# Patient Record
Sex: Male | Born: 1940 | Race: White | Hispanic: No | Marital: Married | Smoking: Former smoker
Health system: Southern US, Community
[De-identification: ages and names within clinical notes are randomized; demographics above are authoritative.]

## PROBLEM LIST (undated history)

## (undated) DIAGNOSIS — E785 Hyperlipidemia, unspecified: Secondary | ICD-10-CM

## (undated) DIAGNOSIS — I10 Essential (primary) hypertension: Secondary | ICD-10-CM

## (undated) DIAGNOSIS — I219 Acute myocardial infarction, unspecified: Secondary | ICD-10-CM

## (undated) DIAGNOSIS — I2510 Atherosclerotic heart disease of native coronary artery without angina pectoris: Secondary | ICD-10-CM

## (undated) DIAGNOSIS — IMO0001 Reserved for inherently not codable concepts without codable children: Secondary | ICD-10-CM

## (undated) DIAGNOSIS — Z531 Procedure and treatment not carried out because of patient's decision for reasons of belief and group pressure: Secondary | ICD-10-CM

## (undated) DIAGNOSIS — Z789 Other specified health status: Secondary | ICD-10-CM

## (undated) DIAGNOSIS — K56600 Partial intestinal obstruction, unspecified as to cause: Secondary | ICD-10-CM

## (undated) DIAGNOSIS — H353110 Nonexudative age-related macular degeneration, right eye, stage unspecified: Secondary | ICD-10-CM

## (undated) DIAGNOSIS — I639 Cerebral infarction, unspecified: Secondary | ICD-10-CM

## (undated) DIAGNOSIS — M1990 Unspecified osteoarthritis, unspecified site: Secondary | ICD-10-CM

## (undated) HISTORY — DX: Atherosclerotic heart disease of native coronary artery without angina pectoris: I25.10

## (undated) HISTORY — DX: Hyperlipidemia, unspecified: E78.5

## (undated) HISTORY — PX: TONSILLECTOMY: SUR1361

## (undated) HISTORY — DX: Essential (primary) hypertension: I10

## (undated) HISTORY — PX: APPENDECTOMY: SHX54

## (undated) HISTORY — DX: Other specified health status: Z78.9

---

## 1998-02-24 DIAGNOSIS — I219 Acute myocardial infarction, unspecified: Secondary | ICD-10-CM

## 1998-02-24 HISTORY — DX: Acute myocardial infarction, unspecified: I21.9

## 1998-10-16 ENCOUNTER — Inpatient Hospital Stay (HOSPITAL_COMMUNITY): Admission: EM | Admit: 1998-10-16 | Discharge: 1998-10-19 | Payer: Self-pay | Admitting: Cardiology

## 1999-03-07 ENCOUNTER — Encounter: Payer: Self-pay | Admitting: Cardiology

## 1999-03-07 ENCOUNTER — Ambulatory Visit (HOSPITAL_COMMUNITY): Admission: RE | Admit: 1999-03-07 | Discharge: 1999-03-07 | Payer: Self-pay | Admitting: Cardiology

## 1999-03-09 ENCOUNTER — Inpatient Hospital Stay (HOSPITAL_COMMUNITY): Admission: EM | Admit: 1999-03-09 | Discharge: 1999-03-12 | Payer: Self-pay | Admitting: Emergency Medicine

## 1999-03-09 ENCOUNTER — Encounter: Payer: Self-pay | Admitting: Emergency Medicine

## 2000-01-06 ENCOUNTER — Inpatient Hospital Stay (HOSPITAL_COMMUNITY): Admission: AD | Admit: 2000-01-06 | Discharge: 2000-01-11 | Payer: Self-pay | Admitting: Internal Medicine

## 2000-01-06 ENCOUNTER — Encounter: Payer: Self-pay | Admitting: Internal Medicine

## 2000-01-08 ENCOUNTER — Encounter: Payer: Self-pay | Admitting: Internal Medicine

## 2000-01-09 ENCOUNTER — Encounter: Payer: Self-pay | Admitting: *Deleted

## 2000-04-06 ENCOUNTER — Inpatient Hospital Stay (HOSPITAL_COMMUNITY): Admission: AD | Admit: 2000-04-06 | Discharge: 2000-04-07 | Payer: Self-pay | Admitting: Cardiovascular Disease

## 2003-02-25 HISTORY — PX: COLONOSCOPY W/ BIOPSIES AND POLYPECTOMY: SHX1376

## 2004-01-30 ENCOUNTER — Ambulatory Visit (HOSPITAL_COMMUNITY): Admission: RE | Admit: 2004-01-30 | Discharge: 2004-01-30 | Payer: Self-pay | Admitting: Internal Medicine

## 2004-01-30 ENCOUNTER — Ambulatory Visit: Payer: Self-pay | Admitting: Internal Medicine

## 2004-02-28 ENCOUNTER — Ambulatory Visit: Payer: Self-pay | Admitting: Cardiology

## 2004-03-18 ENCOUNTER — Ambulatory Visit: Payer: Self-pay | Admitting: Cardiology

## 2004-10-14 ENCOUNTER — Ambulatory Visit: Payer: Self-pay | Admitting: Cardiology

## 2005-05-20 ENCOUNTER — Ambulatory Visit: Payer: Self-pay | Admitting: *Deleted

## 2005-12-08 ENCOUNTER — Emergency Department (HOSPITAL_COMMUNITY): Admission: EM | Admit: 2005-12-08 | Discharge: 2005-12-08 | Payer: Self-pay | Admitting: Emergency Medicine

## 2006-01-07 ENCOUNTER — Ambulatory Visit: Payer: Self-pay | Admitting: Cardiovascular Disease

## 2006-01-20 ENCOUNTER — Ambulatory Visit: Payer: Self-pay | Admitting: Cardiovascular Disease

## 2006-01-20 ENCOUNTER — Encounter (HOSPITAL_COMMUNITY): Admission: RE | Admit: 2006-01-20 | Discharge: 2006-02-19 | Payer: Self-pay | Admitting: Cardiovascular Disease

## 2006-06-30 ENCOUNTER — Ambulatory Visit: Payer: Self-pay | Admitting: Cardiovascular Disease

## 2007-01-06 ENCOUNTER — Ambulatory Visit: Payer: Self-pay | Admitting: Cardiovascular Disease

## 2008-01-05 ENCOUNTER — Ambulatory Visit: Payer: Self-pay | Admitting: Cardiology

## 2008-07-17 DIAGNOSIS — E785 Hyperlipidemia, unspecified: Secondary | ICD-10-CM | POA: Insufficient documentation

## 2008-07-18 ENCOUNTER — Encounter (INDEPENDENT_AMBULATORY_CARE_PROVIDER_SITE_OTHER): Payer: Self-pay | Admitting: *Deleted

## 2008-07-18 LAB — CONVERTED CEMR LAB
ALT: 18 units/L
Bilirubin, Direct: 0.2 mg/dL
Cholesterol: 152 mg/dL
Glucose, Bld: 115 mg/dL
HDL: 39 mg/dL
Triglycerides: 141 mg/dL

## 2008-07-19 ENCOUNTER — Ambulatory Visit: Payer: Self-pay | Admitting: Cardiology

## 2009-01-09 ENCOUNTER — Encounter (INDEPENDENT_AMBULATORY_CARE_PROVIDER_SITE_OTHER): Payer: Self-pay | Admitting: *Deleted

## 2009-01-09 ENCOUNTER — Ambulatory Visit: Payer: Self-pay | Admitting: Cardiology

## 2009-01-09 DIAGNOSIS — I10 Essential (primary) hypertension: Secondary | ICD-10-CM

## 2009-01-09 DIAGNOSIS — I2510 Atherosclerotic heart disease of native coronary artery without angina pectoris: Secondary | ICD-10-CM

## 2009-03-02 ENCOUNTER — Encounter (INDEPENDENT_AMBULATORY_CARE_PROVIDER_SITE_OTHER): Payer: Self-pay

## 2009-03-14 ENCOUNTER — Encounter: Payer: Self-pay | Admitting: Gastroenterology

## 2009-03-15 ENCOUNTER — Ambulatory Visit: Payer: Self-pay | Admitting: Gastroenterology

## 2009-03-15 ENCOUNTER — Ambulatory Visit (HOSPITAL_COMMUNITY): Admission: RE | Admit: 2009-03-15 | Discharge: 2009-03-15 | Payer: Self-pay | Admitting: Gastroenterology

## 2009-07-17 ENCOUNTER — Encounter: Payer: Self-pay | Admitting: Cardiology

## 2009-07-24 ENCOUNTER — Ambulatory Visit: Payer: Self-pay | Admitting: Cardiology

## 2009-07-25 ENCOUNTER — Encounter: Payer: Self-pay | Admitting: Cardiology

## 2010-01-31 ENCOUNTER — Ambulatory Visit: Payer: Self-pay | Admitting: Cardiology

## 2010-01-31 ENCOUNTER — Encounter: Payer: Self-pay | Admitting: Adult Health

## 2010-03-26 NOTE — Assessment & Plan Note (Signed)
Summary: Z6X      Allergies Added: ! PCN  Visit Type:  6 month follow up Primary Provider:  Dr. Catalina Pizza   History of Present Illness: [name] [name] is a very friendly 70 y/o CM we are following with known history of stent to the third diagonal, LAD 2002; f/u stress myoview 2007 which was negative for ischemia, hypertension, hypercholesterolemia who is here for 6 month follow-up.  He is without complaints of angina.  He remains active and has not required the use of NTG.  He states he feels great.  He is followed by Dr. Margo Aye for cholesterol status.  Current Medications (verified): 1)  Aspirin Ec 325 Mg Tbec (Aspirin) .... Take One Tablet By Mouth Daily 2)  Cozaar 50 Mg Tabs (Losartan Potassium) .... Take One Tablet By Mouth Daily 3)  Pravastatin Sodium 40 Mg Tabs (Pravastatin Sodium) .... Take One Tablet By Mouth Daily At Bedtime 4)  Metoprolol Tartrate 50 Mg Tabs (Metoprolol Tartrate) .... Take 1/2 Tab Two Times A Day 5)  Multivitamins  Tabs (Multiple Vitamin) .... Take 1 Tablet By Mouth Once A Day 6)  Fibertab 625 Mg Tabs (Calcium Polycarbophil) .... 2 Tablets By Mouth Daily  Allergies (verified): 1)  ! Pcn  Vital Signs:  Patient profile:   70 year old male Height:      69 inches Weight:      188 pounds BMI:     27.86 O2 Sat:      96 % on Room air Temp:     97.3 degrees F oral Pulse rate:   56 / minute BP sitting:   121 / 67  (left arm)  Vitals Entered By: Dreama Saa, CNA (January 31, 2010 1:04 PM)  O2 Flow:  Room air   Impression & Recommendations:  Problem # 1:  ESSENTIAL HYPERTENSION, BENIGN (ICD-401.1) Well controlled.  No medication changes. His updated medication list for this problem includes:    Aspirin Ec 325 Mg Tbec (Aspirin) .Marland Kitchen... Take one tablet by mouth daily    Cozaar 50 Mg Tabs (Losartan potassium) .Marland Kitchen... Take one tablet by mouth daily    Metoprolol Tartrate 50 Mg Tabs (Metoprolol tartrate) .Marland Kitchen... Take 1/2 tab two times a day  Problem # 2:  CORONARY  ATHEROSCLEROSIS NATIVE CORONARY ARTERY (ICD-414.01) He is asymptomatic.  Remains active.   His updated medication list for this problem includes:    Aspirin Ec 325 Mg Tbec (Aspirin) .Marland Kitchen... Take one tablet by mouth daily    Metoprolol Tartrate 50 Mg Tabs (Metoprolol tartrate) .Marland Kitchen... Take 1/2 tab two times a day  Patient Instructions: 1)  Your physician recommends that you schedule a follow-up appointment in: 6 months

## 2010-03-26 NOTE — Letter (Signed)
Summary: LABS 05-14-09  LABS 05-14-09   Imported By: Faythe Ghee 07/17/2009 13:45:04  _____________________________________________________________________  External Attachment:    Type:   Image     Comment:   External Document  Appended Document: LABS 05-14-09 Reviewed.

## 2010-03-26 NOTE — Letter (Signed)
Summary: Recall, Screening Colonoscopy Only  [facility]  [address]   [place], [place] [address]   Phone: [phone]  Fax: [phone]    March 02, 2009  [name] [name] [address] [place], [place]  [address] 1940/06/05   Dear [name],   Our records indicate it is time to schedule your colonoscopy.    Please call our office at [phone] and ask for the nurse.   Thank you,  Hendricks Limes, LPN Cloria Spring, LPN  [facility] Ph: [phone]   Fax: [phone]

## 2010-03-26 NOTE — Letter (Signed)
Summary: TRIAGE  TRIAGE   Imported By: Diana Eves 03/14/2009 13:47:13  _____________________________________________________________________  External Attachment:    Type:   Image     Comment:   External Document

## 2010-03-26 NOTE — Assessment & Plan Note (Signed)
Summary: F6E      Allergies Added: NKDA  Visit Type:  Follow-up Primary Provider:  Dr. Catalina Pizza   History of Present Illness: 70 year old male presents for followup. He denies any interval development of angina or unusual breathlessness. He reports compliance with medications.  Followup labs from March 2011 revealed potassium 4.6, BUN 20, creatinine 0.8, AST 15, ALT 12, total cholesterol 146, triglycerides 89, HDL 40, LDL 88.  He remains comfortable with observation only at this point, and does not want to pursue any followup ischemic testing, unless he develops progressive symptoms. I have discussed this with him with him before.   Current Medications (verified): 1)  Aspirin Ec 325 Mg Tbec (Aspirin) .... Take One Tablet By Mouth Daily 2)  Cozaar 50 Mg Tabs (Losartan Potassium) .... Take One Tablet By Mouth Daily 3)  Pravastatin Sodium 40 Mg Tabs (Pravastatin Sodium) .... Take One Tablet By Mouth Daily At Bedtime 4)  Metoprolol Tartrate 50 Mg Tabs (Metoprolol Tartrate) .... Take 1/2 Tab Two Times A Day 5)  Multivitamins  Tabs (Multiple Vitamin) .... Take 1 Tablet By Mouth Once A Day 6)  Fibertab 625 Mg Tabs (Calcium Polycarbophil) .... 2 Tablets By Mouth Daily  Allergies (verified): No Known Drug Allergies  Past History:  Past Medical History: Last updated: 01/09/2009 CAD - PTCA/stent to LAD and diagonal 2001, LVEF 55% Hyperlipidemia Hypertension ACE inhibitor intolerance related to cough  Social History: Last updated: 01/09/2009 Retired - Medical laboratory scientific officer Divorced  Tobacco Use - No.   Review of Systems  The patient denies anorexia, fever, chest pain, syncope, dyspnea on exertion, peripheral edema, prolonged cough, headaches, melena, hematochezia, and severe indigestion/heartburn.         Otherwise reviewed and negative.  Vital Signs:  Patient profile:   70 year old male Weight:      179 pounds Pulse rate:   56 / minute BP sitting:   120 / 70  (right arm)  Vitals  Entered By: Dreama Saa, CNA (Jul 24, 2009 2:36 PM)  Physical Exam  Additional Exam:  Normally nourished appearing male in no acute distress. HEENT: Conjunctiva and lids normal, oropharynx with moist mucosa. Neck: Supple, no carotid bruits, no thyromegaly. Lungs: Clear to auscultation, nonlabored. Cardiac: Regular rate and rhythm, no significant systolic murmur or S3 gallop. Abdomen: Soft, no hepatomegaly, bowel sounds present. Skin: Warm and dry. Extremities: No pitting edema. Distal pulses 2+.   EKG  Procedure date:  07/24/2009  Findings:      Sinus bradycardia at 53 beats per minutes with decreased R-wave progression consistent with old anterior infarct, incomplete right bundle branch block pattern.  Impression & Recommendations:  Problem # 1:  CORONARY ATHEROSCLEROSIS NATIVE CORONARY ARTERY (ICD-414.01)  Symptomatically stable on medical therapy. Mr. Sanroman prefers observation only at this time. We will keep 6 month followup visits for now, although I have asked him to let me know sooner if he develops any interval angina or unusual shortness of breath.  His updated medication list for this problem includes:    Aspirin Ec 325 Mg Tbec (Aspirin) .Marland Kitchen... Take one tablet by mouth daily    Metoprolol Tartrate 50 Mg Tabs (Metoprolol tartrate) .Marland Kitchen... Take 1/2 tab two times a day  Problem # 2:  ESSENTIAL HYPERTENSION, BENIGN (ICD-401.1)  Blood pressure well controlled today.  His updated medication list for this problem includes:    Aspirin Ec 325 Mg Tbec (Aspirin) .Marland Kitchen... Take one tablet by mouth daily    Cozaar 50 Mg Tabs (Losartan  potassium) .Marland Kitchen... Take one tablet by mouth daily    Metoprolol Tartrate 50 Mg Tabs (Metoprolol tartrate) .Marland Kitchen... Take 1/2 tab two times a day  His updated medication list for this problem includes:    Aspirin Ec 325 Mg Tbec (Aspirin) .Marland Kitchen... Take one tablet by mouth daily    Cozaar 50 Mg Tabs (Losartan potassium) .Marland Kitchen... Take one tablet by mouth daily     Metoprolol Tartrate 50 Mg Tabs (Metoprolol tartrate) .Marland Kitchen... Take 1/2 tab two times a day  Problem # 3:  DYSLIPIDEMIA (ICD-272.4)  Lipid numbers look reasonable.  His updated medication list for this problem includes:    Pravastatin Sodium 40 Mg Tabs (Pravastatin sodium) .Marland Kitchen... Take one tablet by mouth daily at bedtime  Patient Instructions: 1)  Your physician recommends that you schedule a follow-up appointment in: 6 months 2)  Your physician recommends that you continue on your current medications as directed. Please refer to the Current Medication list given to you today.

## 2010-07-09 NOTE — Assessment & Plan Note (Signed)
[facility]                        CARDIOLOGY OFFICE NOTE   NAME:[name] [name], [name] [name]                      MRN:          [id number]  DATE:07/19/2008                            DOB:          Oct 31, 1940    PRIMARY CARE PHYSICIAN:  [name] A. Gerda Diss, MD   REASON FOR VISIT:  Scheduled followup.   HISTORY OF PRESENT ILLNESS:  I saw [name] [name] back in November.  He is  doing very well.  He states that he is continuing to walk and not having  any problems with angina or breathlessness.  Today's electrocardiogram  shows sinus bradycardia with decreased R-wave progression which is old.  I reviewed his medications today which are unchanged.  He did have  recent labs done showing an LDL of 85.  Random glucose was 115.  We  talked about low-carbohydrate, low simple sugar diet, and perhaps  increasing his walking regiment some.  Otherwise, his liver function  tests were normal.   ALLERGIES:  PENICILLIN.   MEDICATIONS:  1. Aspirin 325 mg p.o. daily.  2. Cozaar 50 mg p.o. daily.  3. Pravastatin 40 mg p.o. daily.  4. Lopressor 25 mg p.o. b.i.d.  5. Sublingual nitroglycerin 0.4 mg p.r.n. (has not needed).   REVIEW OF SYSTEMS:  Outlined above.  No claudication symptoms.  Otherwise, reviewed negative.   PHYSICAL EXAMINATION:  VITAL SIGNS:  Blood pressure 114/64, heart rate  66, weight is 189 pounds down from 195.  GENERAL:  The patient is comfortable in no acute distress.  NECK:  No elevated jugular venous pressure.  No loud carotid bruits.  LUNGS:  Clear without breathing.  CARDIAC:  Regular rate and rhythm.  No S3 gallop.  Soft basal systolic  murmur.  ABDOMEN:  Soft.  EXTREMITIES:  Exhibit no significant pitting edema.   IMPRESSION AND RECOMMENDATIONS:  1. Coronary artery disease, status post previous intervention of the      left anterior descending and diagonal branch back in 2001.  He is      symptomatically quite stable at this point.  He had a  nonischemic      Myoview in 2007.  We will plan to continue medical therapy and      symptom observation over the next 6 months.  2. Hyperlipidemia, LDL of 85, on pravastatin.  Liver function tests      were normal.  3. Hypertension, well controlled today.     [name] Sidle, MD  Electronically Signed    SGM/MedQ  DD: 07/19/2008  DT: 07/20/2008  Job #: [id number]   cc:   [name] Picket A. Gerda Diss, MD

## 2010-07-09 NOTE — Assessment & Plan Note (Signed)
[facility]                       [place] CARDIOLOGY OFFICE NOTE   NAME:[name] [name], [name] [name]                      MRN:          [id number]  DATE:01/05/2008                            DOB:          03/14/1940    PRIMARY CARE PHYSICIAN:  Scott A. Gerda Diss, MD.   REASON FOR VISIT:  Cardiac followup.   HISTORY OF PRESENT ILLNESS:  Mr. Harshfield was last seen by Dr. Eden Emms back  in November 2008.  He has a history of coronary artery disease, status  post angioplasty and stent placement in the left anterior descending and  second diagonal branch in 2001.  He also had moderate disease involving  a right coronary artery.  She had a very reassuring Myoview done in  November 2007 demonstrating no active ischemia or scar with an ejection  fraction of 55% and from a clinical perspective is doing very well  without angina or breathlessness.  He has not had recent lipids and is  due to see his primary care physician tomorrow for some followup lab  work.  He is tolerating well the medications outlined below.   ALLERGIES:  PENICILLIN.   PRESENT MEDICATIONS:  1. Aspirin 325 mg p.o. daily.  2. Cozaar 50 mg p.o. daily.  3. Pravastatin 40 mg p.o. daily.  4. Metoprolol 50 mg one-half tablet p.o. b.i.d.  5. Nitroglycerin 0.4 mg sublingual p.r.n.   REVIEW OF SYSTEMS:  As described in history of present illness.  No  claudication, palpitation, or syncope; otherwise negative.   PHYSICAL EXAMINATION:  VITAL SIGNS:  Blood pressure is 126/78, heart  rate is 68, and weight is 195 pounds.  GENERAL:  The patient is comfortable, in no acute distress.  NECK:  No elevated jugular venous pressure.  No loud bruits or  thyromegaly.  LUNGS:  Clear on breathing at rest.  CARDIAC:  Regular rate and rhythm.  No S3, gallop, or pathologic murmur.  He does have a soft systolic ejection murmur.  ABDOMEN:  Soft,  nontender.  EXTREMITIES:  Exhibit no significant pitting edema.   IMPRESSION  AND RECOMMENDATIONS:  1. Cardiovascular disease, status post previous intervention to the      left anterior descending and diagonal back in 2001 with a      nonischemic Myoview in 2007.  He is doing very well symptomatically      on medical therapy.  I will plan to see him back in 6 months.  2. Hyperlipidemia, on pravastatin.  LDL was 78 back in 2007.  He is      due for followup labs with      Dr. Gerda Diss in the near future.  His goal LDL should remain around      70.  3. Hypertension, well controlled today.  He is tolerating Cozaar.     Jonelle Sidle, MD  Electronically Signed    SGM/MedQ  DD: 01/05/2008  DT: 01/06/2008  Job #: [id number]   cc:   Lorin Picket A. Gerda Diss, MD

## 2010-07-09 NOTE — Assessment & Plan Note (Signed)
[facility]                       [place] CARDIOLOGY OFFICE NOTE   NAME:[name] [name], [name] [name]                      MRN:          [id number]  DATE:01/06/2007                            DOB:          02-27-1940    [name] [name] returns today for followup.  He is doing fairly well.  He has not  had any significant chest pain, PND, or orthopnea.  He has a history of  coronary artery disease with previous angioplasty and stenting of the  LAD in 2000 and 2001.   His last Myoview in December, 2007 was nonischemic with an EF of 55%.  He had been complaining about the cost of his Cozaar before I tried to  switch him to Benazepril, and he got a cough.  This had to be stopped by  Dr. Gerda Diss.   Since that time, he apparently has had some talks with Medicare part D  and was able to get his Cozaar for $5.60.   His review of systems is otherwise negative.  He is relieved.  He is  finally getting a divorce from his wife, who apparently was running  around on him quite a bit.   CURRENT MEDICATIONS:  1. Aspirin daily.  2. Cozaar 50 daily.  3. Pravastatin 40 a day.  4. Metoprolol 25 b.i.d.   PHYSICAL EXAMINATION:  Remarkable for a jovial elderly white male in no  distress.  Weight is 185.  Blood pressure is 130/70.  Pulse is 68 and regular.  Afebrile.  Respiratory rate 14.  HEENT:  Unremarkable.  Carotids are normal without bruit.  No lymphadenopathy, thyromegaly, or  JVP elevation.  LUNGS:  Clear with good diaphragmatic motion.  No wheezing.  CARDIAC:  S1 and S2, normal heart sounds, no murmur.  PMI normal.  ABDOMEN:  Benign.  Bowel sounds positive.  No hepatosplenomegaly or  hepatojugular reflux.  Distal pulses are intact with no edema.  NEURO:  Nonfocal.  SKIN:  Warm and dry.  No muscular weakness.   IMPRESSION:  1. Previous coronary artery disease with stenting to the left anterior      descending artery, nonischemic Myoview in 2007.  No chest pain.  Continue aspirin and beta blocker.  2. Risk factor modification, hypercholesterolemia.  Continue      pravastatin 40 mg a day.  Last LDL was 78 with normal LFTs.  3. Hypertension:  Continue Cozaar.  Appears to have a cough with ACE      inhibitors.  Continue low salt diet.  Can always increase Cozaar to      100 a day if needed.   Overall, I think [name] [name] is doing well, and I congratulated him on  resolving some of the issues with his estranged wife.     Noralyn Pick. Eden Emms, MD, [facility]  Electronically Signed    PCN/MedQ  DD: 01/06/2007  DT: 01/07/2007  Job #: [id number]

## 2010-07-12 NOTE — Assessment & Plan Note (Signed)
HEALTHCARE                         [place] CARDIOLOGY OFFICE NOTE   NAME:[name], [name] GASPARINI                      MRN:          [id number]  DATE:01/07/2006                            DOB:          Nov 14, 1940    [name] [name] is seen as a new patient by me.  He has previously been followed  by Dr. Dorethea Clan.  He has a history of coronary artery disease, with  angioplasty and stenting of his LAD and second diagonal branch in 2001.  His  EF was thought to be in the 40% range.  He has also had some in-stent  restenosis of the LAD.  The patient was noted also to have a 60%-70% right  coronary artery lesion.   His last catheterization in 2002 showed an EF of 53%, with patent stents.   The patient has not had a stress test in over four years.   REVIEW OF SYSTEMS:  Remarkable for an occasional headache.  He does not have  any nitroglycerin at home.  He has not had any PND, orthopnea, syncope, or  lower extremity edema.   He is now on social security benefits.  He is active.  He used to work in a  [employer].   MEDICATIONS:  1. An aspirin a day.  2. Lipitor 20 at bedtime.  3. Cozaar 50 mg a day.   His last LDL cholesterol from December 02, 2005 was 78.   His LFTs were mildly abnormal, with a bilirubin of 1.9, indirect being  elevated at 1.6.  His enzymes themselves were negative.   He had a 2D echocardiogram in June 2005 which showed an EF of 50-55%, with  no critical valve disease.   PHYSICAL EXAMINATION:  VITAL SIGNS:  Blood pressure was equal to 110/70,  pulse 60 and regular.  HEENT:  Normal.  NECK:  There were no carotid bruits.  There is no thyromegaly.  There is no  lymphadenopathy.  LUNGS:  Clear.  There is an S1, S2, with a soft systolic ejection murmur.  ABDOMEN:  Benign.  LOWER EXTREMITIES:  Intact pulses.  No edema.  SKIN:  Warm and dry.  NEUROLOGIC:  Nonfocal.   IMPRESSION:  Coronary artery disease.  Previous stenting of the LAD and   diagonal, with moderate right disease.  Need for follow-up adenosine  Myoview.  The patient can walk for this.  I gave him prescriptions for  NitroQuick to have and told him he should get this refilled every year.  I  will see him the week after Thanksgiving to do his treadmill.   His cholesterol is fairly well controlled on Lipitor 20.  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  I think we will keep  his current statin dose the same and follow up his lipids in six months.   His blood pressure is well controlled on Cozaar 50 mg, and he has previously  had decreased LV function, which seems to have recovered some.   Overall, I think he is doing well.  Further recommendations will be based on  the results of  his Myoview.     [name] [name]. Eden Emms, MD, [facility]  Electronically Signed    PCN/MedQ  DD: 01/07/2006  DT: 01/07/2006  Job #: [id number]

## 2010-07-12 NOTE — Discharge Summary (Signed)
[place]  Patient:    [name] [name], CARTON                      MRN: [id number] Adm. Date:  [id number] Disc. Date: 01/11/00 Attending:  Pricilla Riffle Dictator:   Tereso Newcomer, P.A. CC:         Madolyn Frieze. Jens Som, M.D. [facility], [address], [place], [place] [address]             Dr. Sudie Bailey   Discharge Summary  DATE OF BIRTH:  29-Oct-1940  REASON FOR ADMISSION:  Neck and shoulder pain.  DISCHARGE DIAGNOSES: 1. Coronary artery disease. 2. History of remote tobacco abuse. 3. Hyperlipidemia. 4. 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  PROCEDURES: 1. Cardiac catheterization on January 07, 2000, by Dr. Glennon Hamilton revealing    left main normal, LAD 30% in-stent restenosis proximal LAD diagonal    reveals 70% restenosis at the ostium and also proximal circumflex normal    RCA focal 50 to 70% lesion, in distal RCA no response to intracoronary    nitroglycerin.  Left ventricle EF 65%, hypokinesis of distal anterior wall.    Abdominal aortogram normal. 2. Exercise Cardiolite on January 09, 2000, revealed inferior wall ischemia 3. Percutaneous coronary intervention on January 10, 2000, by Dr. Veneda Melter status post PTCA and stent to RCA reduction of stenosis from 70% to    0%.  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 Sudie Bailey in the office on the day of admission.  He had been having severe pain in his neck and shoulder recently.  His color got bad.  His wife gave the patient nitroglycerin the night before admission, and the pain resolved.  He went to see Dr. Sudie Bailey who sent him to the emergency room for admission. Previous cardiac ischemic pain was in his arms.  His chest pain was rated at 5/10; it had been 9/10.   Positive diaphoresis, positive shortness of breath, positive dizziness.  No nausea.  ALLERGIES:  PENICILLIN.  INITIAL PHYSICAL EXAMINATION:  GENERAL:  A 70 year old male in no acute distress.  VITAL SIGNS:  Blood pressure 145/82.  NECK:  Without bruits or JVD.  HEART:  Regular rate and rhythm, distant.  LUNGS:  Clear.  EXTREMITIES:  Without edema.  ABDOMEN:  Soft, obese, nontender.  ADMISSION LABORATORY DATA:  EKG: Sinus bradycardia with rate of 51; no new changes.  HOSPITAL COURSE:  The patient was admitted for neck and shoulder pain.  This was felt to be an anginal equivalent, and he was sent for cardiac catheterization on January 07, 2000, by Dr. Glennon Hamilton.  The results are noted above.  He tolerated the procedure well.  He had no immediate complications.  Given the RCA lesion, it was felt he should be evaluated with a Cardiolite study.  This was performed on January 09, 2000.  The results are noted above.  Given the results of ischemia in inferior wall, it was felt the patient should undergo percutaneous coronary intervention of the RCA lesion. This was performed on January 10, 2000.  He had no immediate complications and remained stable post catheterization.  On the morning of January 11, 2000, he was  without complaints, and his groin was stable without hematomas or bruits.  Therefore, it was felt he was stable enough for discharge to home.  LABORATORY DATA:  White count 5700, hemoglobin 11.5, hematocrit 30.2, platelet count 156,000.  Sodium 140, potassium 4, chloride 100, CO2 29, BUN 14, creatinine 0.8, glucose 114.  Cardiac enzymes negative x 3.  Lipid profile: Total cholesterol 145, triglycerides 62, HDL 30, LDL 103.  Total protein 6.9, albumin 4.1, AST 20, ALT 20, alkaline phosphatase 64, total bilirubin 1.6, and that was high.  Chest x-ray on January 08, 2000: Minimal peribronchial thickening with no other significant abnormality.  Cervical spine  x-ray: Degenerative changes in the mid lower cervical spine.  DISCHARGE MEDICATIONS: 1. Plavix 75 mg q.d. x 1 month. 2. Coated aspirin 325 mg q.d. 3. PROVE-IT study drug q.d. 4. Cozaar 25 mg q.d. 5. Lopressor 25 mg b.i.d. 6. Nitroglycerin 0.4 mg sublingual p.r.n. chest pain.  ACTIVITY:  No driving, heavy lifting, exertion, or sex for three days.  DIET:  Low-fat, low-salt diet.  WOUND CARE:  The patient should watch his groin for increased swelling, bleeding or bruising.  Call office with concerns.  He is to return back to work on Monday with no heavy lifting.  The PROVE-IT study drug will be started next week, and research nurse will call the patient and make arrangements.  FOLLOWUP:  He is to follow up with Dr. Jens Som in two weeks in [place]. DD:  01/11/00 TD:  01/11/00 Job: [id number]

## 2010-07-12 NOTE — Cardiovascular Report (Signed)
[place]. [facility]  Patient:    [name] [name]                       MRN: [id number] Proc. Date: 03/07/99 Adm. Date:  [id number] Attending:  Lenoria Farrier CC:         Veneda Melter, M.D. [facility]             Madolyn Frieze. Jens Som, M.D. [facility]             Butch Penny, M.D.             Bruce Elvera Lennox Juanda Chance, M.D. [facility]                        Cardiac Catheterization  PROCEDURES PERFORMED: 1. Left heart catheterization. 2. Right heart catheterization. 3. Left ventriculogram. 4. Intravascular ultrasound of the left anterior descending artery. 5. Percutaneous transluminal coronary angioplasty of the left anterior descending    artery. 6. Percutaneous transluminal coronary angioplasty of the second diagonal    branch of the left anterior descending artery. 7. Perclose suture closure, right femoral artery.  DIAGNOSES: 1. 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 2. Mild left ventricular systolic dysfunction. 3. Normal right heart pressures.  HISTORY:  Mr. 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 The patient underwent insertion of a stent in the mid LAD at the takeoff of a second diagonal branch.  At that time, he had anterior wall hypokinesis.  The patient recovered nicely, however, he had a recurrent episode of discomfort and  subsequently underwent a Cardiolite stress test.  This showed scarring in the distal, anterior, and apical walls with a moderate amount of peri-infarct ischemia.  He presents now for right and left heart catheterization.  TECHNIQUE:  After informed consent was obtained, the patient was brought to the  catheterization laboratory where both groins were sterilely prepped and draped.  One percent Lidocaine was used to infiltrate the right groin.  An 8-French and  6-French arterial sheath were placed using the modified  Seldinger technique.  A  7.5-French PA catheter was then advanced via the right femoral vein into the pulmonary artery and appropriate right-sided hemodynamics were obtained.  A 6-French pigtail catheter was advanced into the left ventricle and appropriate left-sided hemodynamics were obtained.  A left ventriculogram was then performed using power injections of contrast.  The pigtail catheter was removed and 6-French JL-4 and JR-4 catheters were used to engage the left and right coronary arteries. Selective angiography was performed in various projection using manual injections of contrast.  The initial findings are as follows.  INITIAL FINDINGS:  HEMODYNAMICS:  R 8, RV 21/8, PA 21/13, pulmonary capillary wedge pressure is 12. LV 112/10, aortic 113/53, LV EDP 20.  Cardiac output 4.7 L/min by thermodilution, cardiac index of 2.44 L/min/m sq.  Aortic saturation 95%, PA saturation 77.4%.  Cardiac output 6.0 L/min by Fick, cardiac index 3.1 L/min/m sq.  Mitral valve gradient is 3.3 mmHg.  CORONARY ANGIOGRAPHY: 1. Left main trunk:  Mild irregularities with narrowings not greater than 20%.  2. Left anterior descending artery:  This is a large caliber vessel that provides    two diagonal branches in the proximal segment.  The first diagonal branch is a    trivial sized vessel with an ostial narrowing of 90%.  The second diagonal    branch  is a medium caliber vessel that has mild diffuse disease in the ostium    and proximal segment of approximately 70%.  The LAD is a large caliber vessel    that has mild diffuse disease in the proximal segment with narrowings of 30%.    There is a hazy narrowing of 70% in the mid section of the LAD within the distal    segment of the previously placed stent.  This is at the takeoff of the second    diagonal branch.  The distal LAD has mild irregularities.  3. Left circumflex artery:  This is a medium caliber vessel that provides a large     first marginal branch in the mid section and a smaller second marginal branch    distally.  There are mild irregularities in the left circumflex system.  4. Right coronary artery:  The RCA is dominant.  It is a large caliber vessel that    provides a posterior descending artery as well as several posteroventricular    branches in its terminal segment.  There is a moderate narrowing of    approximately 70% in the distal RCA prior to the takeoff of the posterior    descending artery.  The remainder of the RCA has mild diffuse irregularities.  LEFT VENTRICULOGRAM:  Normal end-systolic and systolic dimensions.  Overall, left ventricular function is mildly impaired.  Ejection fraction is approximately 40%. There is severe hypokinesis of the mid and distal anterior wall and apex. There was no mitral regurgitation.  Upon review of these findings, it was felt that further assessment of the mid LAD was indicated and we prepared for intravascular ultrasound.  INTRAVASCULAR ULTRASOUND:  The patient was given 3000 units of heparin intravenously and a 6-French JL-4 guide catheter was used to engage the left coronary artery.  A 0.014 inch short high-torque floppy wire was advanced into he distal LAD and intravascular ultrasound of the LAD was performed using automated pullback.  This showed that the LAD was a large caliber vessel in the distal segment of at least 3 mm.  Within the distal segment of the stent, the vessel was a 4 mm vessel and proximally it is 4.5 mm vessel.  The stent appears to be dilated to just under 3 mm with a lumen of approximately 2.2 mm in the tightness segment in the distal area of the stenting near the takeoff of the second diagonal branch.  This was felt to be a significant narrowing and preparation was made for percutaneous intervention.  INCOMPLETE DICTATION DD:  03/07/99 TD:  03/07/99 Job: [id number]

## 2010-07-12 NOTE — Op Note (Signed)
[name] [name], [name] [name]               ACCOUNT NO.:  [id number]   MEDICAL RECORD NO.:  [id number]          PATIENT TYPE:  AMB   LOCATION:  DAY                           FACILITY:  [facility]   PHYSICIAN:  Lionel December, M.D.    DATE OF BIRTH:  [birth year]   DATE OF PROCEDURE:  01/29/2004  DATE OF DISCHARGE:                                 OPERATIVE REPORT   ADDENDUM:  I just dictated a report on [name].  It was a colonoscopy  report.  Please note that a copy needs to go to W. Simone Curia, M.D., and  not Dr. Sudie Bailey.     [name]   NR/MEDQ  D:  01/30/2004  T:  01/30/2004  Job:  [id number]

## 2010-07-12 NOTE — Cardiovascular Report (Signed)
[place]. [facility]  Patient:    [name] [name], [name] [name]                      MRN: [id number] Proc. Date: 04/07/00 Adm. Date:  [id number] Disc. Date: [id number] Attending:  Talitha Givens CC:         Cardiac Catheterization Laboratory  Gareth Morgan, M.D. [facility], [place], [place]  Madolyn Frieze. Crenshaw, M.D. [facility]   Cardiac Catheterization  PROCEDURE:  Left heart catheterization with coronary angiography, and left ventriculography.  CARDIOLOGIST:  Daisey Must, M.D.  INDICATIONS:  [name] is a 70 year old male who has a history of previous anterior wall myocardial infarction, treated by Dr. Everardo Beals. [place] with stent placement in the proximal left anterior descending coronary artery.  More recently in November 2001, he underwent a stent placement in the distal right coronary artery.  He presented with recurrent symptoms, similar to his prior angina, and was referred for a cardiac catheterization.  DESCRIPTION OF PROCEDURE:  A 6-French sheath was placed in the right femoral artery.  The standard 6-French catheters were utilized.  Contrast was Omnipaque.  There were no complications.  RESULTS: HEMODYNAMICS: Left ventricular pressure:  130/15. Aortic pressure:  130/70.  There is no aortic valve gradient.  LEFT VENTRICULOGRAM:  There is moderate hypokinesis of the anterior wall and very mild akinesis of the apex.  The ejection fraction was calculated at 53%. There is trace mitral regurgitation.  CORONARY ARTERIOGRAPHY:  (Right dominant): 1. Left main coronary artery:  The left main coronary artery is normal. 2. Left anterior descending coronary artery:  The left anterior descending    coronary artery has a 30% stenosis at its ostium.  There is a stent    extending from the proximal to mid-LAD.  In the proximal portion of the    stent is a 30% stenosis.  Very small first and second diagonal branches    arise from within the stent, as well as a  normal-sized third diagonal    branch which arises from within the distal aspect of the stent.  The    second diagonal which is a very small vessel, has an 80% stenosis at    its origin.  The third diagonal has a 70% stenosis at its origin, and    a 70% stenosis in the midbody. 3. Left circumflex coronary artery:  The left circumflex artery gives rise    to a very large obtuse marginal-I and a small obtuse marginal-II.  The    OM-I has minor luminal irregularities. 4. Right coronary artery:  The right coronary artery is a dominant vessel,    giving rise to a normal-sized posterior descending coronary artery,    a small first posterolateral branch, and a normal-sized second    posterolateral branch.  There is a 20% stenosis in the mid-right    coronary artery, a 20% in the distal right coronary artery.  There is    a stent in the distal right coronary artery, with less than 10% stenosis    within the stent.  IMPRESSION: 1. Mildly decreased left ventricular systolic function, secondary to    prior anterior wall myocardial infarction. 2. Mild coronary artery disease involving the left anterior descending    coronary artery and the right coronary artery, with patent stents    in both vessels.  There is a 70% stenosis in the origin of a third    diagonal which arises from within the  previously-placed stent.  This    appears to be of borderline severity.  RECOMMENDATIONS:  Medical therapy.DD:  04/07/00 TD:  04/08/00 Job: [id number]

## 2010-07-12 NOTE — Op Note (Signed)
NAMEJOREN, [name]               ACCOUNT NO.:  [id number]   MEDICAL RECORD NO.:  [id number]          PATIENT TYPE:  AMB   LOCATION:  DAY                           FACILITY:  [facility]   PHYSICIAN:  [name] [name], M.D.    DATE OF BIRTH:  Jul 29, 1940   DATE OF PROCEDURE:  01/30/2004  DATE OF DISCHARGE:                                 OPERATIVE REPORT   PROCEDURE:  Total colonoscopy with polypectomy.   INDICATIONS:  [name] [name] is a 70 year old Caucasian male who is here for  screening colonoscopy. Family history is negative for colorectal carcinoma  but positive for other malignancies. One brother died of leukemia at age 14.  Mother and sister had breast carcinoma. Procedure and risks were reviewed  with the patient and informed consent was obtained.   PREOPERATIVE MEDICATIONS:  Demerol 50 mg IV, Versed 4 mg IV in divided  doses.   FINDINGS:  Procedure performed in endoscopy suite. The patient's vital signs  and O2 saturations were monitored during procedure and remained stable. The  patient was placed in the left lateral position and rectal examination  performed. No abnormality noted on external or digital exam. Olympus video  scope was placed in the rectum and advanced into sigmoid colon and beyond.  Preparation was excellent. Scope was advanced to the cecum which was  identified by appendiceal orifice and ileocecal valve. Picture taken for the  record. As the scope was withdrawn, colonic mucosa was carefully examined.  There were no abnormalities except in the rectum there was an 8-mm sessile  polyp which was snared and retrieved for histological examination. During  this process, it broke into small pieces. There was another tiny polyp in  the rectum which was coagulated using snare tip. Distal rectal mucosa and  anorectal junction were examined by retroflexing the scope. There was a  single papilla and small hemorrhoids below the dentate line. The scope was  straightened and withdrawn.  The patient tolerated the procedure well.   FINAL DIAGNOSES:  1.  An 8-mm sessile polyp snared from the rectum, and a smaller one was      coagulated.  2.  Small external hemorrhoids.   RECOMMENDATIONS:  Standard instructions given. I will be contacting patient  with biopsy results and further recommendations.     [name]   NR/MEDQ  D:  01/30/2004  T:  01/30/2004  Job:  [id number]   cc:   Mila Homer. Sudie Bailey, M.D.  [address] [place], [place] [address]  Fax: [phone]

## 2010-07-12 NOTE — Assessment & Plan Note (Signed)
[facility]                       [place] [facility] OFFICE NOTE   NAME:[name] [name]                      MRN:          [id number]  DATE:06/30/2006                            DOB:          06-Dec-1940    [name] [name] returns today for followup. He has known coronary artery disease.  He has had previous PTCA and stenting of the left anterior descending  diagonal in 2001.   His last cath in 2002 showed patent stents.  He has a non-ischemic  Myoview at the end of 2007.  His EF is preserved.   He has been doing fairly well.  Unfortunately, his wife left him after  49 years.  Apparently she had been cheating on him for quite some time.  He seems to be taking this in stride.   The patient was inquiring about changing his Cozaar to benazepril to  save 20 dollars on the co-pay.  I said this was fine.   His review of systems was remarkable for no significant chest pain, PND  or orthopnea.  No palpitations or syncope, otherwise review of systems  is negative.   His medications include:  1. An aspirin daily.  2. Cozaar 50 daily to be switched to benazepril 20 daily.  3. Pravastatin 40 daily.  4. Lopressor 25 b.i.d.   His exam is remarkable for an elderly appearing white male in no  distress.  His weight is 185.  He is afebrile.  Blood pressure is 120/60, pulse is  62 and regular. RR 12  HEENT:  Is normal.  There is no carotid bruits, no thyromegaly, no JVP elevation.  LUNGS:  Were clear.  There is no accessory muscle use and no wheezing.  There is an S1, S2 with normal heart sounds.  PMI is normal.  ABDOMEN:  Is benign.  There is no hepatosplenomegaly, no hepatojugular  reflux. There is no AAA.  Bowel sounds are positive.  There is no  organomegaly or masses.  Distal pulses are intact with no edema.  NEURO:  Is nonfocal.  There is no muscular weakness and no distal lymphadenopathy.   IMPRESSION:  1. Coronary artery disease stable without chest  pain.  Myoview non-      ischemic 5 months ago.  2. The patient has good risk factor modification.  He is on a statin      drug.  His last LDL      cholesterol was 78 with normal LFTs.  He will continue her aspirin      and beta blocker      therapy.  Blood pressure is well controlled on Cozaar.  He will let      [place] know if the benazepril is as effective.  We will see him back in      6 months.     [name] Pick. Eden Emms, [name] [name], [facility]  Electronically Signed    PCN/MedQ  DD: 06/30/2006  DT: 06/30/2006  Job #: [id number]

## 2010-07-12 NOTE — Discharge Summary (Signed)
[place]. [facility]  Patient:    [name] [name], [name] [name]                        MRN: [id number] Adm. Date:  04/06/00 Disc. Date: 04/07/00 Attending:  Luis Abed, M.D. [facility] Dictator:   Rozell Searing, P.A. CC:         Katharine Look, M.D.   Discharge Summary  REASON FOR ADMISSION:  Please refer to dictated admission note.  HOSPITAL COURSE:  The patient is a 70 year old male, with known CAD, notable for a prior anterior MI/PTCA/LAD and DX2 January of 2001 and more recently stenting of distal RCA November of 2001, who presented to [facility] for evaluation of left neck discomfort.  These episodes became recurrent, and responsive to nitroglycerin, prompting his presentation to the hospital.  He stated that they were identical to his previous episodes of angina.  Initial EKG revealed no acute changes.  Cardiac enzymes were negative.  Recommendation was to transfer directly to [facility] for relook coronary angiogram.  Cardiac catheterization, performed February 12, by Daisey Must, M.D. [facility] (see catheterization report for full details), revealed patency of the LAD and RCA stents and mild LVD (EF 53%).  Specifically, coronary anatomy notable for 30% ostial LAD, 30% proximal (in-stent); 80% DX1 (small); 70% ostial/mid DX2 (small); less than 20% OM1; 20% mid/distal RCA (less than 10% at stent site).  Dr. Gerri Spore recommended medical therapy. No medication adjustments made during this brief visit.  DISCHARGE MEDICATIONS: 1. Lipitor 10 mg q.d. 2. Cozaar 50 mg q.d. 3. Coated aspirin 325 mg q.d. 4. Lopressor 25 mg b.i.d. 5. Nitrostat p.r.n.  DISCHARGE INSTRUCTIONS:  Refrain from any heavy lifting/driving x 2 days; 1914 low fat/cholesterol diet; call the office if there is any bleeding/swelling of the groin.  The patient was due to return to Dr. Jens Som this month at our [facility]. He has been instructed to call and schedule a follow-up appointment with  them in the ensuing two-three weeks.  DISCHARGE DIAGNOSES: 1. Nonischemic chest pain.    a. Cardiac catheterization, February 12; patent left anterior descending       and right coronary artery stents.    b. Mild left ventricular dysfunction (ejection fraction 53%).    c. Status post anterior myocardial infarction, percutaneous transluminal       coronary angioplasty left anterior descending and diagonal 26 February 1999.    d. Status post stent distal right coronary artery November of 2001. 2. Dyslipidemia. 3. ACE inhibitor intolerance - cough. 4. Strong family history of premature coronary artery disease. DD:  04/07/00 TD:  04/07/00 Job: [id number]

## 2010-07-12 NOTE — Cardiovascular Report (Signed)
[place]. [facility]  Patient:    [name] [name], [name] [name]                      MRN: [id number] Proc. Date: 01/07/00 Adm. Date:  [id number] Attending:  Pricilla Riffle CC:         Gareth Morgan, M.D.  Madolyn Frieze Jens Som, M.D. [facility]  Cardiac Catheterization Laboratory   Cardiac Catheterization  INDICATIONS:  The patient is a 70 year old white male with anterior wall myocardial infarction, September 26, 1998, treated with emergency PTCA stent of the LAD.  He had followup on November 2001 and was found to have re-stenosis of the stent site and the LAD and diagonal with both dilated.  There was a 70% lesion of the RCA.  The patient now returns with somewhat atypical symptoms, pain particularly bothering him in his neck.  It was relieved by nitroglycerin after 10 minutes.  PROCEDURES:  Selective coronary angiography and left ventricular angiography, abdominal aortography--Judkins technique.  PRESSURES:  There is no gradient on pullback of the left ventricle aorta.  ANGIOGRAPHY: 1. Left main:  The left main coronary artery is normal. 2. Left anterior descending:  The left anterior descending revealed    a 30% in-stent re-stenosis in the proximal LAD.  The diagonal revealed    70% re-stenosis at the ostium and also proximal. 3. Circumflex:  The circumflex coronary is normal. 4. Right coronary artery:  The right coronary artery revealed a focal    50-70% lesion of the distal RCA.  It is not felt to have changed    significantly.  There was no response to intracoronary nitroglycerin. 5. Left ventricle:  The left ventricle revealed well preserved ejection    fraction of 65% with slight hypokinesis of the distal anterior wall. 6. Abdominal aortogram:  The abdominal aortogram is normal.  SUMMARY:  Patient with previous anterior infarct, treated with emergent PTCA with re-stenosis dilated in January.  Also the diagonal dilated at that time. There is no re-stenosis of the LAD  at this time.  The diagonal has re-stenosis.  There is a 50-70% lesion in the RCA.  The LV is normal.  Abdominal aortogram normal.  Cine were reviewed with Dr. Gerri Spore who felt that medical therapy would be indicated.  He should have a Cardiolite. DD:  01/07/00 TD:  01/07/00 Job: [id number]

## 2010-07-12 NOTE — Cardiovascular Report (Signed)
[place]. [facility]  Patient:    [name] [name], [name] [name]                      MRN: [id number] Proc. Date: 01/10/00 Adm. Date:  [id number] Disc. Date: [id number] Attending:  Pricilla Riffle CC:         Madolyn Frieze. Jens Som, M.D. [facility]  Dr. Sudie Bailey   Cardiac Catheterization  PROCEDURES PERFORMED:  PTCA and stenting of the distal right coronary artery.  DIAGNOSIS:  Severe single-vessel coronary artery disease.  HISTORY:  [name] is a 70 year old white male with no history of coronary artery disease who has previously undergone percutaneous intervention to the LAD with placement of a stent.  The patient has recently had recurrent chest discomfort and on cardiac catheterization he was found to have patency of the LAD stent, however, he had a moderate lesion of 50% to 70% in the distal RCA. He underwent stress imaging study showing ischemia of the inferior wall and he presents now for percutaneous intervention to the distal RCA.  TECHNIQUE:  After informed consent was obtained, the patient was brought to the cardiac catheterization laboratory where both groins were sterilely prepped and draped.  One percent lidocaine was used to infiltrate the right groin.  A 7-French sheath was placed in the right femoral artery using the modified Seldinger technique.  A 7-French JR-4 guide catheter was used to engage the right coronary and selective guide shots were obtained using manual injections of contrast.  This confirmed the presence of 70% stenosis in the distal RCA prior to the takeoff of the posterior descending artery.  The patient received 300 mg of Plavix p.o. and was given heparin and ReoPro on a weight adjusted basis intravenously until an ACT of 250 seconds was achieved. A 0.014 inch Patriot wire was advanced in the distal RCA and a 3.0 x 20 mm Maverick balloon was introduced.  This was used to predilate the lesion at 8 atm x 30 seconds.  Repeat angiography showed  significant improvement in the vessel lumen, however, there was residual narrowing of at least 30% to 40%.  A 3.0 x 15 mm NIR Elite stent was carefully positioned under cineangiography in the distal RCA and deployed at 8 atm x 30 seconds.  The stent delivery system was used to further dilate the stent at 14 atm x 30 seconds.  Repeat angiography now showed an excellent result with full apposition of the stent and no residual stenosis.  There was no evidence of distal vessel damage. Final angiography was performed in various projections after the administration of intracoronary nitroglycerin showing TIMI-3 flow throughout the right coronary artery and no distal vessel damage.  The guide catheter was then removed and the sheath was secured into position.  The patient was transferred to the holding area in stable condition.  The sheath will be removed when the ACT returns to normal.  FINAL RESULTS:  Successful percutaneous transluminal coronary angioplasty and stenting of the distal right coronary artery with reduction of a 70% narrowing to 0% with placement of a 3.0 x 15 mm NIR Elite stent dilated to approximately 3.2. mm. DD:  01/10/00 TD:  01/10/00 Job: [id number]

## 2010-07-12 NOTE — Cardiovascular Report (Signed)
[facility]  Patient:    [name] [name]                       MRN: [id number] Proc. Date: 03/07/99 Adm. Date:  [id number] Attending:  Lenoria Farrier CC:         Veneda Melter, M.D. [facility]             Madolyn Frieze. Jens Som, M.D. [facility]             Bruce R. Juanda Chance, M.D. [facility]             Butch Penny, M.D.                        Cardiac Catheterization  REDICTATION  PROCEDURES PERFORMED: 1. Left heart catheterization. 2. Right heart catheterization. 3. Left ventriculogram. 4. Intravascular ultrasound of the left anterior descending. 5. Percutaneous transluminal coronary angioplasty of the left anterior descending. 6. Percutaneous transluminal coronary angioplasty of the second diagonal branch of    the left anterior descending. 7. Perclose suture closure of the right femoral artery.  DIAGNOSES: 1. 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 2. Mild left ventricular dysfunction. 3. Normal right heart pressures.  HISTORY:  Mr. 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  This was treated with primary angioplasty and placement of a stent in the mid LAD.  The patient did well; however, he has  recently had recurrence of symptoms prompting a Cardiolite study.  This showed scarring in the distal anterior wall with a moderate amount of peri-infarct ischemia.  The patient presents now for right and left heart catheterization.  TECHNIQUE:  After informed consent was obtained, patient was brought to the catheterization lab and both groins were sterilely prepped and draped. Lidocaine 1% was used to infiltrate the right groin and 8-French venous and 6-French arterial sheaths placed using modified Seldinger technique.  A 7.5-French PA catheter was then advanced via the right femoral vein into the pulmonary artery and appropriate right-sided hemodynamics were obtained.  A  6-French pigtail catheter was advanced into the left ventricle and a left ventriculogram performed after appropriate left-sided hemodynamics were obtained.  The 6-French JL4 and JR4 catheters were  then used to engage the left and right coronary arteries and selective angiography performed in various projections using manual injections of contrast.  Initial findings are as follows:  1. Hemodynamics:  RA equals 8, RV equals 21/8, PA equals 21/13, pulmonary capillary    wedge pressure equals 12, LV equals 112/10, aortic equals 113/53, LVEDP equals    20.  Aortic saturation is 95%.  PA saturation is 77.4%.  Mitral valve gradient    is 3.3 mmHg.  Cardiac output is 4.7 l/min with cardiac index of 2.44 l/min per    sq m by thermodilution.  Output and index are 6.0 and 3.1, respectively, by he    Fick method. 2. Left main:  This is a large-caliber vessel with mild irregularities and    narrowings not greater than 20%. 3. LAD:  This is a large-caliber vessel that provides two diagonal branches in he    proximal segment.  The first diagonal branch is a trivial vessel that has an    ostial narrowing of 90%.  The second diagonal branch is a medium-caliber vessel    that has narrowings of 70% in its proximal segment.  The proximal LAD  has mild    narrowings of 30%.  There is a moderate stenosis in the midsection of the LAD    after takeoff of the second diagonal branch; this is within the distal segment    of the previously placed stent that appears slightly hazy angiographically.    The distal LAD has mild irregularities. 4. Left circumflex artery:  This is a medium-caliber vessel that provides a large    first marginal branch in the midsection and a smaller second marginal branch    distally.  There are mild irregularities in the left circumflex system. 5. Right coronary artery:  Right coronary artery is dominant.  This is a    large-caliber vessel that provides the posterior  descending artery as well as    several posterior ventricular branches in its terminal segment.  The right    coronary artery has a moderate narrowing of 70% in the distal segment, prior to    the takeoff of the PDA. 6. LV:  Normal end-systolic and end-diastolic dimensions.  Overall left ventricular    function is mildly impaired.  Ejection fraction is approximately 40%.  There    is severe hypokinesis of the mid and distal anterior wall as well as    the apex.  There is no mitral regurgitation.  Given these findings, further assessment of the mid LAD was warranted and we prepared for intravascular ultrasound.  Three thousand units of heparin were administered intravenously and a 6-French JL4 guide catheter used to engage the  left coronary artery.  A 0.014-inch Hi-Torque Floppy wire was advanced into the  distal LAD and intravascular ultrasound performed using automated pullback. This showed the distal LAD to be a moderate-size vessel of approximately 3 mm.  The mid LAD in the distal segments of the stent was a large vessel, approximately 4 mm in diameter; the proximal LAD was a 4.5-mm vessel.  The stent appeared to be dilated to just under 3 mm with a lumen of approximately 2.2 mm.  This was felt to be a significant stenosis and preparation was then made for percutaneous intervention.  Additional heparin was administered to maintain ACT of greater than 300 seconds.  A 3.5 x 15.0-mm [place] Ranger balloon was introduced and inflated within the mid LAD, within the distal segment of the stent, at 14 atmospheres for 30 seconds.  A second inflation at 14 atmospheres for 30 seconds was performed within the proximal segment of the stent.  Repeat angiography showed significant improvement in the lumen of the mid LAD, with moderate residual disease just distal to the stent within the distal LAD.  An inflation at 8 atmospheres for 60 seconds was performed in the distal LAD.  Repeat  angiography showed an excellent result  with 0% residual narrowing.  The second diagonal branch appeared to be slightly   more compromised with a pinch of approximately 80-90% at its ostium.  The Floppy wire was positioned at this second diagonal branch and a 2.5 x 20.0-mm CrossSail balloon introduced.  Two inflations at 8 atmospheres for 30 seconds were performed within the ostium and proximal segment of the second diagonal branch.  A third inflation at 10 atmospheres for 30 seconds was performed with the tip of the balloon extending into the ostium of further dilate the stent struts.  Repeat angiography showed an excellent result with less than 5-10% residual narrowing t the ostium of the second diagonal branch.  There was TIMI-3 flow through the LAD and diagonal branches and  no evidence of vessel compromise.  Final angiography as then performed in various projections, confirming these findings.  The guide catheter was then removed.  A Perclose suture closure device was deployed to the right femoral artery after an angiogram confirmed no peripheral vascular disease in the right common femoral vessel.  Adequate hemostasis was achieved and the patient transferred to the holding area.  The venous sheath was removed and manual pressure applied until adequate hemostasis was achieved.  The patient tolerated the procedure well and was transferred to the floor in stable condition.  FINAL RESULT: 1. Successful percutaneous transluminal coronary angioplasty of the mid left    anterior descending with reduction of 75% in-stent restenosis to 0%. 2. Successful percutaneous transluminal coronary angioplasty of the second diagonal    branch with reduction of 70% narrowing to 5%. DD:  03/07/99 TD:  03/07/99 Job: [id number]

## 2010-08-08 ENCOUNTER — Encounter: Payer: Self-pay | Admitting: Adult Health

## 2010-08-08 ENCOUNTER — Encounter: Payer: Self-pay | Admitting: *Deleted

## 2010-08-08 ENCOUNTER — Ambulatory Visit (INDEPENDENT_AMBULATORY_CARE_PROVIDER_SITE_OTHER): Payer: Medicare Other | Admitting: Adult Health

## 2010-08-08 DIAGNOSIS — I10 Essential (primary) hypertension: Secondary | ICD-10-CM

## 2010-08-08 DIAGNOSIS — I2510 Atherosclerotic heart disease of native coronary artery without angina pectoris: Secondary | ICD-10-CM

## 2010-08-08 NOTE — Assessment & Plan Note (Signed)
[name] [name] is without complaint and remains active.  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  He denies DOE or chest discomfort.  He has not had a stress test in 5 years and has had stents placed in 2002.  I will do follow-up stress myoview for continued evaluation for progression of CAD. He will follow-up with Dr. Dietrich Pates for test results and check in with physician and then I will continue to follow him.

## 2010-08-08 NOTE — Progress Notes (Signed)
HPI: Mr. [name] [name] is a 70 y/o CM patient of Dr. Dietrich Pates who we are following for known history of CAD with stents to the third diagonal of the LAD in 2002, hypertension, and hypercholesterolemia.  He is without complaint today of chest discomfort, DOE, or weakness and fatigue.  He is a busy man out and about with friends and family daily through his church.  Dr. Margo Aye keeps up with his cholesterol studies and he has recently had blood work for this.   Allergies  Allergen Reactions  . Penicillins     REACTION: nausea    Current Outpatient Prescriptions  Medication Sig Dispense Refill  . aspirin 325 MG tablet Take 325 mg by mouth daily.        . metoprolol (LOPRESSOR) 50 MG tablet Take 50 mg by mouth 2 (two) times daily. Take 1/2 tab bid '       . olmesartan (BENICAR) 40 MG tablet Take 40 mg by mouth daily.        . pravastatin (PRAVACHOL) 80 MG tablet Take 80 mg by mouth daily.          Past Medical History  Diagnosis Date  . Coronary artery disease     Stent to the third diagonal, LAD 2002. F/U stress myoview in 2007 negative for ischemia.  . Hyperlipidemia     Followed by Dr. Margo Aye  . Hypertension     Past Surgical History  Procedure Date  . Cardiac catheterization   . Coronary angioplasty     ZOX:WRUEAV of systems complete and found to be negative unless listed above PHYSICAL EXAM BP 129/70  Pulse 56  Ht 5\' 9"  (1.753 m)  Wt 186 lb (84.369 kg)  BMI 27.47 kg/m2 General: Well developed, well nourished, in no acute distress Head: Eyes PERRLA, No xanthomas.   Normal cephalic and atramatic  Lungs: Clear bilaterally to auscultation and percussion. Heart: HRRR S1 S2.  Pulses are 2+ & equal.            No carotid bruit. No JVD.  No abdominal bruits. No femoral bruits. Abdomen: Bowel sounds are positive, abdomen soft and non-tender without masses or                  Hernia's noted. Msk:  Back normal, normal gait. Normal strength and tone for age. Extremities: No clubbing, cyanosis  or edema.  DP +1 Neuro: Alert and oriented X 3. Psych:  Good affect, responds appropriately   ASSESSMENT AND PLAN

## 2010-08-08 NOTE — Patient Instructions (Signed)
Your physician recommends that you schedule a follow-up appointment in: after tests Your physician has requested that you have en exercise stress myoview. For further information please visit [email or web address] Please follow instruction sheet, as given.

## 2010-08-12 ENCOUNTER — Encounter: Payer: Self-pay | Admitting: *Deleted

## 2010-08-16 ENCOUNTER — Other Ambulatory Visit: Payer: Self-pay | Admitting: Cardiology

## 2010-08-16 DIAGNOSIS — I10 Essential (primary) hypertension: Secondary | ICD-10-CM

## 2010-08-21 ENCOUNTER — Ambulatory Visit (INDEPENDENT_AMBULATORY_CARE_PROVIDER_SITE_OTHER): Payer: Medicare Other | Admitting: *Deleted

## 2010-08-21 ENCOUNTER — Encounter (HOSPITAL_COMMUNITY): Payer: Medicare Other

## 2010-08-21 ENCOUNTER — Encounter (HOSPITAL_COMMUNITY)
Admission: RE | Admit: 2010-08-21 | Discharge: 2010-08-21 | Disposition: A | Discharge status: Home / Self Care | Payer: Medicare Other | Source: Ambulatory Visit | Attending: Cardiology | Admitting: Cardiology

## 2010-08-21 DIAGNOSIS — R079 Chest pain, unspecified: Secondary | ICD-10-CM | POA: Insufficient documentation

## 2010-08-21 DIAGNOSIS — E785 Hyperlipidemia, unspecified: Secondary | ICD-10-CM | POA: Insufficient documentation

## 2010-08-21 DIAGNOSIS — I10 Essential (primary) hypertension: Secondary | ICD-10-CM | POA: Insufficient documentation

## 2010-08-21 DIAGNOSIS — I2510 Atherosclerotic heart disease of native coronary artery without angina pectoris: Secondary | ICD-10-CM

## 2010-08-21 MED ORDER — TECHNETIUM TC 99M TETROFOSMIN IV KIT
30.0000 | PACK | Freq: Once | INTRAVENOUS | Status: AC | PRN
  Administered 2010-08-21: 29 via INTRAVENOUS

## 2010-08-21 MED ORDER — TECHNETIUM TC 99M TETROFOSMIN IV KIT
10.0000 | PACK | Freq: Once | INTRAVENOUS | Status: AC | PRN
  Administered 2010-08-21: 10 via INTRAVENOUS

## 2010-08-21 NOTE — Progress Notes (Deleted)

## 2010-09-02 ENCOUNTER — Encounter: Payer: Self-pay | Admitting: Cardiology

## 2010-09-05 ENCOUNTER — Ambulatory Visit (INDEPENDENT_AMBULATORY_CARE_PROVIDER_SITE_OTHER): Payer: Medicare Other | Admitting: Cardiology

## 2010-09-05 ENCOUNTER — Encounter: Payer: Self-pay | Admitting: Cardiology

## 2010-09-05 VITALS — BP 135/77 | HR 55 | Ht 69.0 in | Wt 184.0 lb

## 2010-09-05 DIAGNOSIS — R0989 Other specified symptoms and signs involving the circulatory and respiratory systems: Secondary | ICD-10-CM

## 2010-09-05 NOTE — Progress Notes (Signed)
Mr. [name] [name] returns to the office as requested by [name] [name] following a stress nuclear study.  Patient questions why he has been asked to return and why he is seeing me.  He has been a patient of Dr. Fabio [name] and Dr. Ival [name] in the past and has most recently seen Dr. Daleen [name].  He is asymptomatic.  His stress test shows anteroapical scar without ischemia and with preserved left ventricular systolic function.  Blood pressure has been good.  The most recent lipid results available to me are from 2010, but were excellent.  We will leave it to Dr. Margo [name] to manage treatment of hyperlipidemia.  I agreed with [name] [name] that this visit was not necessary and have arranged for him not to be charged.  He will return in one year for reassessment by [name] [name] and continue his current medical regime in the interim.

## 2010-11-12 ENCOUNTER — Other Ambulatory Visit (HOSPITAL_COMMUNITY): Payer: Self-pay | Admitting: Internal Medicine

## 2010-11-12 DIAGNOSIS — R51 Headache: Secondary | ICD-10-CM

## 2010-11-15 ENCOUNTER — Ambulatory Visit (HOSPITAL_COMMUNITY)
Admission: RE | Admit: 2010-11-15 | Discharge: 2010-11-15 | Disposition: A | Discharge status: Home / Self Care | Payer: Medicare Other | Source: Ambulatory Visit | Attending: Internal Medicine | Admitting: Internal Medicine

## 2010-11-15 DIAGNOSIS — R51 Headache: Secondary | ICD-10-CM | POA: Insufficient documentation

## 2010-11-15 DIAGNOSIS — G319 Degenerative disease of nervous system, unspecified: Secondary | ICD-10-CM | POA: Insufficient documentation

## 2011-01-25 DIAGNOSIS — I639 Cerebral infarction, unspecified: Secondary | ICD-10-CM

## 2011-01-25 DIAGNOSIS — K56600 Partial intestinal obstruction, unspecified as to cause: Secondary | ICD-10-CM

## 2011-01-25 HISTORY — DX: Partial intestinal obstruction, unspecified as to cause: K56.600

## 2011-01-25 HISTORY — DX: Cerebral infarction, unspecified: I63.9

## 2011-02-19 ENCOUNTER — Inpatient Hospital Stay (HOSPITAL_COMMUNITY)
Admission: EM | Admit: 2011-02-19 | Discharge: 2011-02-21 | DRG: 065 | Disposition: A | Discharge status: Home / Self Care | Payer: Medicare Other | Attending: Internal Medicine | Admitting: Internal Medicine

## 2011-02-19 ENCOUNTER — Emergency Department (HOSPITAL_COMMUNITY): Payer: Medicare Other

## 2011-02-19 ENCOUNTER — Encounter (HOSPITAL_COMMUNITY): Payer: Self-pay | Admitting: Emergency Medicine

## 2011-02-19 ENCOUNTER — Other Ambulatory Visit: Payer: Self-pay

## 2011-02-19 DIAGNOSIS — R471 Dysarthria and anarthria: Secondary | ICD-10-CM | POA: Diagnosis present

## 2011-02-19 DIAGNOSIS — I10 Essential (primary) hypertension: Secondary | ICD-10-CM | POA: Diagnosis present

## 2011-02-19 DIAGNOSIS — K5669 Other intestinal obstruction: Secondary | ICD-10-CM | POA: Diagnosis not present

## 2011-02-19 DIAGNOSIS — I2510 Atherosclerotic heart disease of native coronary artery without angina pectoris: Secondary | ICD-10-CM | POA: Diagnosis present

## 2011-02-19 DIAGNOSIS — G8190 Hemiplegia, unspecified affecting unspecified side: Secondary | ICD-10-CM | POA: Diagnosis present

## 2011-02-19 DIAGNOSIS — Z9861 Coronary angioplasty status: Secondary | ICD-10-CM

## 2011-02-19 DIAGNOSIS — R112 Nausea with vomiting, unspecified: Secondary | ICD-10-CM | POA: Diagnosis not present

## 2011-02-19 DIAGNOSIS — G459 Transient cerebral ischemic attack, unspecified: Secondary | ICD-10-CM | POA: Diagnosis present

## 2011-02-19 DIAGNOSIS — K56600 Partial intestinal obstruction, unspecified as to cause: Secondary | ICD-10-CM | POA: Diagnosis not present

## 2011-02-19 DIAGNOSIS — I252 Old myocardial infarction: Secondary | ICD-10-CM

## 2011-02-19 DIAGNOSIS — I639 Cerebral infarction, unspecified: Secondary | ICD-10-CM | POA: Diagnosis present

## 2011-02-19 DIAGNOSIS — I6350 Cerebral infarction due to unspecified occlusion or stenosis of unspecified cerebral artery: Principal | ICD-10-CM | POA: Diagnosis present

## 2011-02-19 DIAGNOSIS — I219 Acute myocardial infarction, unspecified: Secondary | ICD-10-CM | POA: Insufficient documentation

## 2011-02-19 DIAGNOSIS — E785 Hyperlipidemia, unspecified: Secondary | ICD-10-CM | POA: Diagnosis present

## 2011-02-19 HISTORY — DX: Partial intestinal obstruction, unspecified as to cause: K56.600

## 2011-02-19 HISTORY — DX: Cerebral infarction, unspecified: I63.9

## 2011-02-19 HISTORY — DX: Acute myocardial infarction, unspecified: I21.9

## 2011-02-19 LAB — COMPREHENSIVE METABOLIC PANEL
BUN: 11 mg/dL (ref 6–23)
CO2: 28 mEq/L (ref 19–32)
Calcium: 9.5 mg/dL (ref 8.4–10.5)
Chloride: 105 mEq/L (ref 96–112)
Creatinine, Ser: 0.79 mg/dL (ref 0.50–1.35)
GFR calc Af Amer: >90 mL/min (ref >90)
GFR calc non Af Amer: 88 mL/min — ABNORMAL LOW (ref >90)
Total Bilirubin: 0.8 mg/dL (ref 0.3–1.2)

## 2011-02-19 LAB — CBC
HCT: 41.2 % (ref 39.0–52.0)
Hemoglobin: 14.3 g/dL (ref 13.0–17.0)
MCHC: 34.7 g/dL (ref 30.0–36.0)
MCV: 87.8 fL (ref 78.0–100.0)
WBC: 7 10*3/uL (ref 4.0–10.5)

## 2011-02-19 LAB — DIFFERENTIAL
Eosinophils Relative: 4 % (ref 0–5)
Lymphocytes Relative: 40 % (ref 12–46)
Monocytes Absolute: 0.6 10*3/uL (ref 0.1–1.0)
Monocytes Relative: 8 % (ref 3–12)
Neutro Abs: 3.4 10*3/uL (ref 1.7–7.7)

## 2011-02-19 LAB — PROTIME-INR
INR: 1.07 (ref 0.00–1.49)
Prothrombin Time: 14.1 seconds (ref 11.6–15.2)

## 2011-02-19 MED ORDER — SODIUM CHLORIDE 0.9 % IV SOLN: Freq: Once | INTRAVENOUS | Status: DC

## 2011-02-19 MED ORDER — SODIUM CHLORIDE 0.45 % IV SOLN: INTRAVENOUS | Status: DC

## 2011-02-19 NOTE — ED Provider Notes (Signed)
History    This chart was scribed for [name], MD, MD by Smitty Pluck. The patient was seen in room APA09 and the patient's care was started at 10:17PM.   CSN: [id number]  Arrival date & time 02/19/11  2207   First MD Initiated Contact with Patient 02/19/11 2215      Chief Complaint  Patient presents with  . Cerebrovascular Accident    (Consider location/radiation/quality/duration/timing/severity/associated sxs/prior treatment) Patient is a 70 y.o. male presenting with Acute Neurological Problem. The history is provided by the patient.  Cerebrovascular Accident   [name] [name] is a 70 y.o. male who presents to the Emergency Department complaining of sharp pain in head onset today 5 hours ago. Pt states that the pain radiated down right arm. Pt states that his gate was abnormal and his speech was different. Pt reports he does not have the pain now but he is having problems talking onset last hour. Pt reports being in hospital 6 weeks ago for mini strokes. PCP is Dr. Margo Aye.  Past Medical History  Diagnosis Date  . Coronary artery disease     Stent to the third diagonal, LAD 2002. F/U stress myoview in 2007 negative for ischemia.  . Hyperlipidemia     Followed by Dr. Margo Aye  . Hypertension   . Drug intolerance     ACE inhibitor; related to cough   . MI (myocardial infarction)   . CVA (cerebral vascular accident)     Past Surgical History  Procedure Date  . Cardiac catheterization   . Coronary angioplasty   . Polypectomy 2005    Family History  Problem Relation Age of Onset  . Cancer Father   . Heart failure Father   . Hyperlipidemia Father   . Diabetes Sister   . Hypertension Sister     History  Substance Use Topics  . Smoking status: Former Games developer  . Smokeless tobacco: Never Used  . Alcohol Use: No      Review of Systems  All other systems reviewed and are negative.   10 Systems reviewed and are negative for acute change except as noted in the  HPI.  Allergies  Penicillins  Home Medications   Current Outpatient Rx  Name Route Sig Dispense Refill  . ASPIRIN EC 325 MG PO TBEC Oral Take 325 mg by mouth daily.      Marland Kitchen METOPROLOL TARTRATE 50 MG PO TABS Oral Take 50 mg by mouth as directed. Take 1/2 tab bid '    . ONE-DAILY MULTI VITAMINS PO TABS Oral Take 1 tablet by mouth daily.      Marland Kitchen OLMESARTAN MEDOXOMIL 40 MG PO TABS Oral Take 40 mg by mouth daily.      Marland Kitchen CALCIUM POLYCARBOPHIL 625 MG PO TABS Oral Take 1,250 mg by mouth daily.      Marland Kitchen PRAVASTATIN SODIUM 80 MG PO TABS Oral Take 80 mg by mouth daily.      Marland Kitchen NITROGLYCERIN 0.4 MG SL SUBL Sublingual Place 0.4 mg under the tongue every 5 (five) minutes as needed.        BP 137/86  Pulse 63  Temp(Src) 98.2 F (36.8 C) (Oral)  Resp 18  Ht 5\' 9"  (1.753 m)  Wt 180 lb (81.647 kg)  BMI 26.58 kg/m2  SpO2 95%  Physical Exam  Nursing note and vitals reviewed. Constitutional: He is oriented to person, place, and time. He appears well-developed and well-nourished.  HENT:  Head: Normocephalic and atraumatic.  Eyes: Conjunctivae and  EOM are normal. No scleral icterus.  Neck: Neck supple. No thyromegaly present.  Cardiovascular: Normal rate and regular rhythm.  Exam reveals no gallop and no friction rub.   No murmur heard. Pulmonary/Chest: No stridor. He has no wheezes. He has no rales. He exhibits no tenderness.  Abdominal: He exhibits no distension. There is no tenderness. There is no rebound.  Musculoskeletal: Normal range of motion. He exhibits no edema.  Lymphadenopathy:    He has no cervical adenopathy.  Neurological: He is oriented to person, place, and time. Coordination normal.       Slurred speech  Skin: No rash noted. No erythema.  Psychiatric: He has a normal mood and affect. His behavior is normal.    ED Course  Procedures (including critical care time)  DIAGNOSTIC STUDIES: Oxygen Saturation is 95% on room air, normal by my interpretation.    COORDINATION OF  CARE:    Labs Reviewed  COMPREHENSIVE METABOLIC PANEL - Abnormal; Notable for the following:    Glucose, Bld 130 (*)    GFR calc non Af Amer 88 (*)    All other components within normal limits  CBC  DIFFERENTIAL  PROTIME-INR   Ct Head Wo Contrast  02/19/2011  *RADIOLOGY REPORT*  Clinical Data: Sharp head pain for past 5 hours.  Abnormal gait and change in speech.  History mini strokes.  High blood pressure.  CT HEAD WITHOUT CONTRAST  Technique:  Contiguous axial images were obtained from the base of the skull through the vertex without contrast.  Comparison: 11/15/2010.  Findings: Remote small left caudate head infarct with adjacent small vessel disease type changes.  Prominent perivascular space versus old lacunar infarct right basal ganglia.  No CT evidence of large acute infarct.  Small acute infarct cannot be excluded by CT.  No intracranial mass lesion detected on this unenhanced exam.  No hydrocephalus.  Partially empty sella.  Vascular calcifications.  IMPRESSION: Remote infarcts and small vessel disease type changes.  No CT evidence of large acute infarct or intracranial hemorrhage.  Original Report Authenticated By: Fuller Canada, M.D.     1. TIA (transient ischemic attack)     Date: 02/19/2011  Rate:61  Rhythm: normal sinus rhythm  QRS Axis: normal  Intervals: normal  ST/T Wave abnormalities: nonspecific ST changes inverted t waves  Conduction Disutrbances:none  Narrative Interpretation:   Old EKG Reviewed: none available  CRITICAL CARE Performed by: Lakeesha Fontanilla L   Total critical care time:40  Critical care time was exclusive of separately billable procedures and treating other patients.  Critical care was necessary to treat or prevent imminent or life-threatening deterioration.  Critical care was time spent personally by me on the following activities: development of treatment plan with patient and/or surrogate as well as nursing, discussions with consultants,  evaluation of patient's response to treatment, examination of patient, obtaining history from patient or surrogate, ordering and performing treatments and interventions, ordering and review of laboratory studies, ordering and review of radiographic studies, pulse oximetry and re-evaluation of patient's condition.   MDM     The chart was scribed for me under my direct supervision.  I personally performed the history, physical, and medical decision making and all procedures in the evaluation of this patient.[name], MD 02/19/11 [phone]

## 2011-02-19 NOTE — ED Notes (Signed)
Patient states he started having pain in the back of his head that radiated down right arm around 530 this evening. States his gate was not normal for him, but now the abnormal gate has subsided. Also states pain has subsided. Complaining of speech being different as well, denies weakness.

## 2011-02-20 ENCOUNTER — Observation Stay (HOSPITAL_COMMUNITY): Payer: Medicare Other

## 2011-02-20 ENCOUNTER — Encounter (HOSPITAL_COMMUNITY): Payer: Self-pay | Admitting: Internal Medicine

## 2011-02-20 DIAGNOSIS — G459 Transient cerebral ischemic attack, unspecified: Secondary | ICD-10-CM | POA: Diagnosis present

## 2011-02-20 DIAGNOSIS — I639 Cerebral infarction, unspecified: Secondary | ICD-10-CM | POA: Diagnosis present

## 2011-02-20 DIAGNOSIS — I379 Nonrheumatic pulmonary valve disorder, unspecified: Secondary | ICD-10-CM

## 2011-02-20 LAB — LIPID PANEL
HDL: 41 mg/dL (ref >39)
LDL Cholesterol: 74 mg/dL (ref 0–99)
Triglycerides: 143 mg/dL (ref <150)

## 2011-02-20 LAB — CBC
HCT: 39.4 % (ref 39.0–52.0)
MCHC: 35.3 g/dL (ref 30.0–36.0)
MCV: 87.9 fL (ref 78.0–100.0)
RDW: 12.9 % (ref 11.5–15.5)

## 2011-02-20 LAB — HEMOGLOBIN A1C: Hgb A1c MFr Bld: 5.6 % (ref <5.7)

## 2011-02-20 LAB — BASIC METABOLIC PANEL
BUN: 11 mg/dL (ref 6–23)
Chloride: 107 mEq/L (ref 96–112)
Creatinine, Ser: 0.67 mg/dL (ref 0.50–1.35)
GFR calc Af Amer: >90 mL/min (ref >90)

## 2011-02-20 LAB — GLUCOSE, CAPILLARY: Glucose-Capillary: 129 mg/dL — ABNORMAL HIGH (ref 70–99)

## 2011-02-20 MED ORDER — ASPIRIN-DIPYRIDAMOLE ER 25-200 MG PO CP12
1.0000 | ORAL_CAPSULE | Freq: Two times a day (BID) | ORAL | Status: DC
  Administered 2011-02-20 (×2): 1 via ORAL
  Filled 2011-02-20 (×3): qty 1

## 2011-02-20 MED ORDER — ENOXAPARIN SODIUM 40 MG/0.4ML ~~LOC~~ SOLN
40.0000 mg | Freq: Every day | SUBCUTANEOUS | Status: DC
  Administered 2011-02-20 – 2011-02-21 (×2): 40 mg via SUBCUTANEOUS
  Filled 2011-02-20 (×2): qty 0.4

## 2011-02-20 MED ORDER — METOPROLOL TARTRATE 50 MG PO TABS: 50.0000 mg | ORAL_TABLET | ORAL | Status: DC

## 2011-02-20 MED ORDER — BISACODYL 10 MG RE SUPP: 10.0000 mg | Freq: Every day | RECTAL | Status: DC | PRN

## 2011-02-20 MED ORDER — SODIUM CHLORIDE 0.9 % IV SOLN
INTRAVENOUS | Status: DC
  Administered 2011-02-20 – 2011-02-21 (×2): via INTRAVENOUS
  Filled 2011-02-20 (×7): qty 1000

## 2011-02-20 MED ORDER — ONDANSETRON HCL 4 MG/2ML IJ SOLN
4.0000 mg | INTRAMUSCULAR | Status: DC | PRN
  Administered 2011-02-21: 4 mg via INTRAVENOUS
  Filled 2011-02-20 (×2): qty 2

## 2011-02-20 MED ORDER — METOPROLOL TARTRATE 25 MG PO TABS
12.5000 mg | ORAL_TABLET | Freq: Two times a day (BID) | ORAL | Status: DC
  Administered 2011-02-20 – 2011-02-21 (×3): 12.5 mg via ORAL
  Filled 2011-02-20 (×4): qty 1

## 2011-02-20 MED ORDER — NITROGLYCERIN 0.4 MG SL SUBL: 0.4000 mg | SUBLINGUAL_TABLET | SUBLINGUAL | Status: DC | PRN

## 2011-02-20 MED ORDER — ACETAMINOPHEN 325 MG PO TABS
650.0000 mg | ORAL_TABLET | ORAL | Status: DC | PRN
  Administered 2011-02-20: 650 mg via ORAL
  Filled 2011-02-20: qty 2

## 2011-02-20 MED ORDER — OLMESARTAN MEDOXOMIL 20 MG PO TABS
40.0000 mg | ORAL_TABLET | Freq: Every day | ORAL | Status: DC
  Administered 2011-02-20 – 2011-02-21 (×2): 40 mg via ORAL
  Filled 2011-02-20 (×2): qty 2

## 2011-02-20 MED ORDER — ASPIRIN EC 325 MG PO TBEC
325.0000 mg | DELAYED_RELEASE_TABLET | Freq: Every day | ORAL | Status: DC
  Administered 2011-02-20: 325 mg via ORAL
  Filled 2011-02-20: qty 1

## 2011-02-20 MED ORDER — SENNOSIDES-DOCUSATE SODIUM 8.6-50 MG PO TABS: 2.0000 | ORAL_TABLET | Freq: Every evening | ORAL | Status: DC | PRN

## 2011-02-20 MED ORDER — SIMVASTATIN 20 MG PO TABS
40.0000 mg | ORAL_TABLET | Freq: Every day | ORAL | Status: DC
  Administered 2011-02-20 – 2011-02-21 (×2): 40 mg via ORAL
  Filled 2011-02-20 (×2): qty 2

## 2011-02-20 MED ORDER — CALCIUM POLYCARBOPHIL 625 MG PO TABS
1250.0000 mg | ORAL_TABLET | Freq: Every day | ORAL | Status: DC
  Administered 2011-02-21: 1250 mg via ORAL
  Filled 2011-02-20 (×5): qty 2

## 2011-02-20 NOTE — Progress Notes (Signed)
*  PRELIMINARY RESULTS* Echocardiogram 2D Echocardiogram has been performed.  [name] [name] [place] 02/20/2011, 11:57 AM

## 2011-02-20 NOTE — Progress Notes (Signed)
Pt declines tx..  All sx resolved.  He was observed walking about the room and no evidence of strength, balance or coordination deficit were seen.  Will d/c PT.

## 2011-02-20 NOTE — H&P (Signed)
PCP:   Dwana Melena, MD   Chief Complaint:  Difficulty speaking and right arm weakness this evening  HPI: [name] [name] is an 70 y.o. male.  Elderly Caucasian, cardiac intervention 12 years ago, hypertension, hyperlipidemia; in fairly good state of health; [name] spends a lot of time going door to door with pamphlets. Had CT of the brain done about 2 months ago by his primary care physician because of the headache; revealed old asymptomatic stroke.  Patient has been having a left-sided headache for about 5 hours before the onset of his symptoms is about 9 PM. While preparing for bed at about 9 PM this evening noticed that his right arm was weak; he was nevertheless able to use the bathroom,, and walked over to his sofa without noticing any dizziness or ataxia, but could not see properly to plug in his phone charger, and then began to notice that he was having drooling from the right side of his mouth. He called a friend to tell him something was wrong, and noticed that he couldn't talk properly, and because his friend noticed something was wrong with his speech, he called an ambulance.   In the emergency room patient was evaluated, seen by tele-neurology and they opted not to give TPA. By the time the hospitalist service was called to admit the patient, at 11:45 PM, all his symptoms had resolved and he was completely back to normal.  There is no history of fever cough or cold chest pains or shortness of breath; dizziness or syncope no falls or other trauma. Prior to preparing for bed he had taken all his evening medications without difficulty.  He is up-to-date with his influenza vaccine as well as the pneumonia, and zoster vaccine. He reports a recent normal colonoscopy.  His CODE STATUS is a full code, but he does have an advanced directive which goes into more detail.   Rewiew of Systems:  The patient denies anorexia, fever, weight loss,, vision loss, decreased hearing, hoarseness,  chest pain, syncope, dyspnea on exertion, peripheral edema, balance deficits, hemoptysis, abdominal pain, melena, hematochezia, severe indigestion/heartburn, hematuria, incontinence, genital sores, muscle weakness, suspicious skin lesions, transient blindness, difficulty walking, depression, unusual weight change, abnormal bleeding, enlarged lymph nodes, angioedema, and breast masses.   Past Medical History  Diagnosis Date  . Coronary artery disease     Stent to the third diagonal, LAD 2002. F/U stress myoview in 2007 negative for ischemia.  . Hyperlipidemia     Followed by Dr. Margo Aye  . Hypertension   . Drug intolerance     ACE inhibitor; related to cough   . MI (myocardial infarction)   . CVA (cerebral vascular accident)     Past Surgical History  Procedure Date  . Cardiac catheterization   . Coronary angioplasty   . Polypectomy 2005    Medications:  HOME MEDS: Prior to Admission medications   Medication Sig Start Date End Date Taking? Authorizing Provider  aspirin EC 325 MG tablet Take 325 mg by mouth daily.     Yes Historical Provider, MD  metoprolol (LOPRESSOR) 50 MG tablet Take 50 mg by mouth as directed. Take 1/2 tab bid '   Yes Historical Provider, MD  Multiple Vitamin (MULTIVITAMIN) tablet Take 1 tablet by mouth daily.     Yes Historical Provider, MD  olmesartan (BENICAR) 40 MG tablet Take 40 mg by mouth daily.     Yes Historical Provider, MD  polycarbophil (FIBERCON) 625 MG tablet Take 1,250 mg by mouth  daily.     Yes Historical Provider, MD  pravastatin (PRAVACHOL) 80 MG tablet Take 80 mg by mouth daily.     Yes Historical Provider, MD  nitroGLYCERIN (NITROSTAT) 0.4 MG SL tablet Place 0.4 mg under the tongue every 5 (five) minutes as needed.      Historical Provider, MD     Allergies:  Allergies  Allergen Reactions  . Penicillins     REACTION: nausea    Social History:   reports that he has quit smoking. He has never used smokeless tobacco. He reports that he  does not drink alcohol or use illicit drugs.  Family History: Family History  Problem Relation Age of Onset  . Cancer Father   . Heart failure Father   . Hyperlipidemia Father   . Diabetes Sister   . Hypertension Sister   . Cancer Mother   . Cancer Brother   . Cancer Sister      Physical Exam: Filed Vitals:   02/20/11 0000 02/20/11 0014 02/20/11 0015 02/20/11 0025  BP:  140/70  149/70  Pulse: 61 62 62 60  Temp:      TempSrc:      Resp:  20    Height:      Weight:      SpO2: 97% 99% 98% 96%   Blood pressure 149/70, pulse 60, temperature 98.2 F (36.8 C), temperature source Oral, resp. rate 20, height 5\' 9"  (1.753 m), weight 81.647 kg (180 lb), SpO2 96.00%.  GEN:  Pleasant elderly Caucasian gentleman lying in the stretcher in no acute distress; cooperative with exam; very jovial. PSYCH:  alert and oriented x4; does not appear anxious does not appear depressed; affect is normal HEENT: Mucous membranes pink and anicteric; PERRLA; EOM intact; no cervical lymphadenopathy nor thyromegaly or carotid bruit; no JVD; Breasts:: Not examined CHEST WALL: No tenderness CHEST: Normal respiration, clear to auscultation bilaterally HEART: Regular rate and rhythm; no murmurs rubs or gallops BACK: No kyphosis or scoliosis; no CVA tenderness ABDOMEN: Obese, soft non-tender; no masses, no organomegaly, normal abdominal bowel sounds; no pannus; no intertriginous candida. Rectal Exam: Not done EXTREMITIES: No bone or joint deformity; age-appropriate arthropathy of the hands and knees; no edema; no ulcerations. Does have some wasting of the thenar eminence bilaterally. Genitalia: not examined PULSES: 2+ and symmetric SKIN: Normal hydration no rash or ulceration; does have a purple colored birthmark over the occiput CNS: Cranial nerves 2-12 grossly intact no focal neurologic deficit   Labs & Imaging Results for orders placed during the hospital encounter of 02/19/11 (from the past 48 hour(s))    CBC     Status: Normal   Collection Time   02/19/11 10:47 PM      Component Value Range Comment   WBC 7.0  4.0 - 10.5 (K/uL)    RBC 4.69  4.22 - 5.81 (MIL/uL)    Hemoglobin 14.3  13.0 - 17.0 (g/dL)    HCT 08.6  57.8 - 46.9 (%)    MCV 87.8  78.0 - 100.0 (fL)    MCH 30.5  26.0 - 34.0 (pg)    MCHC 34.7  30.0 - 36.0 (g/dL)    RDW 62.9  52.8 - 41.3 (%)    Platelets 169  150 - 400 (K/uL)   DIFFERENTIAL     Status: Normal   Collection Time   02/19/11 10:47 PM      Component Value Range Comment   Neutrophils Relative 48  43 - 77 (%)  Neutro Abs 3.4  1.7 - 7.7 (K/uL)    Lymphocytes Relative 40  12 - 46 (%)    Lymphs Abs 2.8  0.7 - 4.0 (K/uL)    Monocytes Relative 8  3 - 12 (%)    Monocytes Absolute 0.6  0.1 - 1.0 (K/uL)    Eosinophils Relative 4  0 - 5 (%)    Eosinophils Absolute 0.3  0.0 - 0.7 (K/uL)    Basophils Relative 0  0 - 1 (%)    Basophils Absolute 0.0  0.0 - 0.1 (K/uL)   COMPREHENSIVE METABOLIC PANEL     Status: Abnormal   Collection Time   02/19/11 10:47 PM      Component Value Range Comment   Sodium 141  135 - 145 (mEq/L)    Potassium 3.5  3.5 - 5.1 (mEq/L)    Chloride 105  96 - 112 (mEq/L)    CO2 28  19 - 32 (mEq/L)    Glucose, Bld 130 (*) 70 - 99 (mg/dL)    BUN 11  6 - 23 (mg/dL)    Creatinine, Ser 1.61  0.50 - 1.35 (mg/dL)    Calcium 9.5  8.4 - 10.5 (mg/dL)    Total Protein 7.1  6.0 - 8.3 (g/dL)    Albumin 3.9  3.5 - 5.2 (g/dL)    AST 16  0 - 37 (U/L)    ALT 16  0 - 53 (U/L)    Alkaline Phosphatase 80  39 - 117 (U/L)    Total Bilirubin 0.8  0.3 - 1.2 (mg/dL)    GFR calc non Af Amer 88 (*) >90 (mL/min)    GFR calc Af Amer >90  >90 (mL/min)   PROTIME-INR     Status: Normal   Collection Time   02/19/11 10:47 PM      Component Value Range Comment   Prothrombin Time 14.1  11.6 - 15.2 (seconds)    INR 1.07  0.00 - 1.49     Ct Head Wo Contrast  02/19/2011  *RADIOLOGY REPORT*  Clinical Data: Sharp head pain for past 5 hours.  Abnormal gait and change in  speech.  History mini strokes.  High blood pressure.  CT HEAD WITHOUT CONTRAST  Technique:  Contiguous axial images were obtained from the base of the skull through the vertex without contrast.  Comparison: 11/15/2010.  Findings: Remote small left caudate head infarct with adjacent small vessel disease type changes.  Prominent perivascular space versus old lacunar infarct right basal ganglia.  No CT evidence of large acute infarct.  Small acute infarct cannot be excluded by CT.  No intracranial mass lesion detected on this unenhanced exam.  No hydrocephalus.  Partially empty sella.  Vascular calcifications.  IMPRESSION: Remote infarcts and small vessel disease type changes.  No CT evidence of large acute infarct or intracranial hemorrhage.  Original Report Authenticated By: Fuller Canada, M.D.      Assessment Present on Admission:  .DYSLIPIDEMIA .CORONARY ATHEROSCLEROSIS NATIVE CORONARY ARTERY .Essential hypertension, benign .TIA (transient ischemic attack)   PLAN: We'll admit this gentleman on observation TIA protocol; will keep him hydrated and keep his blood pressure controlled; will check fasting lipid panel. And will order MRI and carotid Dopplers to rule out other risk factors; will also get 2-D echo to rule out cardiac risk factors for recurrent cerebrovascular phenomena.  Although he does have a history of headaches we'll attempt a trial of Aggrenox, she is unable to tolerate she may need addition of Plavix.  We'll continue his full dose aspirin while starting Aggrenox.  Have counseled on the value of a regular exercise routine as a part of this preventative regimen.    Other plans as per orders.    Gary Bultman 02/20/2011, 1:43 AM

## 2011-02-20 NOTE — Progress Notes (Signed)
Subjective: No return of symptoms Objective: Vital signs in last 24 hours: Filed Vitals:   02/20/11 0631 02/20/11 0721 02/20/11 0800 02/20/11 1420  BP: 132/82 132/82  136/74  Pulse: 51 51  57  Temp: 97.6 F (36.4 C) 97.6 F (36.4 C)  97.7 F (36.5 C)  TempSrc:  Oral  Oral  Resp: 16 18  18   Height:  5\' 9"  (1.753 m)    Weight:  84.369 kg (186 lb)    SpO2: 95% 95% 95% 95%   Weight change:   Intake/Output Summary (Last 24 hours) at 02/20/11 1757 Last data filed at 02/20/11 2536  Gross per 24 hour  Intake    528 ml  Output      0 ml  Net    528 ml   Physical Exam: General: Occasionally tearful Lungs clear to auscultation bilaterally without wheeze rhonchi or rales Cardiovascular regular rate rhythm without murmurs gallops rubs Abdomen soft nontender nondistended Extremities no clubbing cyanosis or edema Neurologic cranial nerves intact. Motor strength 5 out of 5. Sensation intact.  Lab Results: Basic Metabolic Panel:  Lab 02/20/11 6440 02/19/11 2247  NA 141 141  K 3.8 3.5  CL 107 105  CO2 26 28  GLUCOSE 124* 130*  BUN 11 11  CREATININE 0.67 0.79  CALCIUM 9.2 9.5  MG -- --  PHOS -- --   Liver Function Tests:  Lab 02/19/11 2247  AST 16  ALT 16  ALKPHOS 80  BILITOT 0.8  PROT 7.1  ALBUMIN 3.9   No results found for this basename: LIPASE:2,AMYLASE:2 in the last 168 hours No results found for this basename: AMMONIA:2 in the last 168 hours CBC:  Lab 02/20/11 0454 02/19/11 2247  WBC 8.4 7.0  NEUTROABS -- 3.4  HGB 13.9 14.3  HCT 39.4 41.2  MCV 87.9 87.8  PLT 185 169   Cardiac Enzymes: No results found for this basename: CKTOTAL:3,CKMB:3,CKMBINDEX:3,TROPONINI:3 in the last 168 hours BNP: No results found for this basename: PROBNP:3 in the last 168 hours D-Dimer: No results found for this basename: DDIMER:2 in the last 168 hours CBG:  Lab 02/20/11 1621 02/20/11 1209 02/20/11 0733  GLUCAP 133* 91 99   Hemoglobin A1C:  Lab 02/20/11 0155  HGBA1C 5.6     Fasting Lipid Panel:  Lab 02/20/11 0454  CHOL 144  HDL 41  LDLCALC 74  TRIG 143  CHOLHDL 3.5  LDLDIRECT --   Thyroid Function Tests: No results found for this basename: TSH,T4TOTAL,FREET4,T3FREE,THYROIDAB in the last 168 hours Coagulation:  Lab 02/19/11 2247  LABPROT 14.1  INR 1.07   Micro Results: No results found for this or any previous visit (from the past 240 hour(s)). Studies/Results: X-ray Chest Pa And Lateral  02/20/2011  *RADIOLOGY REPORT*  Clinical Data: TIA  CHEST - 2 VIEW  Comparison: None.  Findings: Lungs are clear. No pleural effusion or pneumothorax.  The heart is top normal in size.  Degenerative changes of the visualized thoracolumbar spine.  IMPRESSION: No evidence of acute cardiopulmonary disease.  Original Report Authenticated By: Charline Bills, M.D.   Ct Head Wo Contrast  02/19/2011  *RADIOLOGY REPORT*  Clinical Data: Sharp head pain for past 5 hours.  Abnormal gait and change in speech.  History mini strokes.  High blood pressure.  CT HEAD WITHOUT CONTRAST  Technique:  Contiguous axial images were obtained from the base of the skull through the vertex without contrast.  Comparison: 11/15/2010.  Findings: Remote small left caudate head infarct with adjacent small vessel  disease type changes.  Prominent perivascular space versus old lacunar infarct right basal ganglia.  No CT evidence of large acute infarct.  Small acute infarct cannot be excluded by CT.  No intracranial mass lesion detected on this unenhanced exam.  No hydrocephalus.  Partially empty sella.  Vascular calcifications.  IMPRESSION: Remote infarcts and small vessel disease type changes.  No CT evidence of large acute infarct or intracranial hemorrhage.  Original Report Authenticated By: Fuller Canada, M.D.   Mr Angiogram Head Wo Contrast  02/20/2011  *RADIOLOGY REPORT*  Clinical Data: Recurrent stroke  MRA HEAD WITHOUT CONTRAST  Technique: Angiographic images of the Circle of Willis were  obtained using MRA technique without intravenous contrast.  Comparison: Head CT 12/26  Findings: Both internal carotid arteries are widely patent into the brain.  The anterior and middle cerebral vessels are patent without proximal stenosis, aneurysm or vascular malformation.  Both vertebral arteries are patent to the basilar.  No basilar stenosis.  Posterior circulation branch vessels are patent.  IMPRESSION: Negative intracranial MR angiography of the large and medium-sized vessels.  Original Report Authenticated By: Thomasenia Sales, M.D.   Mri Brain Without Contrast  02/20/2011  *RADIOLOGY REPORT*  Clinical Data: Recurrent stroke.  Abnormal gait.  Speech disturbance.  MRI HEAD WITHOUT CONTRAST  Technique:  Multiplanar, multiecho pulse sequences of the brain and surrounding structures were obtained according to standard protocol without intravenous contrast.  Comparison: Head CT 12/26  Findings: Diffusion imaging shows a 3 cm region of acute infarction affecting the insular to left frontal cortical and subcortical brain.  There is mild swelling in the region of infarction but no shift.  No demonstrable blood products.  Elsewhere, the brainstem and cerebellum are unremarkable.  There are dilated perivascular spaces at the base of the brain.  There is an old infarction in the left caudate body and there are mild small vessel changes in the hemispheric deep white matter.  No mass lesion, hemorrhage, hydrocephalus or extra-axial collection.  No pituitary mass.  No inflammatory sinus disease.  IMPRESSION: Acute infarction in the left insular to frontal cortical and subcortical brain.  The area of involvement measures about 3 cm in size.  No evidence of hemorrhage or shift.  Original Report Authenticated By: Thomasenia Sales, M.D.   US Carotid Duplex Bilateral  02/20/2011  *RADIOLOGY REPORT*  Clinical Data: Right-sided weakness, hypertension, stroke, prior myocardial infarction, previous tobacco use  BILATERAL  CAROTID DUPLEX ULTRASOUND  Technique: Gray scale imaging, color Doppler and duplex ultrasound was performed of bilateral carotid and vertebral arteries in the neck.  Comparison:  Brain MRI  Criteria:  Quantification of carotid stenosis is based on velocity parameters that correlate the residual internal carotid diameter with NASCET-based stenosis levels, using the diameter of the distal internal carotid lumen as the denominator for stenosis measurement.  The following velocity measurements were obtained:                   PEAK SYSTOLIC/END DIASTOLIC RIGHT ICA:                        93/20cm/sec CCA:                        91/14cm/sec SYSTOLIC ICA/CCA RATIO:     1.0 DIASTOLIC ICA/CCA RATIO:    1.4 ECA:  130cm/sec  LEFT ICA:                        74/16cm/sec CCA:                        138/22cm/sec SYSTOLIC ICA/CCA RATIO:     0.5 DIASTOLIC ICA/CCA RATIO:    0.7 ECA:                        124cm/sec  Findings:  RIGHT CAROTID ARTERY: Scattered echogenic shadowing plaque is noted at the right carotid bulb and proximal ICA, producing less than 10% stenosis by gray scale criteria.  RIGHT VERTEBRAL ARTERY:  Antegrade  LEFT CAROTID ARTERY: Scattered echogenic shadowing plaque is noted at the left carotid bulb and proximal ICA, producing less than 10% stenosis by gray scale criteria.  LEFT VERTEBRAL ARTERY:  Antegrade  Waveform morphologies are within normal limits bilaterally.  IMPRESSION: No sonographic evidence for hemodynamically significant stenosis in either carotid arterial system.  Original Report Authenticated By: Harrel Lemon, M.D.   Scheduled Meds:   . sodium chloride   Intravenous STAT  . aspirin EC  325 mg Oral Daily  . dipyridamole-aspirin  1 capsule Oral BID  . enoxaparin  40 mg Subcutaneous Daily  . metoprolol  12.5 mg Oral BID  . olmesartan  40 mg Oral Daily  . polycarbophil  1,250 mg Oral Q breakfast  . simvastatin  40 mg Oral q1800  . DISCONTD: sodium chloride    Intravenous Once  . DISCONTD: metoprolol  50 mg Oral UD   Continuous Infusions:   . sodium chloride 0.9 % 1,000 mL with potassium chloride 20 mEq infusion 75 mL/hr at 02/20/11 0145   PRN Meds:.acetaminophen, bisacodyl, nitroGLYCERIN, ondansetron (ZOFRAN) IV, senna-docusate Assessment/Plan: Principal Problem:  *Stroke Active Problems:  DYSLIPIDEMIA  Essential hypertension, benign  CORONARY ATHEROSCLEROSIS NATIVE CORONARY ARTERY  Await echocardiogram. If negative, can discharge home on Aggrenox. Deficits have resolved.  LOS: 1 day   Joelee Snoke L 02/20/2011, 5:57 PM

## 2011-02-21 ENCOUNTER — Inpatient Hospital Stay (HOSPITAL_COMMUNITY): Payer: Medicare Other

## 2011-02-21 ENCOUNTER — Encounter (HOSPITAL_COMMUNITY): Payer: Self-pay | Admitting: Internal Medicine

## 2011-02-21 DIAGNOSIS — R112 Nausea with vomiting, unspecified: Secondary | ICD-10-CM | POA: Diagnosis not present

## 2011-02-21 DIAGNOSIS — K56600 Partial intestinal obstruction, unspecified as to cause: Secondary | ICD-10-CM | POA: Diagnosis not present

## 2011-02-21 LAB — HEPATIC FUNCTION PANEL
ALT: 13 U/L (ref 0–53)
AST: 14 U/L (ref 0–37)
Albumin: 3.9 g/dL (ref 3.5–5.2)
Alkaline Phosphatase: 85 U/L (ref 39–117)
Indirect Bilirubin: 1 mg/dL — ABNORMAL HIGH (ref 0.3–0.9)
Total Protein: 7.2 g/dL (ref 6.0–8.3)

## 2011-02-21 LAB — LIPASE, BLOOD: Lipase: 22 U/L (ref 11–59)

## 2011-02-21 MED ORDER — CLOPIDOGREL BISULFATE 75 MG PO TABS
75.0000 mg | ORAL_TABLET | Freq: Every day | ORAL | Status: DC
  Filled 2011-02-21: qty 1

## 2011-02-21 MED ORDER — CLOPIDOGREL BISULFATE 75 MG PO TABS: 75.0000 mg | ORAL_TABLET | Freq: Every day | ORAL | Status: AC

## 2011-02-21 MED ORDER — ONDANSETRON HCL 4 MG PO TABS: 4.0000 mg | ORAL_TABLET | Freq: Three times a day (TID) | ORAL | Status: AC | PRN

## 2011-02-21 MED ORDER — SIMVASTATIN 40 MG PO TABS: 40.0000 mg | ORAL_TABLET | Freq: Every day | ORAL | Status: DC

## 2011-02-21 MED ORDER — POLYETHYLENE GLYCOL 3350 17 GM/SCOOP PO POWD: 17.0000 g | ORAL | Status: AC

## 2011-02-21 NOTE — Progress Notes (Signed)
Patient d/c home with family Left floor via wheelchair accompanied by staff No c/o pain at d/c Verbalized understanding of d/c instructions, follow up appt, and new medications [name] [name], [name] [name]

## 2011-02-21 NOTE — Progress Notes (Signed)
Subjective: The patient complains of an episode of nausea and vomiting last night after eating chicken. He also had episode of nausea and vomiting after breakfast this morning. He does not complain of abdominal pain, chest pain, or shortness of breath. He has no pain with urination.  Objective: Vital signs in last 24 hours: Filed Vitals:   02/20/11 2024 02/20/11 2213 02/21/11 0157 02/21/11 0643  BP: 143/81 139/75 114/67 159/90  Pulse: 65 54 63 65  Temp: 97.5 F (36.4 C) 98.2 F (36.8 C) 98.1 F (36.7 C) 98.2 F (36.8 C)  TempSrc: Oral Oral Oral Oral  Resp: 20 20 20 20   Height:      Weight:      SpO2: 93% 96% 95% 96%    Intake/Output Summary (Last 24 hours) at 02/21/11 0957 Last data filed at 02/20/11 2100  Gross per 24 hour  Intake    480 ml  Output    300 ml  Net    180 ml    Weight change: 2.722 kg (6 lb)  Physical exam: General: Pleasant 70 year old Caucasian man sitting up in bed talking with his family, in no acute distress. Lungs: Are to auscultation bilaterally. Heart: S1, S2, with borderline bradycardia. Abdomen: Mildly obese, positive bowel sounds, soft, nontender, nondistended. Extremities: No pedal edema. Neurologic: He is alert and oriented x3. Cranial nerves II through XII are grossly intact. Pronator drift is negative. Strength globally is 5 over 5. Sensation globally is intact.  Lab Results: Basic Metabolic Panel:  Basename 02/20/11 0454 02/19/11 2247  NA 141 141  K 3.8 3.5  CL 107 105  CO2 26 28  GLUCOSE 124* 130*  BUN 11 11  CREATININE 0.67 0.79  CALCIUM 9.2 9.5  MG -- --  PHOS -- --   Liver Function Tests:  [facility] 02/19/11 2247  AST 16  ALT 16  ALKPHOS 80  BILITOT 0.8  PROT 7.1  ALBUMIN 3.9   No results found for this basename: LIPASE:2,AMYLASE:2 in the last 72 hours No results found for this basename: AMMONIA:2 in the last 72 hours CBC:  Basename 02/20/11 0454 02/19/11 2247  WBC 8.4 7.0  NEUTROABS -- 3.4  HGB 13.9 14.3  HCT  39.4 41.2  MCV 87.9 87.8  PLT 185 169   Cardiac Enzymes: No results found for this basename: CKTOTAL:3,CKMB:3,CKMBINDEX:3,TROPONINI:3 in the last 72 hours BNP: No results found for this basename: PROBNP:3 in the last 72 hours D-Dimer: No results found for this basename: DDIMER:2 in the last 72 hours CBG:  Basename 02/21/11 0740 02/20/11 2033 02/20/11 1621 02/20/11 1209 02/20/11 0733  GLUCAP 110* 129* 133* 91 99   Hemoglobin A1C:  Basename 02/20/11 0155  HGBA1C 5.6   Fasting Lipid Panel:  Basename 02/20/11 0454  CHOL 144  HDL 41  LDLCALC 74  TRIG 143  CHOLHDL 3.5  LDLDIRECT --   Thyroid Function Tests: No results found for this basename: TSH,T4TOTAL,FREET4,T3FREE,THYROIDAB in the last 72 hours Anemia Panel: No results found for this basename: VITAMINB12,FOLATE,FERRITIN,TIBC,IRON,RETICCTPCT in the last 72 hours Coagulation:  Basename 02/19/11 2247  LABPROT 14.1  INR 1.07   Urine Drug Screen: Drugs of Abuse  No results found for this basename: labopia, cocainscrnur, labbenz, amphetmu, thcu, labbarb    Alcohol Level: No results found for this basename: ETH:2 in the last 72 hours Labs:  Micro: No results found for this or any previous visit (from the past 240 hour(s)).  Studies/Results: X-ray Chest Pa And Lateral  02/20/2011  *RADIOLOGY REPORT*  Clinical  Data: TIA  CHEST - 2 VIEW  Comparison: None.  Findings: Lungs are clear. No pleural effusion or pneumothorax.  The heart is top normal in size.  Degenerative changes of the visualized thoracolumbar spine.  IMPRESSION: No evidence of acute cardiopulmonary disease.  Original Report Authenticated By: Charline Bills, M.D.   Ct Head Wo Contrast  02/19/2011  *RADIOLOGY REPORT*  Clinical Data: Sharp head pain for past 5 hours.  Abnormal gait and change in speech.  History mini strokes.  High blood pressure.  CT HEAD WITHOUT CONTRAST  Technique:  Contiguous axial images were obtained from the base of the skull through  the vertex without contrast.  Comparison: 11/15/2010.  Findings: Remote small left caudate head infarct with adjacent small vessel disease type changes.  Prominent perivascular space versus old lacunar infarct right basal ganglia.  No CT evidence of large acute infarct.  Small acute infarct cannot be excluded by CT.  No intracranial mass lesion detected on this unenhanced exam.  No hydrocephalus.  Partially empty sella.  Vascular calcifications.  IMPRESSION: Remote infarcts and small vessel disease type changes.  No CT evidence of large acute infarct or intracranial hemorrhage.  Original Report Authenticated By: Fuller Canada, M.D.   Mr Angiogram Head Wo Contrast  02/20/2011  *RADIOLOGY REPORT*  Clinical Data: Recurrent stroke  MRA HEAD WITHOUT CONTRAST  Technique: Angiographic images of the Circle of Willis were obtained using MRA technique without intravenous contrast.  Comparison: Head CT 12/26  Findings: Both internal carotid arteries are widely patent into the brain.  The anterior and middle cerebral vessels are patent without proximal stenosis, aneurysm or vascular malformation.  Both vertebral arteries are patent to the basilar.  No basilar stenosis.  Posterior circulation branch vessels are patent.  IMPRESSION: Negative intracranial MR angiography of the large and medium-sized vessels.  Original Report Authenticated By: Thomasenia Sales, M.D.   Mri Brain Without Contrast  02/20/2011  *RADIOLOGY REPORT*  Clinical Data: Recurrent stroke.  Abnormal gait.  Speech disturbance.  MRI HEAD WITHOUT CONTRAST  Technique:  Multiplanar, multiecho pulse sequences of the brain and surrounding structures were obtained according to standard protocol without intravenous contrast.  Comparison: Head CT 12/26  Findings: Diffusion imaging shows a 3 cm region of acute infarction affecting the insular to left frontal cortical and subcortical brain.  There is mild swelling in the region of infarction but no shift.  No  demonstrable blood products.  Elsewhere, the brainstem and cerebellum are unremarkable.  There are dilated perivascular spaces at the base of the brain.  There is an old infarction in the left caudate body and there are mild small vessel changes in the hemispheric deep white matter.  No mass lesion, hemorrhage, hydrocephalus or extra-axial collection.  No pituitary mass.  No inflammatory sinus disease.  IMPRESSION: Acute infarction in the left insular to frontal cortical and subcortical brain.  The area of involvement measures about 3 cm in size.  No evidence of hemorrhage or shift.  Original Report Authenticated By: Thomasenia Sales, M.D.   US Carotid Duplex Bilateral  02/20/2011  *RADIOLOGY REPORT*  Clinical Data: Right-sided weakness, hypertension, stroke, prior myocardial infarction, previous tobacco use  BILATERAL CAROTID DUPLEX ULTRASOUND  Technique: Gray scale imaging, color Doppler and duplex ultrasound was performed of bilateral carotid and vertebral arteries in the neck.  Comparison:  Brain MRI  Criteria:  Quantification of carotid stenosis is based on velocity parameters that correlate the residual internal carotid diameter with NASCET-based stenosis levels, using the  diameter of the distal internal carotid lumen as the denominator for stenosis measurement.  The following velocity measurements were obtained:                   PEAK SYSTOLIC/END DIASTOLIC RIGHT ICA:                        93/20cm/sec CCA:                        91/14cm/sec SYSTOLIC ICA/CCA RATIO:     1.0 DIASTOLIC ICA/CCA RATIO:    1.4 ECA:                        130cm/sec  LEFT ICA:                        74/16cm/sec CCA:                        138/22cm/sec SYSTOLIC ICA/CCA RATIO:     0.5 DIASTOLIC ICA/CCA RATIO:    0.7 ECA:                        124cm/sec  Findings:  RIGHT CAROTID ARTERY: Scattered echogenic shadowing plaque is noted at the right carotid bulb and proximal ICA, producing less than 10% stenosis by gray scale criteria.   RIGHT VERTEBRAL ARTERY:  Antegrade  LEFT CAROTID ARTERY: Scattered echogenic shadowing plaque is noted at the left carotid bulb and proximal ICA, producing less than 10% stenosis by gray scale criteria.  LEFT VERTEBRAL ARTERY:  Antegrade  Waveform morphologies are within normal limits bilaterally.  IMPRESSION: No sonographic evidence for hemodynamically significant stenosis in either carotid arterial system.  Original Report Authenticated By: Harrel Lemon, M.D.    Medications: I have reviewed the patient's current medications.  Assessment: Principal Problem:  *Stroke Active Problems:  DYSLIPIDEMIA  Essential hypertension, benign  CORONARY ATHEROSCLEROSIS NATIVE CORONARY ARTERY  Nausea & vomiting  1. 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 Aggrenox was started, but is possible that he is having nausea and vomiting from it. He does not appear to have any significant deficits. He has preserved LV function and his carotid ultrasound revealed no ICA stenosis.  Nausea and vomiting. Possibly secondary to Aggrenox. We will order additional studies for evaluation.  Hypertension. Currently stable. He may need a higher dose of metoprolol and Benicar, however, this should be assessed in the outpatient setting and not acutely in the setting of the stroke.  Dyslipidemia. Stable on Zocor.  Coronary artery disease. Stable.    Plan:  1. Symptomatic treatment for nausea and vomiting. Will change his diet to clear liquids. We'll order an x-ray of his abdomen, lipase, and hepatic function panel. Will stop aspirin and Aggrenox and start Plavix. Will reassess the patient this afternoon. If he is asymptomatic, will discharge to home.  LOS: 2 days   Lailyn Appelbaum 02/21/2011, 9:57 AM

## 2011-02-21 NOTE — Discharge Summary (Signed)
Physician Discharge Summary  [name] [name] MRN: [id number] DOB/AGE: 1940-02-25 70 y.o.  PCP: Dwana Melena, MD   Admit date: 02/19/2011 Discharge date: 02/21/2011  Discharge Diagnoses:  1. 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 Aspirin was discontinued in favor of Plavix. 2. Dysarthria and right-sided hemiparesis, both resolved at the time of discharge. 3. Early partial small bowel obstruction versus ileus. Clinically resolved at the time of discharge. 4. Hypertension. Antihypertensive medication doses may need to be titrated up in the outpatient setting. 5. Dyslipidemia. Pravastatin was discontinued in favor of Zocor in the setting of a new stroke. His fasting lipid profile revealed a total cholesterol of 144, HDL cholesterol of 41, LDL cholesterol of 74, and triglycerides of 143. 6. Coronary artery disease, remained stable.   Current Discharge Medication List    START taking these medications   Details  clopidogrel (PLAVIX) 75 MG tablet Take 1 tablet (75 mg total) by mouth daily with breakfast. Qty: 30 tablet, Refills: 3    ondansetron (ZOFRAN) 4 MG tablet Take 1 tablet (4 mg total) by mouth every 8 (eight) hours as needed for nausea. Qty: 20 tablet, Refills: 0    polyethylene glycol powder (GLYCOLAX) powder Take 17 g by mouth every other day. FOR PREVENTION OF CONSTIPATION. Qty: 255 g, Refills: 0    simvastatin (ZOCOR) 40 MG tablet Take 1 tablet (40 mg total) by mouth at bedtime. FOR HIGH CHOLESTEROL AND SECONDARY STROKE PREVENTION. Qty: 30 tablet, Refills: 3      CONTINUE these medications which have NOT CHANGED   Details  metoprolol (LOPRESSOR) 50 MG tablet Take 50 mg by mouth as directed. Take 1/2 tab bid '    Multiple Vitamin (MULTIVITAMIN) tablet Take 1 tablet by mouth daily.      olmesartan (BENICAR) 40 MG tablet Take 40 mg by mouth daily.      polycarbophil (FIBERCON) 625 MG tablet Take 1,250 mg by mouth daily.      nitroGLYCERIN (NITROSTAT) 0.4 MG SL  tablet Place 0.4 mg under the tongue every 5 (five) minutes as needed.        STOP taking these medications     aspirin EC 325 MG tablet      pravastatin (PRAVACHOL) 80 MG tablet         Discharge Condition: Improved and stable.  Disposition: Home.    Consults: None   Significant Diagnostic Studies: X-ray Chest Pa And Lateral  02/20/2011  *RADIOLOGY REPORT*  Clinical Data: TIA  CHEST - 2 VIEW  Comparison: None.  Findings: Lungs are clear. No pleural effusion or pneumothorax.  The heart is top normal in size.  Degenerative changes of the visualized thoracolumbar spine.  IMPRESSION: No evidence of acute cardiopulmonary disease.  Original Report Authenticated By: Charline Bills, M.D.   Ct Head Wo Contrast  02/19/2011  *RADIOLOGY REPORT*  Clinical Data: Sharp head pain for past 5 hours.  Abnormal gait and change in speech.  History mini strokes.  High blood pressure.  CT HEAD WITHOUT CONTRAST  Technique:  Contiguous axial images were obtained from the base of the skull through the vertex without contrast.  Comparison: 11/15/2010.  Findings: Remote small left caudate head infarct with adjacent small vessel disease type changes.  Prominent perivascular space versus old lacunar infarct right basal ganglia.  No CT evidence of large acute infarct.  Small acute infarct cannot be excluded by CT.  No intracranial mass lesion detected on this unenhanced exam.  No hydrocephalus.  Partially empty sella.  Vascular calcifications.  IMPRESSION: Remote infarcts and small vessel disease type changes.  No CT evidence of large acute infarct or intracranial hemorrhage.  Original Report Authenticated By: Fuller Canada, M.D.   Mr Angiogram Head Wo Contrast  02/20/2011  *RADIOLOGY REPORT*  Clinical Data: Recurrent stroke  MRA HEAD WITHOUT CONTRAST  Technique: Angiographic images of the Circle of Willis were obtained using MRA technique without intravenous contrast.  Comparison: Head CT 12/26  Findings: Both  internal carotid arteries are widely patent into the brain.  The anterior and middle cerebral vessels are patent without proximal stenosis, aneurysm or vascular malformation.  Both vertebral arteries are patent to the basilar.  No basilar stenosis.  Posterior circulation branch vessels are patent.  IMPRESSION: Negative intracranial MR angiography of the large and medium-sized vessels.  Original Report Authenticated By: Thomasenia Sales, M.D.   Mri Brain Without Contrast  02/20/2011  *RADIOLOGY REPORT*  Clinical Data: Recurrent stroke.  Abnormal gait.  Speech disturbance.  MRI HEAD WITHOUT CONTRAST  Technique:  Multiplanar, multiecho pulse sequences of the brain and surrounding structures were obtained according to standard protocol without intravenous contrast.  Comparison: Head CT 12/26  Findings: Diffusion imaging shows a 3 cm region of acute infarction affecting the insular to left frontal cortical and subcortical brain.  There is mild swelling in the region of infarction but no shift.  No demonstrable blood products.  Elsewhere, the brainstem and cerebellum are unremarkable.  There are dilated perivascular spaces at the base of the brain.  There is an old infarction in the left caudate body and there are mild small vessel changes in the hemispheric deep white matter.  No mass lesion, hemorrhage, hydrocephalus or extra-axial collection.  No pituitary mass.  No inflammatory sinus disease.  IMPRESSION: Acute infarction in the left insular to frontal cortical and subcortical brain.  The area of involvement measures about 3 cm in size.  No evidence of hemorrhage or shift.  Original Report Authenticated By: Thomasenia Sales, M.D.   US Carotid Duplex Bilateral  02/20/2011  *RADIOLOGY REPORT*  Clinical Data: Right-sided weakness, hypertension, stroke, prior myocardial infarction, previous tobacco use  BILATERAL CAROTID DUPLEX ULTRASOUND  Technique: Gray scale imaging, color Doppler and duplex ultrasound was  performed of bilateral carotid and vertebral arteries in the neck.  Comparison:  Brain MRI  Criteria:  Quantification of carotid stenosis is based on velocity parameters that correlate the residual internal carotid diameter with NASCET-based stenosis levels, using the diameter of the distal internal carotid lumen as the denominator for stenosis measurement.  The following velocity measurements were obtained:                   PEAK SYSTOLIC/END DIASTOLIC RIGHT ICA:                        93/20cm/sec CCA:                        91/14cm/sec SYSTOLIC ICA/CCA RATIO:     1.0 DIASTOLIC ICA/CCA RATIO:    1.4 ECA:                        130cm/sec  LEFT ICA:                        74/16cm/sec CCA:  138/22cm/sec SYSTOLIC ICA/CCA RATIO:     0.5 DIASTOLIC ICA/CCA RATIO:    0.7 ECA:                        124cm/sec  Findings:  RIGHT CAROTID ARTERY: Scattered echogenic shadowing plaque is noted at the right carotid bulb and proximal ICA, producing less than 10% stenosis by gray scale criteria.  RIGHT VERTEBRAL ARTERY:  Antegrade  LEFT CAROTID ARTERY: Scattered echogenic shadowing plaque is noted at the left carotid bulb and proximal ICA, producing less than 10% stenosis by gray scale criteria.  LEFT VERTEBRAL ARTERY:  Antegrade  Waveform morphologies are within normal limits bilaterally.  IMPRESSION: No sonographic evidence for hemodynamically significant stenosis in either carotid arterial system.  Original Report Authenticated By: Harrel Lemon, M.D.   Dg Abd 2 Views  02/21/2011  *RADIOLOGY REPORT*  Clinical Data: Nausea and vomiting  ABDOMEN - 2 VIEW  Comparison: None.  Findings: There are dilated fluid and air filled loops of small intestine suggesting partial small bowel obstruction.  Some gas and stool does remain in the colon.  No abnormal calcifications. Chronic degenerative changes effect the spine.  IMPRESSION: Partial or early small bowel obstruction.  No visible free air.  Original Report  Authenticated By: Thomasenia Sales, M.D.   ECHO: Study Conclusions  - Left ventricle: The cavity size was normal. Mild septal hypertrophy. Systolic function was normal. The estimated ejection fraction was in the range of 55% to 60%. Wall motion was normal; there were no regional wall motion abnormalities. - Aortic valve: Mildly calcified annulus. Trileaflet; normal thickness, mildly calcified leaflets. Transthoracic echocardiography. M-mode, complete 2D, spectral Doppler, and color Doppler. Height: Height: 175.3cm. Height: 69in. Weight: Weight: 84.4kg. Weight: 185.6lb. Body mass index: BMI: 27.5kg/m^2. Body surface area: BSA: 54m^2. Patient status: Inpatient. Location: Bedside.      Microbiology: No results found for this or any previous visit (from the past 240 hour(s)).   Labs: Results for orders placed during the hospital encounter of 02/19/11 (from the past 48 hour(s))  CBC     Status: Normal   Collection Time   02/19/11 10:47 PM      Component Value Range Comment   WBC 7.0  4.0 - 10.5 (K/uL)    RBC 4.69  4.22 - 5.81 (MIL/uL)    Hemoglobin 14.3  13.0 - 17.0 (g/dL)    HCT 16.1  09.6 - 04.5 (%)    MCV 87.8  78.0 - 100.0 (fL)    MCH 30.5  26.0 - 34.0 (pg)    MCHC 34.7  30.0 - 36.0 (g/dL)    RDW 40.9  81.1 - 91.4 (%)    Platelets 169  150 - 400 (K/uL)   DIFFERENTIAL     Status: Normal   Collection Time   02/19/11 10:47 PM      Component Value Range Comment   Neutrophils Relative 48  43 - 77 (%)    Neutro Abs 3.4  1.7 - 7.7 (K/uL)    Lymphocytes Relative 40  12 - 46 (%)    Lymphs Abs 2.8  0.7 - 4.0 (K/uL)    Monocytes Relative 8  3 - 12 (%)    Monocytes Absolute 0.6  0.1 - 1.0 (K/uL)    Eosinophils Relative 4  0 - 5 (%)    Eosinophils Absolute 0.3  0.0 - 0.7 (K/uL)    Basophils Relative 0  0 - 1 (%)    Basophils Absolute  0.0  0.0 - 0.1 (K/uL)   COMPREHENSIVE METABOLIC PANEL     Status: Abnormal   Collection Time   02/19/11 10:47 PM      Component Value Range  Comment   Sodium 141  135 - 145 (mEq/L)    Potassium 3.5  3.5 - 5.1 (mEq/L)    Chloride 105  96 - 112 (mEq/L)    CO2 28  19 - 32 (mEq/L)    Glucose, Bld 130 (*) 70 - 99 (mg/dL)    BUN 11  6 - 23 (mg/dL)    Creatinine, Ser 9.60  0.50 - 1.35 (mg/dL)    Calcium 9.5  8.4 - 10.5 (mg/dL)    Total Protein 7.1  6.0 - 8.3 (g/dL)    Albumin 3.9  3.5 - 5.2 (g/dL)    AST 16  0 - 37 (U/L)    ALT 16  0 - 53 (U/L)    Alkaline Phosphatase 80  39 - 117 (U/L)    Total Bilirubin 0.8  0.3 - 1.2 (mg/dL)    GFR calc non Af Amer 88 (*) >90 (mL/min)    GFR calc Af Amer >90  >90 (mL/min)   PROTIME-INR     Status: Normal   Collection Time   02/19/11 10:47 PM      Component Value Range Comment   Prothrombin Time 14.1  11.6 - 15.2 (seconds)    INR 1.07  0.00 - 1.49    HEMOGLOBIN A1C     Status: Normal   Collection Time   02/20/11  1:55 AM      Component Value Range Comment   Hemoglobin A1C 5.6  <5.7 (%)    Mean Plasma Glucose 114  <117 (mg/dL)   APTT     Status: Normal   Collection Time   02/20/11  1:55 AM      Component Value Range Comment   aPTT 31  24 - 37 (seconds)   LIPID PANEL     Status: Normal   Collection Time   02/20/11  4:54 AM      Component Value Range Comment   Cholesterol 144  0 - 200 (mg/dL)    Triglycerides 454  <150 (mg/dL)    HDL 41  >09 (mg/dL)    Total CHOL/HDL Ratio 3.5      VLDL 29  0 - 40 (mg/dL)    LDL Cholesterol 74  0 - 99 (mg/dL)   BASIC METABOLIC PANEL     Status: Abnormal   Collection Time   02/20/11  4:54 AM      Component Value Range Comment   Sodium 141  135 - 145 (mEq/L)    Potassium 3.8  3.5 - 5.1 (mEq/L)    Chloride 107  96 - 112 (mEq/L)    CO2 26  19 - 32 (mEq/L)    Glucose, Bld 124 (*) 70 - 99 (mg/dL)    BUN 11  6 - 23 (mg/dL)    Creatinine, Ser 8.11  0.50 - 1.35 (mg/dL)    Calcium 9.2  8.4 - 10.5 (mg/dL)    GFR calc non Af Amer >90  >90 (mL/min)    GFR calc Af Amer >90  >90 (mL/min)   CBC     Status: Normal   Collection Time   02/20/11  4:54 AM        Component Value Range Comment   WBC 8.4  4.0 - 10.5 (K/uL)    RBC 4.48  4.22 - 5.81 (  MIL/uL)    Hemoglobin 13.9  13.0 - 17.0 (g/dL)    HCT 40.9  81.1 - 91.4 (%)    MCV 87.9  78.0 - 100.0 (fL)    MCH 31.0  26.0 - 34.0 (pg)    MCHC 35.3  30.0 - 36.0 (g/dL)    RDW 78.2  95.6 - 21.3 (%)    Platelets 185  150 - 400 (K/uL)   GLUCOSE, CAPILLARY     Status: Normal   Collection Time   02/20/11  7:33 AM      Component Value Range Comment   Glucose-Capillary 99  70 - 99 (mg/dL)    Comment 1 Documented in Chart     GLUCOSE, CAPILLARY     Status: Normal   Collection Time   02/20/11 12:09 PM      Component Value Range Comment   Glucose-Capillary 91  70 - 99 (mg/dL)    Comment 1 Notify RN     GLUCOSE, CAPILLARY     Status: Abnormal   Collection Time   02/20/11  4:21 PM      Component Value Range Comment   Glucose-Capillary 133 (*) 70 - 99 (mg/dL)    Comment 1 Notify RN     GLUCOSE, CAPILLARY     Status: Abnormal   Collection Time   02/20/11  8:33 PM      Component Value Range Comment   Glucose-Capillary 129 (*) 70 - 99 (mg/dL)    Comment 1 Notify RN      Comment 2 Documented in Chart     GLUCOSE, CAPILLARY     Status: Abnormal   Collection Time   02/21/11  7:40 AM      Component Value Range Comment   Glucose-Capillary 110 (*) 70 - 99 (mg/dL)   LIPASE, BLOOD     Status: Normal   Collection Time   02/21/11 10:32 AM      Component Value Range Comment   Lipase 22  11 - 59 (U/L)   HEPATIC FUNCTION PANEL     Status: Abnormal   Collection Time   02/21/11 10:32 AM      Component Value Range Comment   Total Protein 7.2  6.0 - 8.3 (g/dL)    Albumin 3.9  3.5 - 5.2 (g/dL)    AST 14  0 - 37 (U/L)    ALT 13  0 - 53 (U/L)    Alkaline Phosphatase 85  39 - 117 (U/L)    Total Bilirubin 1.2  0.3 - 1.2 (mg/dL)    Bilirubin, Direct 0.2  0.0 - 0.3 (mg/dL)    Indirect Bilirubin 1.0 (*) 0.3 - 0.9 (mg/dL)   GLUCOSE, CAPILLARY     Status: Abnormal   Collection Time   02/21/11 11:49 AM       Component Value Range Comment   Glucose-Capillary 117 (*) 70 - 99 (mg/dL)   GLUCOSE, CAPILLARY     Status: Abnormal   Collection Time   02/21/11  4:40 PM      Component Value Range Comment   Glucose-Capillary 112 (*) 70 - 99 (mg/dL)      HPI : The patient is a 70 year old man with a past medical history significant for coronary artery disease with previous coronary artery stenting, hypertension, and dyslipidemia. He presented to the emergency department on 02/19/2011 with a chief complaint of difficulty speaking and right sided weakness. In the emergency department, he was noted to be afebrile and hemodynamically stable. His lab data were  virtually normal. The CT scan of his head revealed remote infarcts and small vessel type changes but no CT evidence of an acute infarction. His EKG revealed normal sinus rhythm with a heart rate of 61 beats per minute and nonspecific ST and T wave abnormalities. He was admitted for further evaluation and management.  HOSPITAL COURSE: At the time of the initial assessment, most of not all of his complaints of dysarthria and right-sided weakness had resolved. He was preliminarily diagnosed with a TIA. He had been on aspirin. Aspirin was continued but Aggrenox was added. He was maintained on his chronic antihypertensive medications and statin therapy. For further evaluation, a number of studies were ordered. The MRI of his brain revealed an acute infarction in the left insular to frontal cortical and subcortical brain. The MRA of his head revealed no stenosis of large or medium sized vessels. The carotid ultrasound revealed no significant ICA stenosis. The 2-D echocardiogram revealed preserved left ventricular systolic function with an ejection fraction of 55-60% and no left ventricular regional wall motion abnormalities. He was evaluated by the physical therapist. Per her assessment, the patient had no evidence of deficits in his strength balance or coordination. She  stated that there were no further physical therapy needs.  On the night prior to the day of discharge, the patient developed an episode of nausea and vomiting after eating dinner. He was treated appropriately with antiemetics. He had no complaints of abdominal pain. He had no complaints of pain with urination. The next morning, after eating breakfast, he vomited again. His diet was changed to clear liquids. The cause Aggrenox can cause nausea vomiting, it was thought to be a possible culprit. It was discontinued in favor of Plavix. His symptoms prompted further evaluation. His liver transaminases and lipase were within normal limits. The x-ray of his abdomen revealed a partial small bowel obstruction. Following the x-ray of his abdomen, he had 3 bowel movements. His diet was advanced to a full liquid diet. He tolerated both the clear liquid diet and full liquid diet without a return of nausea and vomiting. Clinically, it appeared that his partial small bowel obstruction versus ileus had resolved. He was advised on avoiding constipation by taking laxatives daily or every other day.  The patient was instructed on his medication changes including discontinuation of aspirin in favor of Plavix and discontinuation of pravastatin in favor of simvastatin in the setting of a new stroke. He was maintained on metoprolol and Benicar at their previous home doses. His blood pressure generally ranged from 114/67-159/90. At the time of discharge, his blood pressure was 132/74. He may need further adjusting in his antihypertensive medications for better all around control of his blood pressure, however, this adjusting will be deferred to his primary care physician.   Discharge Exam: Blood pressure 132/74, pulse 70, temperature 98.5 F (36.9 C), temperature source Oral, resp. rate 20, height 5\' 9"  (1.753 m), weight 84.369 kg (186 lb), SpO2 95.00%.  Lungs: Clear to auscultation bilaterally. Heart: S1, S2, with a soft  systolic murmur. Abdomen: Mildly obese, positive bowel sounds, nontender, nondistended. Extremities: No pedal edema. Neurologic: Alert and oriented x3. Cranial nerves II through XII are intact. Gait is within normal limits.   Discharge Orders    Future Orders Please Complete By Expires   Diet - low sodium heart healthy      Increase activity slowly      Discharge instructions      Comments:   NOTE MEDICATION CHANGES AS  OUTLINED.  EAT A LOW FAT/LOW SALT DIET AND SOUPS OVER THE NEXT 2-3 DAYS.      Follow-up Information    Follow up with dr Timothy Lasso hall on 02/27/2011. (9:45)           Total discharge time: 40 minutes.  Signed: Ovila Lepage 02/21/2011, 6:33 PM

## 2011-02-21 NOTE — Progress Notes (Signed)
Patient ambulated unassisted to nurses station and back with no complaints. Patient did however become nauseous and vomit shortly after. Refused medication. Patient stated he felt better after having the episode of emesis. Will continue to monitor.

## 2011-02-24 NOTE — Progress Notes (Signed)
Discharge summary sent to payer through [facility]  

## 2011-06-02 ENCOUNTER — Encounter: Payer: Self-pay | Admitting: Cardiology

## 2011-09-18 ENCOUNTER — Ambulatory Visit: Payer: Medicare Other | Admitting: Cardiology

## 2011-10-15 ENCOUNTER — Ambulatory Visit (INDEPENDENT_AMBULATORY_CARE_PROVIDER_SITE_OTHER): Payer: Medicare Other | Admitting: Cardiology

## 2011-10-15 ENCOUNTER — Encounter: Payer: Self-pay | Admitting: Cardiology

## 2011-10-15 VITALS — BP 124/70 | HR 57 | Ht 69.0 in | Wt 185.1 lb

## 2011-10-15 DIAGNOSIS — I2510 Atherosclerotic heart disease of native coronary artery without angina pectoris: Secondary | ICD-10-CM

## 2011-10-15 DIAGNOSIS — E785 Hyperlipidemia, unspecified: Secondary | ICD-10-CM

## 2011-10-15 DIAGNOSIS — I10 Essential (primary) hypertension: Secondary | ICD-10-CM

## 2011-10-15 NOTE — Patient Instructions (Addendum)
Your physician recommends that you schedule a follow-up appointment in: 1 year  

## 2011-10-15 NOTE — Assessment & Plan Note (Signed)
Lipids have been well controlled on statin therapy. 

## 2011-10-15 NOTE — Assessment & Plan Note (Signed)
Symptomatically stable on medical therapy. Medications reviewed. ECG also reviewed. Continue observation on an annual basis, routine follow up with Dr. Margo Aye.

## 2011-10-15 NOTE — Progress Notes (Signed)
   Clinical Summary Mr. Sudol is a 71 y.o.male presenting for followup. I have not seen him in the office since May of 2011, although he has had interval followup with other providers. Interval history reviewed including stroke back in December 2012. He is now on Plavix.  He reports no angina or progressive shortness of breath, states that his right-sided deficits with stroke have completely resolved.  Myoview from July 2012 showed moderate anteroseptal/apical scar with no residual ischemia, LVEF 59%. Lab work in December showed total cholesterol 144, cholesterol is 143, HDL 41, LDL 74. ECG today shows sinus bradycardia with poor R wave progression.   Allergies  Allergen Reactions  . Penicillins     REACTION: nausea    Current Outpatient Prescriptions  Medication Sig Dispense Refill  . clopidogrel (PLAVIX) 75 MG tablet Take 1 tablet (75 mg total) by mouth daily with breakfast.  30 tablet  3  . metoprolol (LOPRESSOR) 50 MG tablet Take 50 mg by mouth as directed. Take 1/2 tab bid '      . Multiple Vitamin (MULTIVITAMIN) tablet Take 1 tablet by mouth daily.        . nitroGLYCERIN (NITROSTAT) 0.4 MG SL tablet Place 0.4 mg under the tongue every 5 (five) minutes as needed.        Marland Kitchen olmesartan (BENICAR) 40 MG tablet Take 40 mg by mouth daily.        . polycarbophil (FIBERCON) 625 MG tablet Take 1,250 mg by mouth daily.        . simvastatin (ZOCOR) 40 MG tablet Take 1 tablet (40 mg total) by mouth at bedtime. FOR HIGH CHOLESTEROL AND SECONDARY STROKE PREVENTION.  30 tablet  3    Past Medical History  Diagnosis Date  . Coronary atherosclerosis of native coronary artery     PTCA/stent to LAD and diagonal 2001, LVEF 55%  . Hyperlipidemia   . Essential hypertension, benign   . Drug intolerance     ACE inhibitor; related to cough   . MI (myocardial infarction)   . CVA (cerebral vascular accident) 01/2011    Resolved dysarthria and right hemiparesis  . Partial small bowel obstruction  12//2012    Resolved spontaneously.    Social History Mr. Gallo reports that he has quit smoking. His smoking use included Cigarettes. He has never used smokeless tobacco. Mr. Muilenburg reports that he does not drink alcohol.  Review of Systems No palpitations or syncope. No reported bleeding problems. Reports compliance with his medications. Otherwise negative.  Physical Examination Filed Vitals:   10/15/11 1429  BP: 124/70  Pulse: 57   Normally nourished male in NAD. HEENT: Conjunctiva and lids normal, oropharynx clear. Neck: Supple, no elevated JVP or carotid bruits, no thyromegaly. Lungs: Clear to auscultation, nonlabored breathing at rest. Cardiac: Regular rate and rhythm, no S3 or significant systolic murmur, no pericardial rub. Abdomen: Soft, nontender, bowel sounds present, no guarding or rebound. Extremities: No pitting edema, distal pulses 2+.    Problem List and Plan   CORONARY ATHEROSCLEROSIS NATIVE CORONARY ARTERY Symptomatically stable on medical therapy. Medications reviewed. ECG also reviewed. Continue observation on an annual basis, routine follow up with Dr. Margo Aye.  Essential hypertension, benign Blood pressure control is good today.  DYSLIPIDEMIA Lipids have been well controlled on statin therapy.    Jonelle Sidle, M.D., F.A.C.C.

## 2011-10-15 NOTE — Assessment & Plan Note (Signed)
Blood pressure control is good today. 

## 2012-10-21 ENCOUNTER — Encounter: Payer: Self-pay | Admitting: Cardiology

## 2012-10-21 ENCOUNTER — Ambulatory Visit (INDEPENDENT_AMBULATORY_CARE_PROVIDER_SITE_OTHER): Payer: Medicare Other | Admitting: Cardiology

## 2012-10-21 VITALS — BP 149/80 | HR 55 | Ht 69.0 in | Wt 192.0 lb

## 2012-10-21 DIAGNOSIS — I10 Essential (primary) hypertension: Secondary | ICD-10-CM

## 2012-10-21 DIAGNOSIS — I2510 Atherosclerotic heart disease of native coronary artery without angina pectoris: Secondary | ICD-10-CM

## 2012-10-21 DIAGNOSIS — E785 Hyperlipidemia, unspecified: Secondary | ICD-10-CM

## 2012-10-21 NOTE — Assessment & Plan Note (Signed)
Blood pressure is mildly elevated. Discussed medications, diet and walking regimen.

## 2012-10-21 NOTE — Progress Notes (Signed)
   Clinical Summary Mr. Woodmansee is a 72 y.o.male last seen in August 2013. Since last year he reports no significant angina symptoms, denies any cardiac hospitalizations. He reports compliance with his medications, has lipid followup with Dr. Margo Aye.  ECG today shows sinus bradycardia, poor R-wave progression anteriorly suggests prior anterior infarct.  We have in managing him medically with history of CAD. Previous Myoview in June 2012 showed LVEF 59% with anteroseptal/apical scar, no definite ischemia.  Allergies  Allergen Reactions  . Penicillins     REACTION: nausea    Current Outpatient Prescriptions  Medication Sig Dispense Refill  . aspirin EC 81 MG tablet Take 81 mg by mouth daily.      . clobetasol cream (TEMOVATE) 0.05 %       . clopidogrel (PLAVIX) 75 MG tablet       . Garlic 1000 MG CAPS Take 1 capsule by mouth daily.      . metoprolol (LOPRESSOR) 50 MG tablet Take 50 mg by mouth as directed. Take 1/2 tab bid '      . Multiple Vitamin (MULTIVITAMIN) tablet Take 1 tablet by mouth daily.        . nitroGLYCERIN (NITROSTAT) 0.4 MG SL tablet Place 0.4 mg under the tongue every 5 (five) minutes as needed.        Marland Kitchen olmesartan (BENICAR) 40 MG tablet Take 40 mg by mouth daily.        . polycarbophil (FIBERCON) 625 MG tablet Take 1,250 mg by mouth daily.        . simvastatin (ZOCOR) 40 MG tablet Take 1 tablet (40 mg total) by mouth at bedtime. FOR HIGH CHOLESTEROL AND SECONDARY STROKE PREVENTION.  30 tablet  3   No current facility-administered medications for this visit.    Past Medical History  Diagnosis Date  . Coronary atherosclerosis of native coronary artery     PTCA/stent to LAD and diagonal 2001, LVEF 55%  . Hyperlipidemia   . Essential hypertension, benign   . Drug intolerance     ACE inhibitor; related to cough   . MI (myocardial infarction)   . CVA (cerebral vascular accident) 01/2011    Resolved dysarthria and right hemiparesis  . Partial small bowel obstruction  12//2012    Resolved spontaneously.    Social History Mr. Schillaci reports that he has quit smoking. His smoking use included Cigarettes. He smoked 0.00 packs per day. He has never used smokeless tobacco. Mr. Lerette reports that he does not drink alcohol.  Review of Systems No palpitations, dizziness, syncope. No orthopnea or PND. No leg edema.  Physical Examination Filed Vitals:   10/21/12 0849  BP: 149/80  Pulse: 55   Filed Weights   10/21/12 0849  Weight: 192 lb (87.091 kg)   Normally nourished male in NAD.  HEENT: Conjunctiva and lids normal, oropharynx clear.  Neck: Supple, no elevated JVP or carotid bruits, no thyromegaly.  Lungs: Clear to auscultation, nonlabored breathing at rest.  Cardiac: Regular rate and rhythm, no S3 or significant systolic murmur, no pericardial rub.  Abdomen: Soft, nontender, bowel sounds present, no guarding or rebound.  Extremities: No pitting edema, distal pulses 2+.   Problem List and Plan   CORONARY ATHEROSCLEROSIS NATIVE CORONARY ARTERY No active angina symptoms. Continue medical therapy and observation.  DYSLIPIDEMIA Continues on Zocor.  Essential hypertension, benign Blood pressure is mildly elevated. Discussed medications, diet and walking regimen.    Jonelle Sidle, M.D., F.A.C.C.

## 2012-10-21 NOTE — Assessment & Plan Note (Signed)
No active angina symptoms. Continue medical therapy and observation. 

## 2012-10-21 NOTE — Assessment & Plan Note (Signed)
Continues on Zocor. 

## 2012-10-21 NOTE — Patient Instructions (Addendum)
Your physician recommends that you schedule a follow-up appointment in: ONE YEAR  Your physician recommends that you continue on your current medications as directed. Please refer to the Current Medication list given to you today.  

## 2013-06-16 ENCOUNTER — Ambulatory Visit (INDEPENDENT_AMBULATORY_CARE_PROVIDER_SITE_OTHER): Payer: Medicare Other | Admitting: Otolaryngology

## 2013-06-16 DIAGNOSIS — R040 Epistaxis: Secondary | ICD-10-CM

## 2013-08-27 ENCOUNTER — Emergency Department (HOSPITAL_COMMUNITY)
Admission: EM | Admit: 2013-08-27 | Discharge: 2013-08-27 | Disposition: A | Discharge status: Home / Self Care | Payer: Medicare Other | Attending: Emergency Medicine | Admitting: Emergency Medicine

## 2013-08-27 ENCOUNTER — Encounter (HOSPITAL_COMMUNITY): Payer: Self-pay | Admitting: Emergency Medicine

## 2013-08-27 ENCOUNTER — Emergency Department (HOSPITAL_COMMUNITY): Payer: Medicare Other

## 2013-08-27 DIAGNOSIS — I10 Essential (primary) hypertension: Secondary | ICD-10-CM | POA: Insufficient documentation

## 2013-08-27 DIAGNOSIS — Z7982 Long term (current) use of aspirin: Secondary | ICD-10-CM | POA: Insufficient documentation

## 2013-08-27 DIAGNOSIS — Z7902 Long term (current) use of antithrombotics/antiplatelets: Secondary | ICD-10-CM | POA: Insufficient documentation

## 2013-08-27 DIAGNOSIS — Z8673 Personal history of transient ischemic attack (TIA), and cerebral infarction without residual deficits: Secondary | ICD-10-CM | POA: Insufficient documentation

## 2013-08-27 DIAGNOSIS — Z8719 Personal history of other diseases of the digestive system: Secondary | ICD-10-CM | POA: Insufficient documentation

## 2013-08-27 DIAGNOSIS — Z880 Allergy status to penicillin: Secondary | ICD-10-CM | POA: Insufficient documentation

## 2013-08-27 DIAGNOSIS — I2510 Atherosclerotic heart disease of native coronary artery without angina pectoris: Secondary | ICD-10-CM | POA: Insufficient documentation

## 2013-08-27 DIAGNOSIS — Z79899 Other long term (current) drug therapy: Secondary | ICD-10-CM | POA: Insufficient documentation

## 2013-08-27 DIAGNOSIS — R112 Nausea with vomiting, unspecified: Secondary | ICD-10-CM

## 2013-08-27 DIAGNOSIS — R5381 Other malaise: Secondary | ICD-10-CM | POA: Insufficient documentation

## 2013-08-27 DIAGNOSIS — I252 Old myocardial infarction: Secondary | ICD-10-CM | POA: Insufficient documentation

## 2013-08-27 DIAGNOSIS — R5383 Other fatigue: Secondary | ICD-10-CM

## 2013-08-27 DIAGNOSIS — E785 Hyperlipidemia, unspecified: Secondary | ICD-10-CM | POA: Insufficient documentation

## 2013-08-27 DIAGNOSIS — Z87891 Personal history of nicotine dependence: Secondary | ICD-10-CM | POA: Insufficient documentation

## 2013-08-27 DIAGNOSIS — R509 Fever, unspecified: Secondary | ICD-10-CM

## 2013-08-27 LAB — URINALYSIS, ROUTINE W REFLEX MICROSCOPIC
Bilirubin Urine: NEGATIVE
Glucose, UA: NEGATIVE mg/dL
Hgb urine dipstick: NEGATIVE
Ketones, ur: NEGATIVE mg/dL
Leukocytes, UA: NEGATIVE
Nitrite: NEGATIVE
Protein, ur: NEGATIVE mg/dL
Specific Gravity, Urine: 1.015 (ref 1.005–1.030)
Urobilinogen, UA: 0.2 mg/dL (ref 0.0–1.0)
pH: 6 (ref 5.0–8.0)

## 2013-08-27 LAB — COMPREHENSIVE METABOLIC PANEL
ALBUMIN: 4.1 g/dL (ref 3.5–5.2)
ALK PHOS: 88 U/L (ref 39–117)
ALT: 19 U/L (ref 0–53)
AST: 23 U/L (ref 0–37)
Anion gap: 14 (ref 5–15)
BILIRUBIN TOTAL: 1 mg/dL (ref 0.3–1.2)
BUN: 17 mg/dL (ref 6–23)
CHLORIDE: 99 meq/L (ref 96–112)
CO2: 22 mEq/L (ref 19–32)
CREATININE: 0.88 mg/dL (ref 0.50–1.35)
Calcium: 8.9 mg/dL (ref 8.4–10.5)
GFR calc Af Amer: >90 mL/min (ref >90)
GFR calc non Af Amer: 82 mL/min — ABNORMAL LOW (ref >90)
Glucose, Bld: 159 mg/dL — ABNORMAL HIGH (ref 70–99)
POTASSIUM: 4.3 meq/L (ref 3.7–5.3)
SODIUM: 135 meq/L — AB (ref 137–147)
Total Protein: 7.1 g/dL (ref 6.0–8.3)

## 2013-08-27 LAB — CBC WITH DIFFERENTIAL/PLATELET
BASOS ABS: 0 10*3/uL (ref 0.0–0.1)
BASOS PCT: 0 % (ref 0–1)
Eosinophils Absolute: 0 10*3/uL (ref 0.0–0.7)
Eosinophils Relative: 0 % (ref 0–5)
HCT: 39.2 % (ref 39.0–52.0)
Hemoglobin: 14.2 g/dL (ref 13.0–17.0)
Lymphocytes Relative: 13 % (ref 12–46)
Lymphs Abs: 0.8 10*3/uL (ref 0.7–4.0)
MCH: 30.6 pg (ref 26.0–34.0)
MCHC: 36.2 g/dL — AB (ref 30.0–36.0)
MCV: 84.5 fL (ref 78.0–100.0)
Monocytes Absolute: 0.5 10*3/uL (ref 0.1–1.0)
Monocytes Relative: 8 % (ref 3–12)
NEUTROS ABS: 4.7 10*3/uL (ref 1.7–7.7)
NEUTROS PCT: 79 % — AB (ref 43–77)
PLATELETS: 139 10*3/uL — AB (ref 150–400)
RBC: 4.64 MIL/uL (ref 4.22–5.81)
RDW: 12.8 % (ref 11.5–15.5)
WBC: 6 10*3/uL (ref 4.0–10.5)

## 2013-08-27 MED ORDER — SODIUM CHLORIDE 0.9 % IV BOLUS (SEPSIS)
1000.0000 mL | Freq: Once | INTRAVENOUS | Status: AC
  Administered 2013-08-27: 1000 mL via INTRAVENOUS

## 2013-08-27 MED ORDER — ONDANSETRON HCL 4 MG/2ML IJ SOLN: 4.0000 mg | Freq: Once | INTRAMUSCULAR | Status: DC

## 2013-08-27 MED ORDER — ONDANSETRON HCL 4 MG PO TABS: 4.0000 mg | ORAL_TABLET | Freq: Four times a day (QID) | ORAL | Status: DC

## 2013-08-27 NOTE — ED Notes (Signed)
Pt states he noticed fever yesterday with RLQ pain. States vomited x 2 this morning. Denies pain today. States generalized weakness this morning.

## 2013-08-27 NOTE — ED Provider Notes (Signed)
CSN: [id number]     Arrival date & time 08/27/13  0945 History  This chart was scribed for Virgel Manifold, MD by Martinique Peace, ED Scribe. The patient was seen in [facility]. The patient's care was started at 10:15 AM.    Chief Complaint  Patient presents with  . Abdominal Pain      Patient is a 73 y.o. male presenting with abdominal pain. The history is provided by the patient. No language interpreter was used.  Abdominal Pain Associated symptoms: fever and vomiting   Associated symptoms: no cough, no dysuria, no hematuria and no shortness of breath   HPI Comments: [name] [name] is a 73 y.o. male who presents to the Emergency Department complaining of vomiting 2x this morning after eating breakfast with associated abdominal pain, generalized weakness, and high fever all day yesterday. Pt denies any one around him being sick to his knowledge or experiencing any urinary problems. He further denies any coughing, SOB, or rashes on his extremities. Pt is a former smoker. His PCP is Dr. Nevada Crane.   Past Medical History  Diagnosis Date  . Coronary atherosclerosis of native coronary artery     PTCA/stent to LAD and diagonal 2001, LVEF 55%  . Hyperlipidemia   . Essential hypertension, benign   . Drug intolerance     ACE inhibitor; related to cough   . MI (myocardial infarction)   . CVA (cerebral vascular accident) 01/2011    Resolved dysarthria and right hemiparesis  . Partial small bowel obstruction 12//2012    Resolved spontaneously.   Past Surgical History  Procedure Laterality Date  . Polypectomy  2005   Family History  Problem Relation Age of Onset  . Cancer Father   . Heart failure Father   . Hyperlipidemia Father   . Diabetes Sister   . Hypertension Sister   . Cancer Mother   . Cancer Brother   . Cancer Sister    History  Substance Use Topics  . Smoking status: Former Smoker    Types: Cigarettes  . Smokeless tobacco: Never Used  . Alcohol Use: No    Review of Systems   Constitutional: Positive for fever.  Respiratory: Negative for cough and shortness of breath.   Gastrointestinal: Positive for vomiting and abdominal pain.  Genitourinary: Negative for dysuria, frequency, hematuria, decreased urine volume and difficulty urinating.  Skin: Negative for rash.  Neurological: Positive for weakness.  All other systems reviewed and are negative.     Allergies  Penicillins  Home Medications   Prior to Admission medications   Medication Sig Start Date End Date Taking? Authorizing Provider  aspirin EC 81 MG tablet Take 81 mg by mouth every other day.    Yes Historical Provider, MD  clopidogrel (PLAVIX) 75 MG tablet Take 75 mg by mouth daily.  08/18/12  Yes Historical Provider, MD  metoprolol (LOPRESSOR) 50 MG tablet Take 25 mg by mouth 2 (two) times daily. Take 1/2 tab bid '   Yes Historical Provider, MD  Multiple Vitamin (MULTIVITAMIN) tablet Take 1 tablet by mouth daily.     Yes Historical Provider, MD  polycarbophil (FIBERCON) 625 MG tablet Take 1,250 mg by mouth daily.     Yes Historical Provider, MD  Polyethyl Glycol-Propyl Glycol (SYSTANE OP) Apply 1 drop to eye daily as needed (Dry Eyes).   Yes Historical Provider, MD  simvastatin (ZOCOR) 40 MG tablet Take 40 mg by mouth daily.   Yes Historical Provider, MD  nitroGLYCERIN (NITROSTAT) 0.4 MG SL tablet  Place 0.4 mg under the tongue every 5 (five) minutes as needed.      Historical Provider, MD  ondansetron (ZOFRAN) 4 MG tablet Take 1 tablet (4 mg total) by mouth every 6 (six) hours. 08/27/13   Virgel Manifold, MD   Triage Vitals: BP 120/70  Pulse 84  Temp(Src) 99.4 F (37.4 C) (Oral)  Resp 14  SpO2 95% Physical Exam  Nursing note and vitals reviewed. Constitutional: He is oriented to person, place, and time. He appears well-developed and well-nourished. No distress.  Appears tired, but not toxic  HENT:  Head: Normocephalic and atraumatic.  Eyes: Conjunctivae and EOM are normal.  Neck: Neck supple.  No tracheal deviation present.  Cardiovascular: Normal rate, regular rhythm and normal heart sounds.   Pulmonary/Chest: Effort normal. No respiratory distress.  Abdominal: Soft. Bowel sounds are normal. He exhibits no distension. There is no tenderness.  Genitourinary:  No CVA tenderness  Musculoskeletal: Normal range of motion.  Neurological: He is alert and oriented to person, place, and time.  Skin: Skin is warm and dry. No rash noted.  Psychiatric: He has a normal mood and affect. His behavior is normal.    ED Course  Procedures (including critical care time) DIAGNOSTIC STUDIES: Oxygen Saturation is 95% on room air, adequate by my interpretation.    COORDINATION OF CARE: 10:20 AM- Treatment plan was discussed with patient who verbalizes understanding and agrees.     Labs Review Labs Reviewed  CBC WITH DIFFERENTIAL - Abnormal; Notable for the following:    MCHC 36.2 (*)    Platelets 139 (*)    Neutrophils Relative % 79 (*)    All other components within normal limits  COMPREHENSIVE METABOLIC PANEL - Abnormal; Notable for the following:    Sodium 135 (*)    Glucose, Bld 159 (*)    GFR calc non Af Amer 82 (*)    All other components within normal limits  URINALYSIS, ROUTINE W REFLEX MICROSCOPIC    Imaging Review No results found.  Dg Chest 2 View  08/27/2013   CLINICAL DATA:  Fever and weakness.  EXAM: CHEST  2 VIEW  COMPARISON:  02/20/2011.  FINDINGS: The cardiac silhouette, mediastinal and hilar contours are within normal limits and stable. The lungs are clear of acute process. No pleural effusion. The bony thorax is intact. Stable degenerative changes involving the thoracic spine.  IMPRESSION: No acute cardiopulmonary findings.   Electronically Signed   By: Kalman Jewels M.D.   On: 08/27/2013 10:54    EKG Interpretation None     Medications  ondansetron (ZOFRAN) injection 4 mg (not administered)  sodium chloride 0.9 % bolus 1,000 mL (1,000 mLs Intravenous New  Bag/Given 08/27/13 1040)    MDM   Final diagnoses:  Non-intractable vomiting with nausea, vomiting of unspecified type  Fever, unspecified fever cause    73yM with subjective fever and generalized fatigue. Possibly viral illness? Nontoxic. HD stable. UA clean. CXR clear. Labs unremarkable. Abdominal exam in benign. I feel appropriate for discharge. Plan symptomatic tx at this time. Return precautions discussed.   I personally preformed the services scribed in my presence. The recorded information has been reviewed is accurate. Virgel Manifold, MD.    Virgel Manifold, MD 09/06/13 [phone]

## 2013-10-21 ENCOUNTER — Encounter: Payer: Self-pay | Admitting: Cardiology

## 2013-10-21 ENCOUNTER — Ambulatory Visit (INDEPENDENT_AMBULATORY_CARE_PROVIDER_SITE_OTHER): Payer: Medicare Other | Admitting: Cardiology

## 2013-10-21 ENCOUNTER — Encounter: Payer: Self-pay | Admitting: *Deleted

## 2013-10-21 VITALS — BP 130/78 | HR 60 | Ht 69.0 in | Wt 182.0 lb

## 2013-10-21 DIAGNOSIS — E785 Hyperlipidemia, unspecified: Secondary | ICD-10-CM

## 2013-10-21 DIAGNOSIS — I2510 Atherosclerotic heart disease of native coronary artery without angina pectoris: Secondary | ICD-10-CM

## 2013-10-21 DIAGNOSIS — I10 Essential (primary) hypertension: Secondary | ICD-10-CM

## 2013-10-21 MED ORDER — NITROGLYCERIN 0.4 MG SL SUBL: 0.4000 mg | SUBLINGUAL_TABLET | SUBLINGUAL | Status: DC | PRN

## 2013-10-21 NOTE — Patient Instructions (Signed)
Your physician wants you to follow-up in: 1 year You will receive a reminder letter in the mail two months in advance. If you don't receive a letter, please call our office to schedule the follow-up appointment.   Your physician recommends that you continue on your current medications as directed. Please refer to the Current Medication list given to you today.    I refilled your nitroglycerine today   [name] [name] will assist you with enrolling in MyChart     Thank you for choosing [facility] !

## 2013-10-21 NOTE — Assessment & Plan Note (Signed)
No change in current regimen. 

## 2013-10-21 NOTE — Progress Notes (Signed)
Clinical Summary [name] [name] is a 73 y.o.male last seen in August 2014. He states that he has been doing well, no active angina symptoms. He got married back in January. Reports no other major health changes, still following with [name] [name].  ECG today shows sinus bradycardia.  We have in managing him medically with history of CAD. Previous Myoview in June 2012 showed LVEF 59% with anteroseptal/apical scar, no definite ischemia.  We reviewed his medications. He states he lost his nitroglycerin, has not needed any.   Allergies  Allergen Reactions  . Penicillins Nausea Only    Current Outpatient Prescriptions  Medication Sig Dispense Refill  . aspirin EC 81 MG tablet Take 81 mg by mouth every other day.       . clopidogrel (PLAVIX) 75 MG tablet Take 75 mg by mouth daily.       . metoprolol (LOPRESSOR) 50 MG tablet Take 25 mg by mouth 2 (two) times daily. Take 1/2 tab bid '      . polycarbophil (FIBERCON) 625 MG tablet Take 1,250 mg by mouth daily.        . simvastatin (ZOCOR) 40 MG tablet Take 40 mg by mouth daily.      Marland Kitchen telmisartan (MICARDIS) 80 MG tablet Take 80 mg by mouth daily.      . nitroGLYCERIN (NITROSTAT) 0.4 MG SL tablet Place 1 tablet (0.4 mg total) under the tongue every 5 (five) minutes as needed.  25 tablet  1  . Polyethyl Glycol-Propyl Glycol (SYSTANE OP) Apply 1 drop to eye daily as needed (Dry Eyes).       No current facility-administered medications for this visit.    Past Medical History  Diagnosis Date  . Coronary atherosclerosis of native coronary artery     PTCA/stent to LAD and diagonal 2001, LVEF 55%  . Hyperlipidemia   . Essential hypertension, benign   . Drug intolerance     ACE inhibitor; related to cough   . MI (myocardial infarction)   . CVA (cerebral vascular accident) 01/2011    Resolved dysarthria and right hemiparesis  . Partial small bowel obstruction 12//2012    Resolved spontaneously.    Past Surgical History  Procedure Laterality  Date  . Polypectomy  2005    Family History  Problem Relation Age of Onset  . Cancer Father   . Heart failure Father   . Hyperlipidemia Father   . Diabetes Sister   . Hypertension Sister   . Cancer Mother   . Cancer Brother   . Cancer Sister     Social History [name] [name] reports that he quit smoking about 55 years ago. His smoking use included Cigarettes. He smoked 0.00 packs per day. He has never used smokeless tobacco. [name] [name] reports that he does not drink alcohol.  Review of Systems No palpitations, dizziness, syncope. No claudication.  Physical Examination Filed Vitals:   10/21/13 0849  BP: 130/78  Pulse: 60   Filed Weights   10/21/13 0849  Weight: 182 lb (82.555 kg)    Appears comfortable at rest. HEENT: Conjunctiva and lids normal, oropharynx clear.  Neck: Supple, no elevated JVP or carotid bruits, no thyromegaly.  Lungs: Clear to auscultation, nonlabored breathing at rest.  Cardiac: Regular rate and rhythm, no S3 or significant systolic murmur, no pericardial rub.  Abdomen: Soft, nontender, bowel sounds present, no guarding or rebound.  Extremities: No pitting edema, distal pulses 2+.   Problem List and Plan   CORONARY ATHEROSCLEROSIS NATIVE  CORONARY ARTERY Symptomatically stable. ECG reviewed and stable as well. Plan is to continue medical therapy and observation. Refill provided for nitroglycerin.  DYSLIPIDEMIA Lipids are followed by [name] [name], patient continues on Zocor.  Essential hypertension, benign No change in current regimen.    [name] [name], M.D., F.A.C.C.

## 2013-10-21 NOTE — Assessment & Plan Note (Signed)
Lipids are followed by Dr. Nevada Crane, patient continues on Zocor.

## 2013-10-21 NOTE — Assessment & Plan Note (Signed)
Symptomatically stable. ECG reviewed and stable as well. Plan is to continue medical therapy and observation. Refill provided for nitroglycerin.

## 2013-10-24 NOTE — Telephone Encounter (Signed)
Message copied by Bernita Raisin on Mon Oct 24, 2013 10:17 AM ------      Message from: MCDOWELL, Aloha Gell      Created: Mon Oct 24, 2013  8:35 AM       I reviewed his question. He is on Plavix, let him know that this is an antiplatelet agent rather than a blood thinner, and that we do not need to check an INR.            ----- Message -----         From: Desma Mcgregor, CMA         Sent: 10/24/2013   7:13 AM           To: Satira Sark, MD            Pt wanting to know if he needed INR checked at some point, but I don't see where he is on a blood thinner. Please advise       ------

## 2013-10-24 NOTE — Telephone Encounter (Signed)
I spoke with pt and explained dr.mcdowell's message

## 2014-07-14 ENCOUNTER — Encounter (HOSPITAL_COMMUNITY): Payer: Self-pay | Admitting: Emergency Medicine

## 2014-07-14 ENCOUNTER — Observation Stay (HOSPITAL_COMMUNITY): Payer: PPO

## 2014-07-14 ENCOUNTER — Emergency Department (HOSPITAL_COMMUNITY): Payer: PPO

## 2014-07-14 ENCOUNTER — Observation Stay (HOSPITAL_COMMUNITY)
Admission: EM | Admit: 2014-07-14 | Discharge: 2014-07-15 | Disposition: A | Discharge status: Home / Self Care | Payer: PPO | Attending: Internal Medicine | Admitting: Internal Medicine

## 2014-07-14 DIAGNOSIS — I252 Old myocardial infarction: Secondary | ICD-10-CM | POA: Diagnosis not present

## 2014-07-14 DIAGNOSIS — R531 Weakness: Secondary | ICD-10-CM | POA: Diagnosis present

## 2014-07-14 DIAGNOSIS — I10 Essential (primary) hypertension: Secondary | ICD-10-CM | POA: Diagnosis not present

## 2014-07-14 DIAGNOSIS — Z79899 Other long term (current) drug therapy: Secondary | ICD-10-CM | POA: Diagnosis not present

## 2014-07-14 DIAGNOSIS — Z7902 Long term (current) use of antithrombotics/antiplatelets: Secondary | ICD-10-CM | POA: Insufficient documentation

## 2014-07-14 DIAGNOSIS — E785 Hyperlipidemia, unspecified: Secondary | ICD-10-CM | POA: Insufficient documentation

## 2014-07-14 DIAGNOSIS — I2510 Atherosclerotic heart disease of native coronary artery without angina pectoris: Secondary | ICD-10-CM | POA: Diagnosis present

## 2014-07-14 DIAGNOSIS — Z87891 Personal history of nicotine dependence: Secondary | ICD-10-CM | POA: Diagnosis not present

## 2014-07-14 DIAGNOSIS — Z7982 Long term (current) use of aspirin: Secondary | ICD-10-CM | POA: Diagnosis not present

## 2014-07-14 DIAGNOSIS — G08 Intracranial and intraspinal phlebitis and thrombophlebitis: Secondary | ICD-10-CM | POA: Diagnosis not present

## 2014-07-14 DIAGNOSIS — M6289 Other specified disorders of muscle: Secondary | ICD-10-CM | POA: Diagnosis not present

## 2014-07-14 DIAGNOSIS — M6281 Muscle weakness (generalized): Secondary | ICD-10-CM | POA: Diagnosis present

## 2014-07-14 DIAGNOSIS — I639 Cerebral infarction, unspecified: Principal | ICD-10-CM | POA: Diagnosis present

## 2014-07-14 LAB — URINALYSIS, ROUTINE W REFLEX MICROSCOPIC
Bilirubin Urine: NEGATIVE
Glucose, UA: NEGATIVE mg/dL
Hgb urine dipstick: NEGATIVE
KETONES UR: NEGATIVE mg/dL
LEUKOCYTES UA: NEGATIVE
Nitrite: NEGATIVE
PH: 6 (ref 5.0–8.0)
Protein, ur: NEGATIVE mg/dL
Specific Gravity, Urine: 1.02 (ref 1.005–1.030)
Urobilinogen, UA: 0.2 mg/dL (ref 0.0–1.0)

## 2014-07-14 LAB — RAPID URINE DRUG SCREEN, HOSP PERFORMED
Amphetamines: NOT DETECTED
BARBITURATES: NOT DETECTED
BENZODIAZEPINES: NOT DETECTED
Cocaine: NOT DETECTED
Opiates: NOT DETECTED
Tetrahydrocannabinol: NOT DETECTED

## 2014-07-14 LAB — COMPREHENSIVE METABOLIC PANEL
ALT: 16 U/L — ABNORMAL LOW (ref 17–63)
ANION GAP: 8 (ref 5–15)
AST: 18 U/L (ref 15–41)
Albumin: 4.4 g/dL (ref 3.5–5.0)
Alkaline Phosphatase: 80 U/L (ref 38–126)
BILIRUBIN TOTAL: 1.3 mg/dL — AB (ref 0.3–1.2)
BUN: 16 mg/dL (ref 6–20)
CHLORIDE: 105 mmol/L (ref 101–111)
CO2: 27 mmol/L (ref 22–32)
Calcium: 8.8 mg/dL — ABNORMAL LOW (ref 8.9–10.3)
Creatinine, Ser: 0.84 mg/dL (ref 0.61–1.24)
GFR calc Af Amer: >60 mL/min (ref >60)
GFR calc non Af Amer: >60 mL/min (ref >60)
Glucose, Bld: 119 mg/dL — ABNORMAL HIGH (ref 65–99)
POTASSIUM: 3.8 mmol/L (ref 3.5–5.1)
SODIUM: 140 mmol/L (ref 135–145)
Total Protein: 7.4 g/dL (ref 6.5–8.1)

## 2014-07-14 LAB — DIFFERENTIAL
Basophils Absolute: 0 10*3/uL (ref 0.0–0.1)
Basophils Relative: 0 % (ref 0–1)
EOS ABS: 0.2 10*3/uL (ref 0.0–0.7)
Eosinophils Relative: 3 % (ref 0–5)
LYMPHS ABS: 3.1 10*3/uL (ref 0.7–4.0)
Lymphocytes Relative: 40 % (ref 12–46)
MONO ABS: 0.6 10*3/uL (ref 0.1–1.0)
MONOS PCT: 8 % (ref 3–12)
NEUTROS PCT: 49 % (ref 43–77)
Neutro Abs: 3.8 10*3/uL (ref 1.7–7.7)

## 2014-07-14 LAB — LIPID PANEL
CHOL/HDL RATIO: 4.4 ratio
Cholesterol: 158 mg/dL (ref 0–200)
HDL: 36 mg/dL — ABNORMAL LOW (ref >40)
LDL CALC: 95 mg/dL (ref 0–99)
Triglycerides: 136 mg/dL (ref <150)
VLDL: 27 mg/dL (ref 0–40)

## 2014-07-14 LAB — CBC
HEMATOCRIT: 41.5 % (ref 39.0–52.0)
HEMOGLOBIN: 14.4 g/dL (ref 13.0–17.0)
MCH: 30.6 pg (ref 26.0–34.0)
MCHC: 34.7 g/dL (ref 30.0–36.0)
MCV: 88.1 fL (ref 78.0–100.0)
Platelets: 213 10*3/uL (ref 150–400)
RBC: 4.71 MIL/uL (ref 4.22–5.81)
RDW: 12.7 % (ref 11.5–15.5)
WBC: 7.7 10*3/uL (ref 4.0–10.5)

## 2014-07-14 LAB — CBG MONITORING, ED: GLUCOSE-CAPILLARY: 106 mg/dL — AB (ref 65–99)

## 2014-07-14 LAB — I-STAT TROPONIN, ED: Troponin i, poc: 0 ng/mL (ref 0.00–0.08)

## 2014-07-14 LAB — APTT: aPTT: 27 seconds (ref 24–37)

## 2014-07-14 LAB — PROTIME-INR
INR: 1.13 (ref 0.00–1.49)
PROTHROMBIN TIME: 14.7 s (ref 11.6–15.2)

## 2014-07-14 LAB — TROPONIN I: Troponin I: <0.03 ng/mL (ref <0.031)

## 2014-07-14 MED ORDER — CLOPIDOGREL BISULFATE 75 MG PO TABS
75.0000 mg | ORAL_TABLET | Freq: Every day | ORAL | Status: DC
  Administered 2014-07-14 – 2014-07-15 (×2): 75 mg via ORAL
  Filled 2014-07-14 (×2): qty 1

## 2014-07-14 MED ORDER — ASPIRIN 300 MG RE SUPP
300.0000 mg | Freq: Every day | RECTAL | Status: DC
  Filled 2014-07-14 (×4): qty 1

## 2014-07-14 MED ORDER — CALCIUM POLYCARBOPHIL 625 MG PO TABS
1250.0000 mg | ORAL_TABLET | Freq: Every day | ORAL | Status: DC
  Administered 2014-07-14 – 2014-07-15 (×2): 1250 mg via ORAL
  Filled 2014-07-14 (×4): qty 2

## 2014-07-14 MED ORDER — STROKE: EARLY STAGES OF RECOVERY BOOK
Freq: Once | Status: AC
  Administered 2014-07-14: 13:00:00
  Filled 2014-07-14 (×2): qty 1

## 2014-07-14 MED ORDER — ASPIRIN 325 MG PO TABS
325.0000 mg | ORAL_TABLET | Freq: Every day | ORAL | Status: DC
Start: 2014-07-14 — End: 2014-07-15
  Administered 2014-07-14 – 2014-07-15 (×2): 325 mg via ORAL
  Filled 2014-07-14 (×2): qty 1

## 2014-07-14 MED ORDER — ACETAMINOPHEN 325 MG PO TABS: 650.0000 mg | ORAL_TABLET | ORAL | Status: DC | PRN

## 2014-07-14 MED ORDER — SENNOSIDES-DOCUSATE SODIUM 8.6-50 MG PO TABS: 1.0000 | ORAL_TABLET | Freq: Every evening | ORAL | Status: DC | PRN

## 2014-07-14 MED ORDER — ACETAMINOPHEN 650 MG RE SUPP: 650.0000 mg | RECTAL | Status: DC | PRN

## 2014-07-14 MED ORDER — SIMVASTATIN 20 MG PO TABS
40.0000 mg | ORAL_TABLET | Freq: Every day | ORAL | Status: DC
  Administered 2014-07-14: 40 mg via ORAL
  Filled 2014-07-14: qty 2

## 2014-07-14 NOTE — ED Notes (Signed)
Patient with c/o right sided weakness that he noticed upon awakening this morning at 0800. Patient states he was normal at 2130 last night when he went to bed and that he slept through the night. Right grip weaker than left. Right leg with drift, but does not hit bed in 5 seconds. No pronator drift. Face symmetrical. Speech normal per pt/family. Alert/oriented x 4.

## 2014-07-14 NOTE — H&P (Signed)
[facility] History and Physical  [name] [name] KXF:[id number] DOB: 1940-07-26 DOA: 07/14/2014  Referring physician: Ralene Bathe in ED PCP: Delphina Cahill, MD   Chief Complaint: right sided weakness  HPI: [name] [name] is a very pleasant 74 y.o. male with a past medical history of left brain stroke 2012 presents to the emergency department with chief complaint of right-sided weakness. Initial evaluation in the emergency department includes a CT of the head revealing new small lacunar infarct in right caudate nucleus. No mass lesion is noted on this unenhanced scan and concern for evolving ischemia. Patient reports he was in his usual state of health last night when he went to bed around 9:00 when he awakened this morning he experienced weakness in his right leg. Reports unable to bear weight and "had to drag it along". Associated symptoms include numbness and tingling of his right hand up to mid forearm. He denies right arm weakness. He denies headache visual disturbances dizziness syncope or near-syncope. He denies any difficulty chewing swallowing or speaking. He denies chest pain palpitation shortness of breath. He denies abdominal pain nausea vomiting diarrhea constipation melena. He denies dysuria hematuria frequency or urgency. There has been no fever chills or recent sick contacts.  Workup in the emergency department includes complete blood count is unremarkable basic metabolic panel significant for calcium of 8.8 and serum glucose of 119.  In the emergency department he is hemodynamically stable afebrile and not hypoxic. Portably he passed a bedside swallow eval.   Review of Systems:  10 point review of systems complete and all systems are negative except as indicated in the history of present illness Past Medical History  Diagnosis Date  . Coronary atherosclerosis of native coronary artery     PTCA/stent to LAD and diagonal 2001, LVEF 55%  . Hyperlipidemia   . Essential  hypertension, benign   . Drug intolerance     ACE inhibitor; related to cough   . MI (myocardial infarction)   . CVA (cerebral vascular accident) 01/2011    Resolved dysarthria and right hemiparesis  . Partial small bowel obstruction 12//2012    Resolved spontaneously.   Past Surgical History  Procedure Laterality Date  . Polypectomy  2005   Social History:  reports that he quit smoking about 55 years ago. His smoking use included Cigarettes. He has never used smokeless tobacco. He reports that he does not drink alcohol or use illicit drugs. Is married he lives at home with his wife he is independent with ADLs he is a retired Engineer, manufacturing systems Allergies  Allergen Reactions  . Penicillins Nausea Only    Family History  Problem Relation Age of Onset  . Cancer Father   . Heart failure Father   . Hyperlipidemia Father   . Diabetes Sister   . Hypertension Sister   . Cancer Mother   . Cancer Brother   . Cancer Sister      Prior to Admission medications   Medication Sig Start Date End Date Taking? Authorizing Provider  aspirin EC 81 MG tablet Take 81 mg by mouth every other day.     Historical Provider, MD  clopidogrel (PLAVIX) 75 MG tablet Take 75 mg by mouth daily.  08/18/12   Historical Provider, MD  metoprolol (LOPRESSOR) 50 MG tablet Take 25 mg by mouth 2 (two) times daily. Take 1/2 tab bid '    Historical Provider, MD  nitroGLYCERIN (NITROSTAT) 0.4 MG SL tablet Place 1 tablet (0.4 mg total) under the tongue every  5 (five) minutes as needed. 10/21/13   Satira Sark, MD  polycarbophil (FIBERCON) 625 MG tablet Take 1,250 mg by mouth daily.      Historical Provider, MD  Polyethyl Glycol-Propyl Glycol (SYSTANE OP) Apply 1 drop to eye daily as needed (Dry Eyes).    Historical Provider, MD  simvastatin (ZOCOR) 40 MG tablet Take 40 mg by mouth daily.    Historical Provider, MD  telmisartan (MICARDIS) 80 MG tablet Take 80 mg by mouth daily.    Historical Provider, MD   Physical  Exam: Filed Vitals:   07/14/14 0915 07/14/14 0930 07/14/14 0945 07/14/14 1000  BP: 159/97 169/75 151/69 174/63  Pulse: 54 61  58  Temp:      TempSrc:      Resp: 17 19 17 16   Height:      Weight:      SpO2: 98% 99%  89%    Wt Readings from Last 3 Encounters:  07/14/14 82.555 kg (182 lb)  07/14/14 82.555 kg (182 lb)  10/21/13 82.555 kg (182 lb)    General:  Appears calm and comfortable Eyes: PERRL, normal lids, irises & conjunctiva ENT: grossly normal hearing, lips & tongue Neck: no LAD, masses or thyromegaly Cardiovascular: RRR, no m/r/g. No LE edema. Respiratory: CTA bilaterally, no w/r/r. Normal respiratory effort. Abdomen: soft, ntnd no mass organomegaly noted Skin: no rash or induration seen on limited exam Musculoskeletal: grossly normal tone BUE/BLE Psychiatric: grossly normal mood and affect, speech fluent and appropriate Neurologic: grossly non-focal.  facial symmetry bilateral upper extremity strength 5 out of 5 lower extremity strength 5 out of 5 speech clear           Labs on Admission:  Basic Metabolic Panel:  Recent Labs Lab 07/14/14 0910  NA 140  K 3.8  CL 105  CO2 27  GLUCOSE 119*  BUN 16  CREATININE 0.84  CALCIUM 8.8*   Liver Function Tests:  Recent Labs Lab 07/14/14 0910  AST 18  ALT 16*  ALKPHOS 80  BILITOT 1.3*  PROT 7.4  ALBUMIN 4.4   No results for input(s): LIPASE, AMYLASE in the last 168 hours. No results for input(s): AMMONIA in the last 168 hours. CBC:  Recent Labs Lab 07/14/14 0910  WBC 7.7  NEUTROABS 3.8  HGB 14.4  HCT 41.5  MCV 88.1  PLT 213   Cardiac Enzymes: No results for input(s): CKTOTAL, CKMB, CKMBINDEX, TROPONINI in the last 168 hours.  BNP (last 3 results) No results for input(s): BNP in the last 8760 hours.  ProBNP (last 3 results) No results for input(s): PROBNP in the last 8760 hours.  CBG:  Recent Labs Lab 07/14/14 0906  GLUCAP 106*    Radiological Exams on Admission: Ct Head Wo  Contrast  07/14/2014   CLINICAL DATA:  Right arm and leg weakness starting this morning  EXAM: CT HEAD WITHOUT CONTRAST  TECHNIQUE: Contiguous axial images were obtained from the base of the skull through the vertex without intravenous contrast.  COMPARISON:  02/19/2011, history of CVA  FINDINGS: No skull fracture is noted  No intracranial hemorrhage, mass effect or midline shift. Again noted small lacunar infarct in left caudate nucleus. Stable lacunar infarct in right basal ganglia. There is interval lacunar infarct in left frontal lobe measures about 9 mm. There is area of decreased attenuation at the junction of left frontal with left parietal lobe adjacent to sylvian fissure axial image 19. Evolving ischemia cannot be excluded. Clinical correlation is necessary. Further correlation  with MRI is recommended as clinically warranted. New small lacunar infarct in right caudate nucleus. No mass lesion is noted on this unenhanced scan.  IMPRESSION: No intracranial hemorrhage, mass effect or midline shift. Again noted small lacunar infarct in left caudate nucleus. Stable lacunar infarct in right basal ganglia. There is interval lacunar infarct in left frontal lobe measures about 9 mm. There is small area of decreased attenuation at the junction of left frontal with left parietal lobe adjacent to sylvian fissure axial image 19. Evolving ischemia cannot be excluded. Clinical correlation is necessary. Further correlation with MRI is recommended as clinically warranted.  These results were called by telephone at the time of interpretation on 07/14/2014 at 10:03 am to Dr. Quintella Reichert , who verbally acknowledged these results.   Electronically Signed   By: Lahoma Crocker M.D.   On: 07/14/2014 10:03   Mr Brain Wo Contrast (neuro Protocol)  07/14/2014   CLINICAL DATA:  Right-sided extremity weakness.  History of stroke.  EXAM: MRI HEAD WITHOUT CONTRAST  TECHNIQUE: Multiplanar, multiecho pulse sequences of the brain and  surrounding structures were obtained without intravenous contrast.  COMPARISON:  Head CT 07/14/2014 and MRI 02/20/2011  FINDINGS: There is a small, acute infarct in the region of the posterior left corona radiata/posterior basal ganglia. A chronic infarct is seen in the left frontal operculum with associated chronic blood products. Chronic infarcts are also noted in the left frontal white matter and bilateral basal ganglia. There is no evidence of mass, midline shift, or extra-axial fluid collection. There is mild cerebral atrophy. Mild chronic small vessel ischemic changes are noted in the deep cerebral white matter bilaterally.  Orbits are unremarkable. No significant inflammatory changes seen in the paranasal sinuses or mastoid air cells. The left sigmoid sinus and left jugular bulb flow voids are abnormal, which represents a change from the prior MRI. Major intracranial arterial vascular flow voids are preserved.  IMPRESSION: 1. Small, acute posterior left basal ganglia/ deep white matter infarct. 2. Chronic infarcts in the left frontal lobe and bilateral basal ganglia. 3. New abnormal appearance of the left sigmoid sinus and left jugular bulb, suspicious for slow flow or occlusion due to thrombus and of uncertain acuity. MRV or CTV is suggested for further evaluation.   Electronically Signed   By: Logan Bores   On: 07/14/2014 11:14    EKG: Independently reviewed. Sinus rhythm  Assessment/Plan Principal Problem:   CVA (cerebral infarction): CT of the head with new lacunar infarct, MRI of the brain small, acute posterior left basal ganglia/ deep white matter infarct. Chronic infarcts in the left frontal lobe and bilateral basal ganglia. New abnormal appearance of the left sigmoid sinus and left jugular bulb, suspicious for slow flow or occlusion due to thrombus and of uncertain acuity. Will admit to telemetry. Patient already on Plavix and Zocor as well as aspirin. Will increase aspirin to 325 continuous  plavix and his Zocor. Will obtain a lipid panel 2-D echo carotid Dopplers. Check hemoglobin A1c. Will request PT and OT evaluation. Will also request neurology consult Active Problems:   Essential hypertension, benign: Home medications include Lopressor and telmisartan. I will hold these for now to allow for permissive hypertension. Monitor closely and resume as indicated    CORONARY ATHEROSCLEROSIS NATIVE CORONARY ARTERY: No chest pain. Medically managed per chart review. Her review indicates Myoview in 2012 showed moderate scarring with no residual ischemia and LVEF of 59%.    Right sided weakness: See #1. PT and OT  consult.    Code Status: full DVT Prophylaxis: Family Communication: wife at bedside Disposition Plan: home when ready  Time spent: 44 minutes  [facility] Pager [phone]

## 2014-07-14 NOTE — ED Provider Notes (Signed)
CSN: [id number]     Arrival date & time 07/14/14  0174 History  This chart was scribed for Quintella Reichert, MD by Thea Alken, ED Scribe. This patient was seen in room APA02/APA02 and the patient's care was started at 9:06 AM.   Chief Complaint  Patient presents with  . Extremity Weakness   The history is provided by the patient. No language interpreter was used.   HPI Comments:  [name] [name] is a 74 y.o. male who present to the Emergency Department complaining of right leg and right arm weakness onset this morning upon waking about 1 hour ago. Pt reports right arm and right leg are "not working properly" and states the last time right extremities were normal was 9:30 last night before going to bed. Pt has some numbness in right hand and is beginning to feel numbness to right arm and right leg with difficulty walking. Pt reports hx of CVA about 3-4 year and believes current symptoms are similar. He states during that time he lost his voice and could not speak properly.  He has hx of HTN. He takes plavix.  He denies DM. He is a former smoker and denies being a drinker.    Past Medical History  Diagnosis Date  . Coronary atherosclerosis of native coronary artery     PTCA/stent to LAD and diagonal 2001, LVEF 55%  . Hyperlipidemia   . Essential hypertension, benign   . Drug intolerance     ACE inhibitor; related to cough   . MI (myocardial infarction)   . CVA (cerebral vascular accident) 01/2011    Resolved dysarthria and right hemiparesis  . Partial small bowel obstruction 12//2012    Resolved spontaneously.   Past Surgical History  Procedure Laterality Date  . Polypectomy  2005   Family History  Problem Relation Age of Onset  . Cancer Father   . Heart failure Father   . Hyperlipidemia Father   . Diabetes Sister   . Hypertension Sister   . Cancer Mother   . Cancer Brother   . Cancer Sister    History  Substance Use Topics  . Smoking status: Former Smoker    Types: Cigarettes     Quit date: 10/22/1958  . Smokeless tobacco: Never Used  . Alcohol Use: No    Review of Systems  Musculoskeletal: Positive for gait problem.  Neurological: Positive for weakness and numbness.  All other systems reviewed and are negative.   Allergies  Penicillins  Home Medications   Prior to Admission medications   Medication Sig Start Date End Date Taking? Authorizing Provider  aspirin EC 81 MG tablet Take 81 mg by mouth every other day.     Historical Provider, MD  clopidogrel (PLAVIX) 75 MG tablet Take 75 mg by mouth daily.  08/18/12   Historical Provider, MD  metoprolol (LOPRESSOR) 50 MG tablet Take 25 mg by mouth 2 (two) times daily. Take 1/2 tab bid '    Historical Provider, MD  nitroGLYCERIN (NITROSTAT) 0.4 MG SL tablet Place 1 tablet (0.4 mg total) under the tongue every 5 (five) minutes as needed. 10/21/13   Satira Sark, MD  polycarbophil (FIBERCON) 625 MG tablet Take 1,250 mg by mouth daily.      Historical Provider, MD  Polyethyl Glycol-Propyl Glycol (SYSTANE OP) Apply 1 drop to eye daily as needed (Dry Eyes).    Historical Provider, MD  simvastatin (ZOCOR) 40 MG tablet Take 40 mg by mouth daily.    Historical Provider,  MD  telmisartan (MICARDIS) 80 MG tablet Take 80 mg by mouth daily.    Historical Provider, MD   BP 190/76 mmHg  Pulse 55  Temp(Src) 97.7 F (36.5 C) (Oral)  Resp 18  Ht 5\' 9"  (1.753 m)  Wt 182 lb (82.555 kg)  BMI 26.86 kg/m2  SpO2 97% Physical Exam  Constitutional: He is oriented to person, place, and time. He appears well-developed and well-nourished.  HENT:  Head: Normocephalic and atraumatic.  Eyes: EOM are normal. Pupils are equal, round, and reactive to light.  Cardiovascular: Normal rate and regular rhythm.   No murmur heard. Pulmonary/Chest: Effort normal and breath sounds normal. No respiratory distress.  Abdominal: Soft. There is no tenderness. There is no rebound and no guarding.  Musculoskeletal: He exhibits no edema or  tenderness.  Neurological: He is alert and oriented to person, place, and time. No cranial nerve deficit.  Mild RLE weakness, decreased sensation to light touch over RUE    Skin: Skin is warm and dry.  Psychiatric: He has a normal mood and affect. His behavior is normal.  Nursing note and vitals reviewed.   ED Course  Procedures  DIAGNOSTIC STUDIES: Oxygen Saturation is 97% on RA, normal by my interpretation.    COORDINATION OF CARE: 9:14 AM- Pt advised of plan for treatment and pt agrees.  Labs Review Labs Reviewed  COMPREHENSIVE METABOLIC PANEL - Abnormal; Notable for the following:    Glucose, Bld 119 (*)    Calcium 8.8 (*)    ALT 16 (*)    Total Bilirubin 1.3 (*)    All other components within normal limits  CBG MONITORING, ED - Abnormal; Notable for the following:    Glucose-Capillary 106 (*)    All other components within normal limits  PROTIME-INR  APTT  CBC  DIFFERENTIAL  URINE RAPID DRUG SCREEN (HOSP PERFORMED)  URINALYSIS, ROUTINE W REFLEX MICROSCOPIC  I-STAT TROPOININ, ED  CBG MONITORING, ED    Imaging Review Ct Head Wo Contrast  07/14/2014   CLINICAL DATA:  Right arm and leg weakness starting this morning  EXAM: CT HEAD WITHOUT CONTRAST  TECHNIQUE: Contiguous axial images were obtained from the base of the skull through the vertex without intravenous contrast.  COMPARISON:  02/19/2011, history of CVA  FINDINGS: No skull fracture is noted  No intracranial hemorrhage, mass effect or midline shift. Again noted small lacunar infarct in left caudate nucleus. Stable lacunar infarct in right basal ganglia. There is interval lacunar infarct in left frontal lobe measures about 9 mm. There is area of decreased attenuation at the junction of left frontal with left parietal lobe adjacent to sylvian fissure axial image 19. Evolving ischemia cannot be excluded. Clinical correlation is necessary. Further correlation with MRI is recommended as clinically warranted. New small  lacunar infarct in right caudate nucleus. No mass lesion is noted on this unenhanced scan.  IMPRESSION: No intracranial hemorrhage, mass effect or midline shift. Again noted small lacunar infarct in left caudate nucleus. Stable lacunar infarct in right basal ganglia. There is interval lacunar infarct in left frontal lobe measures about 9 mm. There is small area of decreased attenuation at the junction of left frontal with left parietal lobe adjacent to sylvian fissure axial image 19. Evolving ischemia cannot be excluded. Clinical correlation is necessary. Further correlation with MRI is recommended as clinically warranted.  These results were called by telephone at the time of interpretation on 07/14/2014 at 10:03 am to Dr. Quintella Reichert , who verbally acknowledged these  results.   Electronically Signed   By: Lahoma Crocker M.D.   On: 07/14/2014 10:03   Mr Brain Wo Contrast (neuro Protocol)  07/14/2014   CLINICAL DATA:  Right-sided extremity weakness.  History of stroke.  EXAM: MRI HEAD WITHOUT CONTRAST  TECHNIQUE: Multiplanar, multiecho pulse sequences of the brain and surrounding structures were obtained without intravenous contrast.  COMPARISON:  Head CT 07/14/2014 and MRI 02/20/2011  FINDINGS: There is a small, acute infarct in the region of the posterior left corona radiata/posterior basal ganglia. A chronic infarct is seen in the left frontal operculum with associated chronic blood products. Chronic infarcts are also noted in the left frontal white matter and bilateral basal ganglia. There is no evidence of mass, midline shift, or extra-axial fluid collection. There is mild cerebral atrophy. Mild chronic small vessel ischemic changes are noted in the deep cerebral white matter bilaterally.  Orbits are unremarkable. No significant inflammatory changes seen in the paranasal sinuses or mastoid air cells. The left sigmoid sinus and left jugular bulb flow voids are abnormal, which represents a change from the prior  MRI. Major intracranial arterial vascular flow voids are preserved.  IMPRESSION: 1. Small, acute posterior left basal ganglia/ deep white matter infarct. 2. Chronic infarcts in the left frontal lobe and bilateral basal ganglia. 3. New abnormal appearance of the left sigmoid sinus and left jugular bulb, suspicious for slow flow or occlusion due to thrombus and of uncertain acuity. MRV or CTV is suggested for further evaluation.   Electronically Signed   By: Logan Bores   On: 07/14/2014 11:14     EKG Interpretation   Date/Time:  Friday Jul 14 2014 09:04:52 EDT Ventricular Rate:  54 PR Interval:  186 QRS Duration: 97 QT Interval:  453 QTC Calculation: 429 R Axis:   13 Text Interpretation:  Sinus rhythm Low voltage, precordial leads  Anterolateral infarct, age indeterminate Confirmed by Hazle Coca [phone]) on  07/14/2014 9:22:15 AM      MDM   Final diagnoses:  Acute CVA (cerebrovascular accident)    Pt here for evaluation of new onset right sided weakness when he awoke this morning.  Pt does have mild weakness on examination.  Pt with acute CVA based on hx, imaging, he is not a TPA candidate due to onset of symptoms (last at his baseline 9pm last night).  Discussed with hospitalist regarding admission for further workup and management of CVA. Pt updated of findings of CT and concern for CVA, need for admission.    I personally performed the services described in this documentation, which was scribed in my presence. The recorded information has been reviewed and is accurate.    Quintella Reichert, MD 07/14/14 1124

## 2014-07-14 NOTE — ED Notes (Signed)
Pt in MRI.

## 2014-07-15 ENCOUNTER — Ambulatory Visit (INDEPENDENT_AMBULATORY_CARE_PROVIDER_SITE_OTHER)
Admit: 2014-07-15 | Discharge: 2014-07-15 | Disposition: A | Discharge status: Home / Self Care | Payer: PPO | Attending: Internal Medicine | Admitting: Internal Medicine

## 2014-07-15 DIAGNOSIS — I639 Cerebral infarction, unspecified: Principal | ICD-10-CM

## 2014-07-15 LAB — HEMOGLOBIN A1C
Hgb A1c MFr Bld: 6 % — ABNORMAL HIGH (ref 4.8–5.6)
Mean Plasma Glucose: 126 mg/dL

## 2014-07-15 LAB — ANTITHROMBIN III: AntiThromb III Func: 99 % (ref 75–120)

## 2014-07-15 MED ORDER — RIVAROXABAN (XARELTO) VTE STARTER PACK (15 & 20 MG): ORAL_TABLET | ORAL | Status: DC

## 2014-07-15 MED ORDER — SENNOSIDES-DOCUSATE SODIUM 8.6-50 MG PO TABS: 1.0000 | ORAL_TABLET | Freq: Every evening | ORAL | Status: DC | PRN

## 2014-07-15 MED ORDER — RIVAROXABAN 20 MG PO TABS: 20.0000 mg | ORAL_TABLET | Freq: Every day | ORAL | Status: DC

## 2014-07-15 MED ORDER — LORAZEPAM 2 MG/ML IJ SOLN: 0.5000 mg | Freq: Once | INTRAMUSCULAR | Status: DC

## 2014-07-15 MED ORDER — RIVAROXABAN 15 MG PO TABS: 15.0000 mg | ORAL_TABLET | Freq: Two times a day (BID) | ORAL | Status: DC

## 2014-07-15 NOTE — Progress Notes (Signed)
  Echocardiogram 2D Echocardiogram has been performed.  Kohen Reither L [place] [facility] 07/15/2014, 2:21 PM

## 2014-07-15 NOTE — Consult Note (Addendum)
Crosby A. Merlene Laughter, MD     [email or web address]          [name] [name] is an 74 y.o. male.   ASSESSMENT/PLAN: 1. 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 Risk factors hypertension, coronary artery disease and previous infarcts. 2. Multiple infarcts which raises the suspicion for cardioembolic phenomena. 3. Left cerebral venous thrombosis. No clear risk factors for this. This appears to likely be asymptomatic finding but should be treated. 4. Gait impairment due to #1. 5. Possible mild neuropathy right hand.  RECOMMENDATION: 1. Discontinue Plavix and continue aspirin 81 mg. 2. The patient should be placed on anticoagulations for 6 months. Afterwards, the MRV should be repeated. 3. Hypercoagulable workup will be followed. 4. 30 day event monitor. 5. Physical and occupational therapy.  The patient is an 74 year old right-handed white male who woke up yesterday morning with weakness of the right upper lower extremity. The patient had difficulties ambulating due to the weakness. He also reports having numbness of the right hand. The patient had a previous infarct about 4 years ago we you was worked up extensively. The patient has been on aspirin Plavix combination for known reasons. It is unclear how long he has been on this combination. Has been compliant with this. He also has been compliant with his other medications. The patient denies any other symptoms such as dysarthria, dysphagia, headaches, dizziness, chest pain, shortness of breath, GI GU symptoms. The review systems otherwise negative.  GENERAL: This a very pleasant male in no acute distress.  HEENT: Normal.  ABDOMEN: soft  EXTREMITIES: No edema   BACK: Normal  SKIN: Normal by inspection.    MENTAL STATUS: Alert and oriented including month and age. Speech, language and cognition are generally intact. Judgment and insight normal.  Naming,  fluency and comprehension are intact.  No neglect observed.  CRANIAL NERVES: Pupils are equal, round and reactive to light and accomodation; extra ocular movements are full, there is no significant nystagmus; visual fields are full; upper and lower facial muscles are normal in strength and symmetric, there is no flattening of the nasolabial folds; tongue is midline; uvula is midline; shoulder elevation is normal.  MOTOR: No drift observed. There is mild atrophy of the intrinsic hand muscles on the right. Right deltoid 5. Triceps 5. Right hip flexion 4+ and dorsiflexion 4+. The left side shows normal tone, bulk and strength.  COORDINATION: Left finger to nose is normal, right finger to nose is normal, No rest tremor; no intention tremor; no postural tremor; no bradykinesia.  REFLEXES: Deep tendon reflexes are symmetrical and normal.   SENSATION: Reduced sensation to light touch and temperature right hand.  GAIT: This is slightly unsteady.  NIH stroke scale 0-1.   Blood pressure 137/54, pulse 48, temperature 98.2 F (36.8 C), temperature source Oral, resp. rate 20, height 5' 9"  (1.753 m), weight 82.963 kg (182 lb 14.4 oz), SpO2 95 %.  Past Medical History  Diagnosis Date  . Coronary atherosclerosis of native coronary artery     PTCA/stent to LAD and diagonal 2001, LVEF 55%  . Hyperlipidemia   . Essential hypertension, benign   . Drug intolerance     ACE inhibitor; related to cough   . MI (myocardial infarction)   . CVA (cerebral vascular accident) 01/2011    Resolved dysarthria and right hemiparesis  . Partial small bowel obstruction 12//2012    Resolved spontaneously.  Marland Kitchen AICD (automatic cardioverter/defibrillator) present  Past Surgical History  Procedure Laterality Date  . Polypectomy  2005    Family History  Problem Relation Age of Onset  . Cancer Father   . Heart failure Father   . Hyperlipidemia Father   . Diabetes Sister   . Hypertension Sister   . Cancer Mother    . Cancer Brother   . Cancer Sister     Social History:  reports that he quit smoking about 55 years ago. His smoking use included Cigarettes. He has never used smokeless tobacco. He reports that he does not drink alcohol or use illicit drugs.  Allergies:  Allergies  Allergen Reactions  . Penicillins Nausea Only    Medications: Prior to Admission medications   Medication Sig Start Date End Date Taking? Authorizing Provider  aspirin EC 81 MG tablet Take 81 mg by mouth daily.    Yes Historical Provider, MD  clopidogrel (PLAVIX) 75 MG tablet Take 75 mg by mouth daily.  08/18/12  Yes Historical Provider, MD  metoprolol (LOPRESSOR) 50 MG tablet Take 25 mg by mouth 2 (two) times daily. Take 1/2 tab bid '   Yes Historical Provider, MD  nitroGLYCERIN (NITROSTAT) 0.4 MG SL tablet Place 1 tablet (0.4 mg total) under the tongue every 5 (five) minutes as needed. 10/21/13  Yes Satira Sark, MD  polycarbophil (FIBERCON) 625 MG tablet Take 1,250 mg by mouth daily.     Yes Historical Provider, MD  Polyethyl Glycol-Propyl Glycol (SYSTANE OP) Apply 1 drop to eye daily as needed (Dry Eyes).   Yes Historical Provider, MD  simvastatin (ZOCOR) 40 MG tablet Take 40 mg by mouth daily.   Yes Historical Provider, MD  telmisartan (MICARDIS) 80 MG tablet Take 80 mg by mouth daily.   Yes Historical Provider, MD    Scheduled Meds: . aspirin  300 mg Rectal Daily   Or  . aspirin  325 mg Oral Daily  . clopidogrel  75 mg Oral Daily  . polycarbophil  1,250 mg Oral Daily  . simvastatin  40 mg Oral q1800   Continuous Infusions:  PRN Meds:.acetaminophen **OR** acetaminophen, senna-docusate     Results for orders placed or performed during the hospital encounter of 07/14/14 (from the past 48 hour(s))  CBG monitoring, ED     Status: Abnormal   Collection Time: 07/14/14  9:06 AM  Result Value Ref Range   Glucose-Capillary 106 (H) 65 - 99 mg/dL  Protime-INR     Status: None   Collection Time: 07/14/14  9:10  AM  Result Value Ref Range   Prothrombin Time 14.7 11.6 - 15.2 seconds   INR 1.13 0.00 - 1.49  APTT     Status: None   Collection Time: 07/14/14  9:10 AM  Result Value Ref Range   aPTT 27 24 - 37 seconds  CBC     Status: None   Collection Time: 07/14/14  9:10 AM  Result Value Ref Range   WBC 7.7 4.0 - 10.5 K/uL   RBC 4.71 4.22 - 5.81 MIL/uL   Hemoglobin 14.4 13.0 - 17.0 g/dL   HCT 41.5 39.0 - 52.0 %   MCV 88.1 78.0 - 100.0 fL   MCH 30.6 26.0 - 34.0 pg   MCHC 34.7 30.0 - 36.0 g/dL   RDW 12.7 11.5 - 15.5 %   Platelets 213 150 - 400 K/uL  Differential     Status: None   Collection Time: 07/14/14  9:10 AM  Result Value Ref Range   Neutrophils Relative %  49 43 - 77 %   Neutro Abs 3.8 1.7 - 7.7 K/uL   Lymphocytes Relative 40 12 - 46 %   Lymphs Abs 3.1 0.7 - 4.0 K/uL   Monocytes Relative 8 3 - 12 %   Monocytes Absolute 0.6 0.1 - 1.0 K/uL   Eosinophils Relative 3 0 - 5 %   Eosinophils Absolute 0.2 0.0 - 0.7 K/uL   Basophils Relative 0 0 - 1 %   Basophils Absolute 0.0 0.0 - 0.1 K/uL  Comprehensive metabolic panel     Status: Abnormal   Collection Time: 07/14/14  9:10 AM  Result Value Ref Range   Sodium 140 135 - 145 mmol/L   Potassium 3.8 3.5 - 5.1 mmol/L   Chloride 105 101 - 111 mmol/L   CO2 27 22 - 32 mmol/L   Glucose, Bld 119 (H) 65 - 99 mg/dL   BUN 16 6 - 20 mg/dL   Creatinine, Ser 0.84 0.61 - 1.24 mg/dL   Calcium 8.8 (L) 8.9 - 10.3 mg/dL   Total Protein 7.4 6.5 - 8.1 g/dL   Albumin 4.4 3.5 - 5.0 g/dL   AST 18 15 - 41 U/L   ALT 16 (L) 17 - 63 U/L   Alkaline Phosphatase 80 38 - 126 U/L   Total Bilirubin 1.3 (H) 0.3 - 1.2 mg/dL   GFR calc non Af Amer >60 >60 mL/min   GFR calc Af Amer >60 >60 mL/min    Comment: (NOTE) The eGFR has been calculated using the CKD EPI equation. This calculation has not been validated in all clinical situations. eGFR's persistently <60 mL/min signify possible Chronic Kidney Disease.    Anion gap 8 5 - 15  Hemoglobin A1c     Status:  Abnormal   Collection Time: 07/14/14  9:10 AM  Result Value Ref Range   Hgb A1c MFr Bld 6.0 (H) 4.8 - 5.6 %    Comment: (NOTE)         Pre-diabetes: 5.7 - 6.4         Diabetes: >6.4         Glycemic control for adults with diabetes: <7.0    Mean Plasma Glucose 126 mg/dL    Comment: (NOTE) Performed At: [facility] [place], [place] [id number] Lindon Romp MD OV:[id number]   Lipid panel     Status: Abnormal   Collection Time: 07/14/14  9:10 AM  Result Value Ref Range   Cholesterol 158 0 - 200 mg/dL   Triglycerides 136 <150 mg/dL   HDL 36 (L) >40 mg/dL   Total CHOL/HDL Ratio 4.4 RATIO   VLDL 27 0 - 40 mg/dL   LDL Cholesterol 95 0 - 99 mg/dL    Comment:        Total Cholesterol/HDL:CHD Risk Coronary Heart Disease Risk Table                     Men   Women  1/2 Average Risk   3.4   3.3  Average Risk       5.0   4.4  2 X Average Risk   9.6   7.1  3 X Average Risk  23.4   11.0        Use the calculated Patient Ratio above and the CHD Risk Table to determine the patient's CHD Risk.        ATP III CLASSIFICATION (LDL):  <100     mg/dL   Optimal  100-129  mg/dL   Near or Above                    Optimal  130-159  mg/dL   Borderline  160-189  mg/dL   High  >190     mg/dL   Very High   I-stat troponin, ED (not at [facility], [facility])     Status: None   Collection Time: 07/14/14  9:20 AM  Result Value Ref Range   Troponin i, poc 0.00 0.00 - 0.08 ng/mL   Comment 3            Comment: Due to the release kinetics of cTnI, a negative result within the first hours of the onset of symptoms does not rule out myocardial infarction with certainty. If myocardial infarction is still suspected, repeat the test at appropriate intervals.   Urine Drug Screen     Status: None   Collection Time: 07/14/14 11:14 AM  Result Value Ref Range   Opiates NONE DETECTED NONE DETECTED   Cocaine NONE DETECTED NONE DETECTED   Benzodiazepines NONE DETECTED NONE DETECTED    Amphetamines NONE DETECTED NONE DETECTED   Tetrahydrocannabinol NONE DETECTED NONE DETECTED   Barbiturates NONE DETECTED NONE DETECTED    Comment:        DRUG SCREEN FOR MEDICAL PURPOSES ONLY.  IF CONFIRMATION IS NEEDED FOR ANY PURPOSE, NOTIFY LAB WITHIN 5 DAYS.        LOWEST DETECTABLE LIMITS FOR URINE DRUG SCREEN Drug Class       Cutoff (ng/mL) Amphetamine      1000 Barbiturate      200 Benzodiazepine   476 Tricyclics       546 Opiates          300 Cocaine          300 THC              50   Urinalysis, Routine w reflex microscopic     Status: None   Collection Time: 07/14/14 11:14 AM  Result Value Ref Range   Color, Urine YELLOW YELLOW   APPearance CLEAR CLEAR   Specific Gravity, Urine 1.020 1.005 - 1.030   pH 6.0 5.0 - 8.0   Glucose, UA NEGATIVE NEGATIVE mg/dL   Hgb urine dipstick NEGATIVE NEGATIVE   Bilirubin Urine NEGATIVE NEGATIVE   Ketones, ur NEGATIVE NEGATIVE mg/dL   Protein, ur NEGATIVE NEGATIVE mg/dL   Urobilinogen, UA 0.2 0.0 - 1.0 mg/dL   Nitrite NEGATIVE NEGATIVE   Leukocytes, UA NEGATIVE NEGATIVE    Comment: MICROSCOPIC NOT DONE ON URINES WITH NEGATIVE PROTEIN, BLOOD, LEUKOCYTES, NITRITE, OR GLUCOSE <1000 mg/dL.  Troponin I     Status: None   Collection Time: 07/14/14  5:55 PM  Result Value Ref Range   Troponin I <0.03 <0.031 ng/mL    Comment:        NO INDICATION OF MYOCARDIAL INJURY.     Studies/Results:  CAROTID DOPPLERS:  ICA: 87/17cm/sec  CCA: 503/54SF/KCL  SYSTOLIC ICA/CCA RATIO: 0.7  DIASTOLIC ICA/CCA RATIO: 1.0  ECA: 159cm/sec  LEFT  ICA: 181/38cm/sec  CCA: 154 /27NT/ZGY  SYSTOLIC ICA/CCA RATIO: 1.2  DIASTOLIC ICA/CCA RATIO: 1.2  ECA: 202cm/sec   BRAIN MRI There is a small, acute infarct in the region of the posterior left corona radiata/posterior basal ganglia. A chronic infarct is seen  in the left frontal operculum with associated chronic blood products. Chronic infarcts are also noted in the left frontal white matter and bilateral basal ganglia. There is  no evidence of mass, midline shift, or extra-axial fluid collection. There is mild cerebral atrophy. Mild chronic small vessel ischemic changes are noted in the deep cerebral white matter bilaterally.  Orbits are unremarkable. No significant inflammatory changes seen in the paranasal sinuses or mastoid air cells. The left sigmoid sinus and left jugular bulb flow voids are abnormal, which represents a change from the prior MRI. Major intracranial arterial vascular flow voids are preserved.    The brain MRI is reviewed in person. There is increased signal seen on diffusion images involving the subcortical  the L insular area extending to the lateral ventricle seen on a few cuts. Interestingly, reduced signal is not seen on the corresponding ADC scans. There are multiple areas of encephalomalacia including large right basal ganglia signal involving the putaminal area, head of the caudate nuclei on the right, left anterior frontal encephalomalacia involving the subcortical region extending to the juxtacortical region and left posterior frontal cortical subcortical encephalomalacia and left frontal deep matter adjacent to the lateral ventricle. T2 imaging shows abnormal flow void/increased signal involving left sigmoid vessel. Corresponding MRV shows absence of flow void involving the left internal jugular vessel, left sigmoid vessel and reduced flow involving the transverse sinus.  Anntionette Madkins A. Merlene Laughter, M.D.  Diplomate, Tax adviser of Psychiatry and Neurology ( Neurology). 07/15/2014, 8:33 AM

## 2014-07-15 NOTE — Evaluation (Signed)
Physical Therapy Evaluation Patient Details Name: [name] [name] MRN: [id number] DOB: 1940-07-19 Today's Date: 07/15/2014   History of Present Illness  This patient presented to the emergency room yesterday with complaints of right leg weakness as well as right arm numbness. He reported that his weakness is such that he is unable to bear any weight on his right leg. Imaging indicated a small left basal ganglia infarct as well as appearance of left sigmoid sinus and left jugular bulb thrombus. He denies any recent head trauma, sinus infection, history of malignancy, clotting disorder. He is currently taking aspirin and Plavix. Patient reports no falls, independent previously with all ADL's and IADL's. Patient has a cane and grab bars in shower.   Clinical Impression  Patient displays modified independence with bed mobility and transfers requiring 1 UE assistance to grab to bed rails to perform supine to sit and sit to stand for safety. Patient amble to ambulate 155ft with walker without therapist assistance, just CGA given for safety due to patient displaying Rt LE weakness and staggering gait on Rt with patient notably fearful of falling. Recommend patient DC home with home health PT for increasing ambulation tolerance and balance to decrease risk of falls. Patient will also benefit from out patient OT for increasing patient's Rt UE function as patient currently displays Rt decreased grip strength and propensity for dropping objects while trying to brush teeth. Recommending a front wheeled walker upon patient hospital DC.      Follow Up Recommendations Home health PT    Equipment Recommendations  Rolling walker with 5" wheels    Recommendations for Other Services       Precautions / Restrictions Precautions Precautions: Fall Precaution Comments: Rt sided weakness resulting in decreased balance  Restrictions Weight Bearing Restrictions: No      Mobility  Bed Mobility Overal bed mobility:  Modified Independent             General bed mobility comments: requires grab bars to pull,   Transfers Overall transfer level: Modified independent Equipment used: 1 person hand held assist             General transfer comment: Therapist provided one hand assist for safety only, patient did not require therpist assitance for performance of sit to stand.   Ambulation/Gait Ambulation/Gait assistance: Supervision Ambulation Distance (Feet): 100 Feet Assistive device: Rolling walker (2 wheeled) Gait Pattern/deviations: Decreased step length - right;Decreased stance time - right;Staggering right   Gait velocity interpretation: <1.8 ft/sec, indicative of risk for recurrent falls General Gait Details: Rt LE buckling secondary to weakness,   Stairs            Wheelchair Mobility    Modified Rankin (Stroke Patients Only)       Balance Overall balance assessment: Needs assistance Sitting-balance support: No upper extremity supported Sitting balance-Leahy Scale: Normal       Standing balance-Leahy Scale: Fair Standing balance comment: Requires supervision/CGA for safety during forward and lateral reaching.                              Pertinent Vitals/Pain Pain Assessment: No/denies pain    Home Living Family/patient expects to be discharged to:: Private residence Living Arrangements: Spouse/significant other Available Help at Discharge: Family Type of Home: House Home Access: Stairs to enter Entrance Stairs-Rails: Can reach both Entrance Stairs-Number of Steps: 1 Home Layout: Two level Home Equipment: Cane - single point  Prior Function Level of Independence: Independent               Hand Dominance   Dominant Hand: Right    Extremity/Trunk Assessment   Upper Extremity Assessment: RUE deficits/detail RUE Deficits / Details: numbness and tingling in Rt UE, gross grip strength minor weakness 4/5, fine grip strength 4-/5,  difficulty holding objects attributed to numbnees and decreased sensation   RUE Sensation: decreased light touch;decreased proprioception     Lower Extremity Assessment: RLE deficits/detail RLE Deficits / Details: Gross Rt LE strength 4/5 resulting in buckling of Rt LE durign gait and difficutly walking withotu assistance.     Cervical / Trunk Assessment: Normal  Communication   Communication: No difficulties  Cognition Arousal/Alertness: Awake/alert Behavior During Therapy: WFL for tasks assessed/performed Overall Cognitive Status: Within Functional Limits for tasks assessed                      General Comments      Exercises        Assessment/Plan    PT Assessment Patient needs continued PT services  PT Diagnosis Difficulty walking   PT Problem List Decreased strength;Decreased activity tolerance;Decreased balance  PT Treatment Interventions Balance training;Gait training;Stair training;Functional mobility training;Therapeutic exercise;Patient/family education   PT Goals (Current goals can be found in the Care Plan section) Acute Rehab PT Goals Patient Stated Goal: Patient will be able to return home PT Goal Formulation: With patient Time For Goal Achievement: 07/18/14 Potential to Achieve Goals: Good    Frequency Min 6X/week   Barriers to discharge        Co-evaluation               End of Session   Activity Tolerance: Patient tolerated treatment well Patient left: in bed;with call bell/phone within reach;with family/visitor present;with nursing/sitter in room Nurse Communication: Mobility status    Functional Assessment Tool Used: Gait distance Functional Limitation: Mobility: Walking and moving around Mobility: Walking and Moving Around Current Status (N8295): At least 60 percent but less than 80 percent impaired, limited or restricted Mobility: Walking and Moving Around Goal Status [phone]): At least 40 percent but less than 60 percent  impaired, limited or restricted    Time: 0920-0935 PT Time Calculation (min) (ACUTE ONLY): 15 min   Charges:   PT Evaluation $Initial PT Evaluation Tier I: 1 Procedure     PT G Codes:   PT G-Codes **NOT FOR INPATIENT CLASS** Functional Assessment Tool Used: Gait distance Functional Limitation: Mobility: Walking and moving around Mobility: Walking and Moving Around Current Status (Q6578): At least 60 percent but less than 80 percent impaired, limited or restricted Mobility: Walking and Moving Around Goal Status [phone]): At least 40 percent but less than 60 percent impaired, limited or restricted    Marleah Beever R 07/15/2014, 9:52 AM

## 2014-07-15 NOTE — Progress Notes (Signed)
Patient states understanding of discharge instructions, prescription given. Strokebook given.

## 2014-07-15 NOTE — Discharge Summary (Signed)
Physician Discharge Summary  [name] [name] MRN: [id number] DOB/AGE: October 21, 1940 74 y.o.  PCP: Delphina Cahill, MD   Admit date: 07/14/2014 Discharge date: 07/15/2014  Discharge Diagnoses:   Principal Problem:   CVA (cerebral infarction) Active Problems:   Essential hypertension, benign   CORONARY ATHEROSCLEROSIS NATIVE CORONARY ARTERY   Right sided weakness   Cerebral venous thrombosis of sigmoid sinus   Follow-up recommendations Follow-up with PCP in 3-5 days Signed follow-up with cardiology in 2-3 days for setup for the Holter monitor Follow-up with neurology in 3-4 weeks PCP to follow-up on the results of the 2-D echo    Medication List    STOP taking these medications        clopidogrel 75 MG tablet  Commonly known as:  PLAVIX      TAKE these medications        aspirin EC 81 MG tablet  Take 81 mg by mouth daily.     metoprolol 50 MG tablet  Commonly known as:  LOPRESSOR  - Take 25 mg by mouth 2 (two) times daily. Take 1/2 tab bid  - '     nitroGLYCERIN 0.4 MG SL tablet  Commonly known as:  NITROSTAT  Place 1 tablet (0.4 mg total) under the tongue every 5 (five) minutes as needed.     polycarbophil 625 MG tablet  Commonly known as:  FIBERCON  Take 1,250 mg by mouth daily.     Rivaroxaban 15 & 20 MG Tbpk  Commonly known as:  XARELTO STARTER PACK  Take as directed on package: Start with one 85m tablet by mouth twice a day with food. On Day 22, switch to one 292mtablet once a day with food.     Rivaroxaban 15 MG Tabs tablet  Commonly known as:  XARELTO  Take 1 tablet (15 mg total) by mouth 2 (two) times daily with a meal.     rivaroxaban 20 MG Tabs tablet  Commonly known as:  XARELTO  Take 1 tablet (20 mg total) by mouth daily with supper.  Start taking on:  08/06/2014     senna-docusate 8.6-50 MG per tablet  Commonly known as:  Senokot-S  Take 1 tablet by mouth at bedtime as needed for mild constipation.     simvastatin 40 MG tablet  Commonly known  as:  ZOCOR  Take 40 mg by mouth daily.     SYSTANE OP  Apply 1 drop to eye daily as needed (Dry Eyes).     telmisartan 80 MG tablet  Commonly known as:  MICARDIS  Take 80 mg by mouth daily.         Discharge Condition: Stable    Disposition: 01-Home or Self Care   Consults: Neurology   Significant Diagnostic Studies:  Dg Chest 2 View  07/14/2014   CLINICAL DATA:  [age]ear old male unable to move his right lower extremity. Cerebral infarct. Initial encounter.  EXAM: CHEST  2 VIEW  COMPARISON:  08/27/2013 and earlier.  FINDINGS: Lung volumes are within normal limits. Normal cardiac size and mediastinal contours. Visualized tracheal air column is within normal limits. No pneumothorax, pulmonary edema, pleural effusion or confluent pulmonary opacity. Degenerative changes in the spine. No acute osseous abnormality identified.  IMPRESSION: No acute cardiopulmonary abnormality.   Electronically Signed   By: H Genevie Ann.D.   On: 07/14/2014 16:40   Ct Head Wo Contrast  07/14/2014   CLINICAL DATA:  Right arm and leg weakness starting this morning  EXAM: CT HEAD WITHOUT  CONTRAST  TECHNIQUE: Contiguous axial images were obtained from the base of the skull through the vertex without intravenous contrast.  COMPARISON:  02/19/2011, history of CVA  FINDINGS: No skull fracture is noted  No intracranial hemorrhage, mass effect or midline shift. Again noted small lacunar infarct in left caudate nucleus. Stable lacunar infarct in right basal ganglia. There is interval lacunar infarct in left frontal lobe measures about 9 mm. There is area of decreased attenuation at the junction of left frontal with left parietal lobe adjacent to sylvian fissure axial image 19. Evolving ischemia cannot be excluded. Clinical correlation is necessary. Further correlation with MRI is recommended as clinically warranted. New small lacunar infarct in right caudate nucleus. No mass lesion is noted on this unenhanced scan.   IMPRESSION: No intracranial hemorrhage, mass effect or midline shift. Again noted small lacunar infarct in left caudate nucleus. Stable lacunar infarct in right basal ganglia. There is interval lacunar infarct in left frontal lobe measures about 9 mm. There is small area of decreased attenuation at the junction of left frontal with left parietal lobe adjacent to sylvian fissure axial image 19. Evolving ischemia cannot be excluded. Clinical correlation is necessary. Further correlation with MRI is recommended as clinically warranted.  These results were called by telephone at the time of interpretation on 07/14/2014 at 10:03 am to Dr. Quintella Reichert , who verbally acknowledged these results.   Electronically Signed   By: Lahoma Crocker M.D.   On: 07/14/2014 10:03   Mr Jodene Nam Head Wo Contrast  07/14/2014   CLINICAL DATA:  Right-sided extremity weakness. Abnormal appearance of the left sigmoid sinus on MRI.  EXAM: MRA HEAD WITHOUT CONTRAST  TECHNIQUE: Angiographic images of the Circle of Willis were obtained using MRA technique without intravenous contrast.  COMPARISON:  Brain MRI earlier today and from 02/20/2011  FINDINGS: The superior sagittal sinus, straight sinus, right transverse sinus, right sigmoid sinus, and right jugular bulb appear patent. Vein of [place] appears patent. The left transverse sinus is mildly hypoplastic. 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 There is no flow related enhancement in the lateral most aspect of the left transverse sinus and no flow is identified in the left sigmoid sinus or left jugular bulb.  IMPRESSION: Thrombosis of the left sigmoid sinus, left jugular bulb, and lateral left transverse sinus.  These results were called by telephone at the time of interpretation on 07/14/2014 at 3:49 pm to Dyanne Carrel, NP, who verbally acknowledged these results.    Electronically Signed   By: Logan Bores   On: 07/14/2014 15:49   Mr Brain Wo Contrast (neuro Protocol)  07/14/2014   CLINICAL DATA:  Right-sided extremity weakness.  History of stroke.  EXAM: MRI HEAD WITHOUT CONTRAST  TECHNIQUE: Multiplanar, multiecho pulse sequences of the brain and surrounding structures were obtained without intravenous contrast.  COMPARISON:  Head CT 07/14/2014 and MRI 02/20/2011  FINDINGS: There is a small, acute infarct in the region of the posterior left corona radiata/posterior basal ganglia. A chronic infarct is seen in the left frontal operculum with associated chronic blood products. Chronic infarcts are also noted in the left frontal white matter and bilateral basal ganglia. There is no evidence of mass, midline shift, or extra-axial fluid collection. There is mild cerebral atrophy. Mild chronic small vessel ischemic changes are noted in the deep cerebral white matter bilaterally.  Orbits are unremarkable. No  significant inflammatory changes seen in the paranasal sinuses or mastoid air cells. The left sigmoid sinus and left jugular bulb flow voids are abnormal, which represents a change from the prior MRI. Major intracranial arterial vascular flow voids are preserved.  IMPRESSION: 1. Small, acute posterior left basal ganglia/ deep white matter infarct. 2. Chronic infarcts in the left frontal lobe and bilateral basal ganglia. 3. New abnormal appearance of the left sigmoid sinus and left jugular bulb, suspicious for slow flow or occlusion due to thrombus and of uncertain acuity. MRV or CTV is suggested for further evaluation.   Electronically Signed   By: Logan Bores   On: 07/14/2014 11:14   US Carotid Bilateral  07/14/2014   CLINICAL DATA:  Stroke. Coronary disease. Hypertension, previous tobacco abuse.  EXAM: BILATERAL CAROTID DUPLEX ULTRASOUND  TECHNIQUE: Pearline Cables scale imaging, color Doppler and duplex ultrasound was performed of bilateral carotid and vertebral arteries in the  neck.  COMPARISON:  02/20/2011  REVIEW OF SYSTEMS: Quantification of carotid stenosis is based on velocity parameters that correlate the residual internal carotid diameter with NASCET-based stenosis levels, using the diameter of the distal internal carotid lumen as the denominator for stenosis measurement.  The following velocity measurements were obtained:  PEAK SYSTOLIC/END DIASTOLIC  RIGHT  ICA:                     87/17cm/sec  CCA:                     161/09UE/AVW  SYSTOLIC ICA/CCA RATIO:  0.7  DIASTOLIC ICA/CCA RATIO: 1.0  ECA:                     159cm/sec  LEFT  ICA:                     181/38cm/sec  CCA:                     154 /09WJ/XBJ  SYSTOLIC ICA/CCA RATIO:  1.2  DIASTOLIC ICA/CCA RATIO: 1.2  ECA:                     202cm/sec  FINDINGS: RIGHT CAROTID ARTERY: Eccentric partially calcified plaque in the carotid bulb and proximal ICA, without high-grade stenosis. Normal waveforms and color Doppler signal.  RIGHT VERTEBRAL ARTERY:  Normal flow direction and waveform.  LEFT CAROTID ARTERY: Circumferential partially calcified plaque in the carotid bulb extending into the proximal ICA without high-grade stenosis on grayscale. Normal waveforms and color Doppler signal.  LEFT VERTEBRAL ARTERY: Normal flow direction and waveform.  IMPRESSION: 1. Bilateral carotid bifurcation and proximal ICA plaque, resulting in less than 50% diameter stenosis. The exam does not exclude plaque ulceration or embolization. Continued surveillance recommended.   Electronically Signed   By: Lucrezia Europe M.D.   On: 07/14/2014 16:48    2-D echo pending Filed Weights   07/14/14 0905 07/14/14 1224  Weight: 82.555 kg (182 lb) 82.963 kg (182 lb 14.4 oz)     Microbiology: No results found for this or any previous visit (from the past 240 hour(s)).     Blood Culture No results found for: SDES, [place], CULT, REPTSTATUS    Labs: Results for orders placed or performed during the hospital encounter of 07/14/14 (from the  past 48 hour(s))  CBG monitoring, ED     Status: Abnormal   Collection Time: 07/14/14  9:06 AM  Result Value Ref  Range   Glucose-Capillary 106 (H) 65 - 99 mg/dL  Protime-INR     Status: None   Collection Time: 07/14/14  9:10 AM  Result Value Ref Range   Prothrombin Time 14.7 11.6 - 15.2 seconds   INR 1.13 0.00 - 1.49  APTT     Status: None   Collection Time: 07/14/14  9:10 AM  Result Value Ref Range   aPTT 27 24 - 37 seconds  CBC     Status: None   Collection Time: 07/14/14  9:10 AM  Result Value Ref Range   WBC 7.7 4.0 - 10.5 K/uL   RBC 4.71 4.22 - 5.81 MIL/uL   Hemoglobin 14.4 13.0 - 17.0 g/dL   HCT 41.5 39.0 - 52.0 %   MCV 88.1 78.0 - 100.0 fL   MCH 30.6 26.0 - 34.0 pg   MCHC 34.7 30.0 - 36.0 g/dL   RDW 12.7 11.5 - 15.5 %   Platelets 213 150 - 400 K/uL  Differential     Status: None   Collection Time: 07/14/14  9:10 AM  Result Value Ref Range   Neutrophils Relative % 49 43 - 77 %   Neutro Abs 3.8 1.7 - 7.7 K/uL   Lymphocytes Relative 40 12 - 46 %   Lymphs Abs 3.1 0.7 - 4.0 K/uL   Monocytes Relative 8 3 - 12 %   Monocytes Absolute 0.6 0.1 - 1.0 K/uL   Eosinophils Relative 3 0 - 5 %   Eosinophils Absolute 0.2 0.0 - 0.7 K/uL   Basophils Relative 0 0 - 1 %   Basophils Absolute 0.0 0.0 - 0.1 K/uL  Comprehensive metabolic panel     Status: Abnormal   Collection Time: 07/14/14  9:10 AM  Result Value Ref Range   Sodium 140 135 - 145 mmol/L   Potassium 3.8 3.5 - 5.1 mmol/L   Chloride 105 101 - 111 mmol/L   CO2 27 22 - 32 mmol/L   Glucose, Bld 119 (H) 65 - 99 mg/dL   BUN 16 6 - 20 mg/dL   Creatinine, Ser 0.84 0.61 - 1.24 mg/dL   Calcium 8.8 (L) 8.9 - 10.3 mg/dL   Total Protein 7.4 6.5 - 8.1 g/dL   Albumin 4.4 3.5 - 5.0 g/dL   AST 18 15 - 41 U/L   ALT 16 (L) 17 - 63 U/L   Alkaline Phosphatase 80 38 - 126 U/L   Total Bilirubin 1.3 (H) 0.3 - 1.2 mg/dL   GFR calc non Af Amer >60 >60 mL/min   GFR calc Af Amer >60 >60 mL/min    Comment: (NOTE) The eGFR has been  calculated using the CKD EPI equation. This calculation has not been validated in all clinical situations. eGFR's persistently <60 mL/min signify possible Chronic Kidney Disease.    Anion gap 8 5 - 15  Hemoglobin A1c     Status: Abnormal   Collection Time: 07/14/14  9:10 AM  Result Value Ref Range   Hgb A1c MFr Bld 6.0 (H) 4.8 - 5.6 %    Comment: (NOTE)         Pre-diabetes: 5.7 - 6.4         Diabetes: >6.4         Glycemic control for adults with diabetes: <7.0    Mean Plasma Glucose 126 mg/dL    Comment: (NOTE) Performed At: [facility] [address] [place], [place] [id number] Lindon Romp MD YB:[id number]   Lipid panel  Status: Abnormal   Collection Time: 07/14/14  9:10 AM  Result Value Ref Range   Cholesterol 158 0 - 200 mg/dL   Triglycerides 136 <150 mg/dL   HDL 36 (L) >40 mg/dL   Total CHOL/HDL Ratio 4.4 RATIO   VLDL 27 0 - 40 mg/dL   LDL Cholesterol 95 0 - 99 mg/dL    Comment:        Total Cholesterol/HDL:CHD Risk Coronary Heart Disease Risk Table                     Men   Women  1/2 Average Risk   3.4   3.3  Average Risk       5.0   4.4  2 X Average Risk   9.6   7.1  3 X Average Risk  23.4   11.0        Use the calculated Patient Ratio above and the CHD Risk Table to determine the patient's CHD Risk.        ATP III CLASSIFICATION (LDL):  <100     mg/dL   Optimal  100-129  mg/dL   Near or Above                    Optimal  130-159  mg/dL   Borderline  160-189  mg/dL   High  >190     mg/dL   Very High   I-stat troponin, ED (not at [facility], [facility])     Status: None   Collection Time: 07/14/14  9:20 AM  Result Value Ref Range   Troponin i, poc 0.00 0.00 - 0.08 ng/mL   Comment 3            Comment: Due to the release kinetics of cTnI, a negative result within the first hours of the onset of symptoms does not rule out myocardial infarction with certainty. If myocardial infarction is still suspected, repeat the test at appropriate intervals.    Urine Drug Screen     Status: None   Collection Time: 07/14/14 11:14 AM  Result Value Ref Range   Opiates NONE DETECTED NONE DETECTED   Cocaine NONE DETECTED NONE DETECTED   Benzodiazepines NONE DETECTED NONE DETECTED   Amphetamines NONE DETECTED NONE DETECTED   Tetrahydrocannabinol NONE DETECTED NONE DETECTED   Barbiturates NONE DETECTED NONE DETECTED    Comment:        DRUG SCREEN FOR MEDICAL PURPOSES ONLY.  IF CONFIRMATION IS NEEDED FOR ANY PURPOSE, NOTIFY LAB WITHIN 5 DAYS.        LOWEST DETECTABLE LIMITS FOR URINE DRUG SCREEN Drug Class       Cutoff (ng/mL) Amphetamine      1000 Barbiturate      200 Benzodiazepine   671 Tricyclics       245 Opiates          300 Cocaine          300 THC              50   Urinalysis, Routine w reflex microscopic     Status: None   Collection Time: 07/14/14 11:14 AM  Result Value Ref Range   Color, Urine YELLOW YELLOW   APPearance CLEAR CLEAR   Specific Gravity, Urine 1.020 1.005 - 1.030   pH 6.0 5.0 - 8.0   Glucose, UA NEGATIVE NEGATIVE mg/dL   Hgb urine dipstick NEGATIVE NEGATIVE   Bilirubin Urine NEGATIVE NEGATIVE   Ketones, ur NEGATIVE NEGATIVE  mg/dL   Protein, ur NEGATIVE NEGATIVE mg/dL   Urobilinogen, UA 0.2 0.0 - 1.0 mg/dL   Nitrite NEGATIVE NEGATIVE   Leukocytes, UA NEGATIVE NEGATIVE    Comment: MICROSCOPIC NOT DONE ON URINES WITH NEGATIVE PROTEIN, BLOOD, LEUKOCYTES, NITRITE, OR GLUCOSE <1000 mg/dL.  Troponin I     Status: None   Collection Time: 07/14/14  5:55 PM  Result Value Ref Range   Troponin I <0.03 <0.031 ng/mL    Comment:        NO INDICATION OF MYOCARDIAL INJURY.      Lipid Panel     Component Value Date/Time   CHOL 158 07/14/2014 0910   TRIG 136 07/14/2014 0910   HDL 36* 07/14/2014 0910   CHOLHDL 4.4 07/14/2014 0910   VLDL 27 07/14/2014 0910   LDLCALC 95 07/14/2014 0910     Lab Results  Component Value Date   HGBA1C 6.0* 07/14/2014   HGBA1C 5.6 02/20/2011     Lab Results  Component Value  Date   LDLCALC 95 07/14/2014   CREATININE 0.84 07/14/2014     HPI :*[name] [name] is a very pleasant 74 y.o. male with a past medical history of left brain stroke 2012 presents to the emergency department with chief complaint of right-sided weakness. Initial evaluation in the emergency department includes a CT of the head revealing new small lacunar infarct in right caudate nucleus. No mass lesion is noted on this unenhanced scan and concern for evolving ischemia. Patient reports he was in his usual state of health last night when he went to bed around 9:00 when he awakened this morning he experienced weakness in his right leg. Reports unable to bear weight and "had to drag it along". Associated symptoms include numbness and tingling of his right hand up to mid forearm. He denies right arm weakness. He denies headache visual disturbances dizziness syncope or near-syncope. He denies any difficulty chewing swallowing or speaking. He denies chest pain palpitation shortness of breath. He denies abdominal pain nausea vomiting diarrhea constipation melena. He denies dysuria hematuria frequency or urgency. There has been no fever chills or recent sick contacts.  Workup in the emergency department includes complete blood count is unremarkable basic metabolic panel significant for calcium of 8.8 and serum glucose of 119.  In the emergency department he is hemodynamically stable afebrile and not hypoxic. Portably he passed a bedside swallow eval   HOSPITAL COURSE:  CVA (cerebral infarction): /Sinus thrombosis CT of the head with new lacunar infarct, MRI of the brain small, acute posterior left basal ganglia/ deep white matter infarct. Chronic infarcts in the left frontal lobe and bilateral basal ganglia. New abnormal appearance of the left sigmoid sinus and left jugular bulb, suspicious for slow flow or occlusion due to thrombus and of uncertain acuity. Telemetry uneventful, shows normal sinus  rhythm Patient is already on aspirin and Plavix, discussed with neurology who has seen the patient Patient will be started on Xarelto for sinus thrombosis. 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 Medically managed per chart review. Her review indicates Myoview in  2012 showed moderate scarring with no residual ischemia and LVEF of 59%.    Discharge Exam: Blood pressure 137/54, pulse 48, temperature 98.2 F (36.8 C), temperature source Oral, resp. rate 20, height 5' 9"  (1.753 m), weight 82.963 kg (182 lb 14.4 oz), SpO2 95 %.   Constitutional: He is oriented to person, place, and time. He appears well-developed and well-nourished.  HENT:  Head: Normocephalic and atraumatic.  Eyes: EOM are normal. Pupils are equal, round, and reactive to light.  Cardiovascular: Normal rate and regular rhythm.  No murmur heard. Pulmonary/Chest: Effort normal and breath sounds normal. No respiratory distress.  Abdominal: Soft. There is no tenderness. There is no rebound and no guarding.  Musculoskeletal: He exhibits no edema or tenderness.  Neurological: He is alert and oriented to person, place, and time. No cranial nerve deficit.  Mild RLE weakness, decreased sensation to light touch over RUE  Skin: Skin is warm and dry.  Psychiatric: He has a normal mood and affect. His behavior is normal.       Discharge Instructions    Diet - low sodium heart healthy    Complete by:  As directed      Increase activity slowly    Complete by:  As directed            Follow-up  Information    Follow up with [facility], [place], MD. Schedule an appointment as soon as possible for a visit in 4 weeks.   Specialty:  Neurology   Contact information:   [address] [place] [address] [phone]       Follow up with Delphina Cahill, MD. Schedule an appointment as soon as possible for a visit in 1 week.   Specialty:  Internal Medicine   Contact information:    [place] [address] [phone]       Follow up with Rozann Lesches, MD. Schedule an appointment as soon as possible for a visit in 2 days.   Specialty:  Cardiology   Why:  Patient needs a Holter monitor, recurrent strokes   Contact information:   [place] [address] [phone]       Signed: Reyne Dumas 07/15/2014, 9:35 AM        Time spent >45 mins

## 2014-07-16 LAB — HOMOCYSTEINE: Homocysteine: 9.1 umol/L (ref 0.0–15.0)

## 2014-07-18 LAB — PROTEIN S ACTIVITY: Protein S Activity: 77 % (ref 60–145)

## 2014-07-18 LAB — PROTEIN C ACTIVITY: Protein C Activity: 100 % (ref 74–151)

## 2014-07-18 LAB — LUPUS ANTICOAGULANT PANEL
DRVVT: 37.7 s (ref 0.0–55.1)
PTT Lupus Anticoagulant: 37.6 s (ref 0.0–50.0)

## 2014-07-18 LAB — PROTEIN S, TOTAL: Protein S Ag, Total: 106 % (ref 58–150)

## 2014-07-18 LAB — PROTEIN C, TOTAL: Protein C, Total: 88 % (ref 70–140)

## 2014-07-19 LAB — FACTOR 5 LEIDEN

## 2014-07-20 ENCOUNTER — Ambulatory Visit (INDEPENDENT_AMBULATORY_CARE_PROVIDER_SITE_OTHER): Payer: PPO

## 2014-07-20 ENCOUNTER — Ambulatory Visit (INDEPENDENT_AMBULATORY_CARE_PROVIDER_SITE_OTHER): Payer: PPO | Admitting: Cardiology

## 2014-07-20 ENCOUNTER — Encounter: Payer: Self-pay | Admitting: Cardiology

## 2014-07-20 VITALS — BP 130/68 | HR 56 | Wt 187.0 lb

## 2014-07-20 DIAGNOSIS — I10 Essential (primary) hypertension: Secondary | ICD-10-CM | POA: Diagnosis not present

## 2014-07-20 DIAGNOSIS — I639 Cerebral infarction, unspecified: Secondary | ICD-10-CM

## 2014-07-20 DIAGNOSIS — I498 Other specified cardiac arrhythmias: Secondary | ICD-10-CM

## 2014-07-20 DIAGNOSIS — I2510 Atherosclerotic heart disease of native coronary artery without angina pectoris: Secondary | ICD-10-CM | POA: Diagnosis not present

## 2014-07-20 LAB — PROTHROMBIN GENE MUTATION

## 2014-07-20 NOTE — Patient Instructions (Addendum)
Your physician recommends that you schedule a follow-up appointment in: 1 month with Dr Domenic Polite   Your physician recommends that you continue on your current medications as directed. Please refer to the Current Medication list given to you today.   STOP Aspirin    Your physician has recommended that you wear an event monitor for 30 days. Event monitors are medical devices that record the heart's electrical activity. Doctors most often [place] these monitors to diagnose arrhythmias. Arrhythmias are problems with the speed or rhythm of the heartbeat. The monitor is a small, portable device. You can wear one while you do your normal daily activities. This is usually used to diagnose what is causing palpitations/syncope (passing out).      Thank you for choosing [facility] !

## 2014-07-20 NOTE — Progress Notes (Signed)
Cardiology Office Note  Date: 07/20/2014   ID: HENRY UTSEY, DOB 03/07/1940, MRN [id number]  PCP: Delphina Cahill, MD  Primary Cardiologist: Rozann Lesches, MD   Chief Complaint  Patient presents with  . Hospitalization Follow-up    History of Present Illness: [name] [name] is a 74 y.o. male last seen in August 2015. 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 He was evaluated by Dr. Merlene Laughter with neurology and treated with Xarelto (planned 6 month course) and taken off Plavix. Echocardiogram and carotid studies are noted below. It has been requested that he also obtain an event recorder.  He is here today with his wife, states that he is gradually getting better, gaining strength on his right side as well as decreased paresthesias in his right hand. He is using a cane. We reviewed his medications which are outlined below. He has had some nosebleeds. We have discussed stopping aspirin for now since he will be on Xarelto at least for the next 6 months.  We discussed the rationale for further cardiac monitoring to exclude any atrial arrhythmias that might increase his risk of thromboembolic events and stroke, thereby necessitating longer term anticoagulation. He does not endorse any palpitations or chest pains. I reviewed his ECG below and the available telemetry strips from hospital stay. He had sinus rhythm with PACs but no atrial fibrillation or flutter.   Past Medical History  Diagnosis Date  . Coronary atherosclerosis of native coronary artery     PTCA/stent to LAD and diagonal 2001, LVEF 55%  . Hyperlipidemia   . Essential hypertension, benign   . Drug intolerance     ACE inhibitor; related to cough   . MI (myocardial infarction)   . CVA (cerebral vascular accident) 01/2011    Resolved  dysarthria and right hemiparesis  . Partial small bowel obstruction 12//2012    Resolved spontaneously.    Past Surgical History  Procedure Laterality Date  . Polypectomy  2005    Current Outpatient Prescriptions  Medication Sig Dispense Refill  . metoprolol (LOPRESSOR) 50 MG tablet Take 25 mg by mouth 2 (two) times daily. Take 1/2 tab bid '    . nitroGLYCERIN (NITROSTAT) 0.4 MG SL tablet Place 1 tablet (0.4 mg total) under the tongue every 5 (five) minutes as needed. 25 tablet 1  . polycarbophil (FIBERCON) 625 MG tablet Take 1,250 mg by mouth daily.      [name] Glycol-Propyl Glycol (SYSTANE OP) Apply 1 drop to eye daily as needed (Dry Eyes).    . Rivaroxaban (XARELTO STARTER PACK) 15 & 20 MG TBPK Take as directed on package: Start with one 15mg  tablet by mouth twice a day with food. On Day 22, switch to one 20mg  tablet once a day with food. 51 each 0  . Rivaroxaban (XARELTO) 15 MG TABS tablet Take 1 tablet (15 mg total) by mouth 2 (two) times daily with a meal. 42 tablet 0  . [START ON 08/06/2014] rivaroxaban (XARELTO) 20 MG TABS tablet Take 1 tablet (20 mg total) by mouth daily with supper. 30 tablet 10  . senna-docusate (SENOKOT-S) 8.6-50 MG per tablet Take 1 tablet by mouth at bedtime as needed for mild constipation. 30 tablet 0  . simvastatin (ZOCOR) 40 MG tablet Take 40 mg by mouth daily.    Marland Kitchen telmisartan (MICARDIS) 80 MG tablet  Take 80 mg by mouth daily.     No current facility-administered medications for this visit.    Allergies:  Penicillins   Social History: The patient  reports that he quit smoking about 55 years ago. His smoking use included Cigarettes. He has never used smokeless tobacco. He reports that he does not drink alcohol or use illicit drugs.   ROS:  Please see the history of present illness. Otherwise, complete review of systems is positive for none.  All other systems are reviewed and negative.   Physical Exam: VS:  BP 130/68 mmHg  Pulse 56  Wt 187 lb  (84.823 kg)  SpO2 97%, BMI Body mass index is 27.6 kg/(m^2).  Wt Readings from Last 3 Encounters:  07/20/14 187 lb (84.823 kg)  07/14/14 182 lb 14.4 oz (82.963 kg)  07/14/14 182 lb (82.555 kg)     Appears comfortable at rest. Uses a cane to walk. HEENT: Conjunctiva and lids normal, oropharynx clear.  Neck: Supple, no elevated JVP or carotid bruits, no thyromegaly.  Lungs: Clear to auscultation, nonlabored breathing at rest.  Cardiac: Regular rate and rhythm, no S3 or significant systolic murmur, no pericardial rub.  Abdomen: Soft, nontender, bowel sounds present, no guarding or rebound.  Extremities: No pitting edema, distal pulses 2+. Skin: Warm and dry. Musculoskeletal: No kyphosis. Neuropsychiatric: Alert and oriented 3, mild right sided leg and arm weakness.   ECG: Tracing from 07/14/2014 showed sinus rhythm with low voltage and possible old anterior infarct pattern.  Recent Labwork: 07/14/2014: ALT 16*; AST 18; BUN 16; Creatinine 0.84; Hemoglobin 14.4; Platelets 213; Potassium 3.8; Sodium 140     Component Value Date/Time   CHOL 158 07/14/2014 0910   TRIG 136 07/14/2014 0910   HDL 36* 07/14/2014 0910   CHOLHDL 4.4 07/14/2014 0910   VLDL 27 07/14/2014 0910   LDLCALC 95 07/14/2014 0910    Other Studies Reviewed Today:  Echocardiogram 07/15/2014: Study Conclusions  - Procedure narrative: Transthoracic echocardiography. Image quality was adequate. The study was technically difficult, as a result of poor acoustic windows. - Left ventricle: The cavity size was normal. There was mild concentric hypertrophy. Systolic function was normal. The estimated ejection fraction was in the range of 60% to 65%. Wall motion was normal; there were no regional wall motion abnormalities. Doppler parameters are consistent with abnormal left ventricular relaxation (grade 1 diastolic dysfunction). - Aortic valve: Mildly calcified annulus. Trileaflet. - Tricuspid valve:  There was mild regurgitation. - Pulmonic valve: There was mild regurgitation.  Carotid Dopplers 07/14/2014: IMPRESSION: 1. Bilateral carotid bifurcation and proximal ICA plaque, resulting in less than 50% diameter stenosis. The exam does not exclude plaque ulceration or embolization. Continued surveillance recommended.   ASSESSMENT AND PLAN:  1. Recent stroke as outlined above. No obvious atrial arrhythmias noted during hospital stay, echocardiogram showed normal LVEF and no major valvular abnormalities, carotid Dopplers did not demonstrate obstructive ICA stenoses. We will obtain a 30 day event recorder for further heart rhythm surveillance. For now he continues on Xarelto (stopping aspirin) with concurrent diagnosis of left cerebral venous thrombosis. He will continue to follow with Dr. Merlene Laughter.  2. Symptomatically stable CAD.  3. Essential hypertension, blood pressure reasonably well controlled.  4. 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 He is on beta blocker. Not clear that this is symptomatic, however we will continue to observe, cardiac monitor may also be useful in this regard as well.  Current medicines were reviewed at length with the patient today.   Orders  Placed This Encounter  Procedures  . Cardiac event monitor    Disposition: FU with me in 1 month.   Signed, Satira Sark, MD, [facility] 07/20/2014 1:58 PM    [place] at [facility] [address], [place], [place] [address] Phone: [phone]; Fax: [phone]

## 2014-07-21 LAB — CARDIOLIPIN ANTIBODIES, IGG, IGM, IGA
Anticardiolipin IgA: <9 APL U/mL (ref 0–11)
Anticardiolipin IgM: <9 MPL U/mL (ref 0–12)

## 2014-07-21 LAB — BETA-2-GLYCOPROTEIN I ABS, IGG/M/A
Beta-2-Glycoprotein I IgA: <9 GPI IgA units (ref 0–25)
Beta-2-Glycoprotein I IgM: <9 GPI IgM units (ref 0–32)

## 2014-08-31 ENCOUNTER — Encounter: Payer: Self-pay | Admitting: Cardiology

## 2014-08-31 ENCOUNTER — Ambulatory Visit (INDEPENDENT_AMBULATORY_CARE_PROVIDER_SITE_OTHER): Payer: PPO | Admitting: Cardiology

## 2014-08-31 VITALS — BP 122/64 | HR 51 | Ht 69.0 in | Wt 185.4 lb

## 2014-08-31 DIAGNOSIS — Z8673 Personal history of transient ischemic attack (TIA), and cerebral infarction without residual deficits: Secondary | ICD-10-CM | POA: Diagnosis not present

## 2014-08-31 DIAGNOSIS — I10 Essential (primary) hypertension: Secondary | ICD-10-CM | POA: Diagnosis not present

## 2014-08-31 DIAGNOSIS — I2510 Atherosclerotic heart disease of native coronary artery without angina pectoris: Secondary | ICD-10-CM

## 2014-08-31 NOTE — Progress Notes (Signed)
Cardiology Office Note  Date: 08/31/2014   ID: DEREC MOZINGO, DOB [birth year], MRN [id number]  PCP: Delphina Cahill, MD  Primary Cardiologist: Rozann Lesches, MD   Chief Complaint  Patient presents with  . Follow-up cardiac monitor  . Coronary Artery Disease  . History of stroke    History of Present Illness: [name] [name] is a 74 y.o. male last seen in May. Cardiac monitor was placed at that time to exclude any obvious atrial arrhythmias in light of his recent history of stroke.  He completed wearing the monitor, and available monitoring strips have only showed sinus bradycardia with frequent PACs, no atrial fibrillation.  I discussed this with the patient and his wife today.  He is followed by Dr. Merlene Laughter with a history of stroke in May (acute left subcortical infarct involving the insular cortex and extending into the ventricular region) and also incidentally noted left cerebral venous thrombosis for which he is being treated with Xarelto (planned 6 month course).  He tells that he has had slow improvement. Still feels fatigued, seems somewhat frustrated with his decreased functional capacity in general. He denies any angina symptoms, no palpitations.  Of note, metoprolol dose was cut back by Dr. Nevada Crane. Reasonable with his bradycardia. May be worth discontinuing altogether if he does not continue to improve.   Past Medical History  Diagnosis Date  . Coronary atherosclerosis of native coronary artery     PTCA/stent to LAD and diagonal 2001, LVEF 55%  . Hyperlipidemia   . Essential hypertension, benign   . Drug intolerance     ACE inhibitor; related to cough   . MI (myocardial infarction)   . CVA (cerebral vascular accident) 01/2011    Resolved dysarthria and right hemiparesis  . Partial small bowel obstruction 12//2012    Resolved spontaneously.    Current Outpatient Prescriptions  Medication Sig Dispense Refill  . metoprolol tartrate (LOPRESSOR) 25 MG tablet Take 12.5 mg  by mouth once.    . nitroGLYCERIN (NITROSTAT) 0.4 MG SL tablet Place 1 tablet (0.4 mg total) under the tongue every 5 (five) minutes as needed. 25 tablet 1  . polycarbophil (FIBERCON) 625 MG tablet Take 1,250 mg by mouth daily.      [name] Glycol-Propyl Glycol (SYSTANE OP) Apply 1 drop to eye daily as needed (Dry Eyes).    . rivaroxaban (XARELTO) 20 MG TABS tablet Take 1 tablet (20 mg total) by mouth daily with supper. 30 tablet 10  . senna-docusate (SENOKOT-S) 8.6-50 MG per tablet Take 1 tablet by mouth at bedtime as needed for mild constipation. 30 tablet 0  . simvastatin (ZOCOR) 40 MG tablet Take 40 mg by mouth daily.    Marland Kitchen telmisartan (MICARDIS) 80 MG tablet Take 80 mg by mouth daily.     No current facility-administered medications for this visit.    Allergies:  Penicillins   Social History: The patient  reports that he quit smoking about 55 years ago. His smoking use included Cigarettes. He has never used smokeless tobacco. He reports that he does not drink alcohol or use illicit drugs.   ROS:  Please see the history of present illness. Otherwise, complete review of systems is positive for  Fatigue, some unsteadiness. Uses a cane to walk..  All other systems are reviewed and negative.   Physical Exam: VS:  BP 122/64 mmHg  Pulse 51  Ht 5\' 9"  (1.753 m)  Wt 185 lb 6.4 oz (84.097 kg)  BMI 27.37 kg/m2  SpO2  97%, BMI Body mass index is 27.37 kg/(m^2).  Wt Readings from Last 3 Encounters:  08/31/14 185 lb 6.4 oz (84.097 kg)  07/20/14 187 lb (84.823 kg)  07/14/14 182 lb 14.4 oz (82.963 kg)     Appears comfortable at rest. Uses a cane to walk. HEENT: Conjunctiva and lids normal, oropharynx clear.  Neck: Supple, no elevated JVP or carotid bruits, no thyromegaly.  Lungs: Clear to auscultation, nonlabored breathing at rest.  Cardiac: Regular rate and rhythm, no S3 or significant systolic murmur, no pericardial rub.  Abdomen: Soft, nontender, bowel sounds present, no guarding or  rebound.  Extremities: No pitting edema, distal pulses 2+. Skin: Warm and dry. Musculoskeletal: No kyphosis. Neuropsychiatric: Alert and oriented 3, mild right sided leg and arm weakness.   ECG: ECG is not ordered today.  Recent Labwork: 07/14/2014: ALT 16*; AST 18; BUN 16; Creatinine, Ser 0.84; Hemoglobin 14.4; Platelets 213; Potassium 3.8; Sodium 140     Component Value Date/Time   CHOL 158 07/14/2014 0910   TRIG 136 07/14/2014 0910   HDL 36* 07/14/2014 0910   CHOLHDL 4.4 07/14/2014 0910   VLDL 27 07/14/2014 0910   LDLCALC 95 07/14/2014 0910    ASSESSMENT AND PLAN:  1. History of stroke in May as outlined above. Recent cardiac monitoring did not uncover any obvious atrial arrhythmias. When he completes his 6 month course of Xarelto for left cerebral venous thrombosis per Dr. Merlene Laughter, he can likely transition back to antiplatelets therapy.  2. CAD status post previous intervention to the LAD and diagonal in 2001. Symptomatically stable. Continue observation.  3. Essential hypertension, blood pressure is normal today.  Current medicines were reviewed at length with the patient today.   Disposition: FU with me in 4 months.   Signed, Satira Sark, MD, [facility] 08/31/2014 8:40 AM    [place] at [facility] [address] [place], [place] [address] Phone: [phone]; Fax: [phone]

## 2014-08-31 NOTE — Patient Instructions (Signed)
Your physician wants you to follow-up in: 4 months with Dr.McDowell You will receive a reminder letter in the mail two months in advance. If you don't receive a letter, please call our office to schedule the follow-up appointment.    Your physician recommends that you continue on your current medications as directed. Please refer to the Current Medication list given to you today.     Thank you for choosing [facility] !    

## 2014-09-18 ENCOUNTER — Telehealth: Payer: Self-pay

## 2014-09-18 NOTE — Telephone Encounter (Signed)
Per rep Karn Pickler daniel,pay has a zero balance and was not charged $2,000.00 as patient stated regarding his event monitor

## 2014-11-01 ENCOUNTER — Encounter: Payer: Self-pay | Admitting: Cardiology

## 2014-11-01 ENCOUNTER — Ambulatory Visit (INDEPENDENT_AMBULATORY_CARE_PROVIDER_SITE_OTHER): Payer: PPO | Admitting: Cardiology

## 2014-11-01 VITALS — BP 122/68 | HR 62 | Ht 69.0 in | Wt 179.8 lb

## 2014-11-01 DIAGNOSIS — I2510 Atherosclerotic heart disease of native coronary artery without angina pectoris: Secondary | ICD-10-CM

## 2014-11-01 DIAGNOSIS — Z8673 Personal history of transient ischemic attack (TIA), and cerebral infarction without residual deficits: Secondary | ICD-10-CM | POA: Diagnosis not present

## 2014-11-01 NOTE — Patient Instructions (Signed)
Your physician wants you to follow-up in:  4 months Dr McDowell You will receive a reminder letter in the mail two months in advance. If you don't receive a letter, please call our office to schedule the follow-up appointment.    Your physician recommends that you continue on your current medications as directed. Please refer to the Current Medication list given to you today.      Thank you for choosing [facility] !        

## 2014-11-01 NOTE — Progress Notes (Signed)
Cardiology Office Note  Date: 11/01/2014   ID: LYNDELL [name], DOB 03/29/1940, MRN [id number]  PCP: Delphina Cahill, MD  Primary Cardiologist: Rozann Lesches, MD   Chief Complaint  Patient presents with  . Coronary Artery Disease    History of Present Illness: [name] [name] is a 74 y.o. male last seen in July. He presents for a routine follow-up visit. States that he is gaining more stamina and energy. No angina symptoms.  He continues to follow with Dr. Nevada Crane and will be seeing Dr. Merlene Laughter later this month. He is followed by Dr. Merlene Laughter with a history of stroke in May (acute left subcortical infarct involving the insular cortex and extending into the ventricular region) and also incidentally noted left cerebral venous thrombosis for which he is being treated with Xarelto (planned 6 month course). As documented in the previous note, he has not had any atrial arrhythmias to require longer-term anticoagulation.  We discussed his medications, I expect that he will ultimately transition from Xarelto back to anti-platelet regimen after treatment course completed as defined by Dr. Merlene Laughter.  Past Medical History  Diagnosis Date  . Coronary atherosclerosis of native coronary artery     PTCA/stent to LAD and diagonal 2001, LVEF 55%  . Hyperlipidemia   . Essential hypertension, benign   . Drug intolerance     ACE inhibitor; related to cough   . MI (myocardial infarction)   . CVA (cerebral vascular accident) 01/2011    Resolved dysarthria and right hemiparesis  . Partial small bowel obstruction 12//2012    Resolved spontaneously.    Current Outpatient Prescriptions  Medication Sig Dispense Refill  . metoprolol tartrate (LOPRESSOR) 25 MG tablet Take 12.5 mg by mouth once.    . nitroGLYCERIN (NITROSTAT) 0.4 MG SL tablet Place 1 tablet (0.4 mg total) under the tongue every 5 (five) minutes as needed. 25 tablet 1  . polycarbophil (FIBERCON) 625 MG tablet Take 1,250 mg by mouth daily.        [name] Glycol-Propyl Glycol (SYSTANE OP) Apply 1 drop to eye daily as needed (Dry Eyes).    . rivaroxaban (XARELTO) 20 MG TABS tablet Take 1 tablet (20 mg total) by mouth daily with supper. 30 tablet 10  . senna-docusate (SENOKOT-S) 8.6-50 MG per tablet Take 1 tablet by mouth at bedtime as needed for mild constipation. 30 tablet 0  . simvastatin (ZOCOR) 40 MG tablet Take 40 mg by mouth daily.    Marland Kitchen telmisartan (MICARDIS) 80 MG tablet Take 80 mg by mouth daily.     No current facility-administered medications for this visit.    Allergies:  Penicillins   Social History: The patient  reports that he quit smoking about 56 years ago. His smoking use included Cigarettes. He has never used smokeless tobacco. He reports that he does not drink alcohol or use illicit drugs.   ROS:  Please see the history of present illness. Otherwise, complete review of systems is positive for none.  All other systems are reviewed and negative.   Physical Exam: VS:  BP 122/68 mmHg  Pulse 62  Ht 5\' 9"  (1.753 m)  Wt 179 lb 12.8 oz (81.557 kg)  BMI 26.54 kg/m2  SpO2 98%, BMI Body mass index is 26.54 kg/(m^2).  Wt Readings from Last 3 Encounters:  11/01/14 179 lb 12.8 oz (81.557 kg)  08/31/14 185 lb 6.4 oz (84.097 kg)  07/20/14 187 lb (84.823 kg)     Appears comfortable at rest. Uses a cane  to walk. HEENT: Conjunctiva and lids normal, oropharynx clear.  Neck: Supple, no elevated JVP or carotid bruits, no thyromegaly.  Lungs: Clear to auscultation, nonlabored breathing at rest.  Cardiac: Regular rate and rhythm, no S3 or significant systolic murmur, no pericardial rub.  Abdomen: Soft, nontender, bowel sounds present, no guarding or rebound.  Extremities: No pitting edema, distal pulses 2+. Skin: Warm and dry. Musculoskeletal: No kyphosis. Neuropsychiatric: Alert and oriented 3, mild right sided leg and arm weakness.   ECG: ECG is not ordered today.  Recent Labwork: 07/14/2014: ALT 16*; AST 18;  BUN 16; Creatinine, Ser 0.84; Hemoglobin 14.4; Platelets 213; Potassium 3.8; Sodium 140     Component Value Date/Time   CHOL 158 07/14/2014 0910   TRIG 136 07/14/2014 0910   HDL 36* 07/14/2014 0910   CHOLHDL 4.4 07/14/2014 0910   VLDL 27 07/14/2014 0910   LDLCALC 95 07/14/2014 0910    ASSESSMENT AND PLAN:  1. Symptomatically stable CAD status post previous intervention to the LAD and diagonal, no active angina symptoms on medical therapy.  2. History of stroke in May as outlined above, also left cerebral venous thrombosis. My understanding is that Dr. Merlene Laughter plans a six-month treatment course on Xarelto. He will likely go back to an anti-platelet regimen after that. He did not have any atrial arrhythmias documented by cardiac monitoring to indicate longer-term anticoagulation.  Current medicines were reviewed at length with the patient today.  Disposition: FU with me in 4 months.   Signed, Satira Sark, MD, [facility] 11/01/2014 3:26 PM    [facility] at [facility] [address] [place], [place] [address] Phone: [phone]; Fax: [phone]

## 2014-11-13 ENCOUNTER — Other Ambulatory Visit: Payer: Self-pay | Admitting: Neurology

## 2014-11-13 DIAGNOSIS — G08 Intracranial and intraspinal phlebitis and thrombophlebitis: Secondary | ICD-10-CM

## 2014-11-27 ENCOUNTER — Ambulatory Visit (HOSPITAL_COMMUNITY)
Admission: RE | Admit: 2014-11-27 | Discharge: 2014-11-27 | Disposition: A | Discharge status: Home / Self Care | Payer: PPO | Source: Ambulatory Visit | Attending: Neurology | Admitting: Neurology

## 2014-11-27 DIAGNOSIS — G08 Intracranial and intraspinal phlebitis and thrombophlebitis: Secondary | ICD-10-CM

## 2014-11-27 DIAGNOSIS — I651 Occlusion and stenosis of basilar artery: Secondary | ICD-10-CM | POA: Diagnosis not present

## 2014-11-27 DIAGNOSIS — I10 Essential (primary) hypertension: Secondary | ICD-10-CM | POA: Diagnosis not present

## 2014-11-27 DIAGNOSIS — R55 Syncope and collapse: Secondary | ICD-10-CM | POA: Insufficient documentation

## 2014-11-27 DIAGNOSIS — I6622 Occlusion and stenosis of left posterior cerebral artery: Secondary | ICD-10-CM | POA: Diagnosis not present

## 2014-11-27 DIAGNOSIS — I668 Occlusion and stenosis of other cerebral arteries: Secondary | ICD-10-CM | POA: Insufficient documentation

## 2015-03-07 DIAGNOSIS — E782 Mixed hyperlipidemia: Secondary | ICD-10-CM | POA: Diagnosis not present

## 2015-03-07 DIAGNOSIS — I259 Chronic ischemic heart disease, unspecified: Secondary | ICD-10-CM | POA: Diagnosis not present

## 2015-03-07 DIAGNOSIS — I10 Essential (primary) hypertension: Secondary | ICD-10-CM | POA: Diagnosis not present

## 2015-03-07 DIAGNOSIS — R7301 Impaired fasting glucose: Secondary | ICD-10-CM | POA: Diagnosis not present

## 2015-03-14 DIAGNOSIS — I25119 Atherosclerotic heart disease of native coronary artery with unspecified angina pectoris: Secondary | ICD-10-CM | POA: Diagnosis not present

## 2015-03-14 DIAGNOSIS — E782 Mixed hyperlipidemia: Secondary | ICD-10-CM | POA: Diagnosis not present

## 2015-03-14 DIAGNOSIS — E559 Vitamin D deficiency, unspecified: Secondary | ICD-10-CM | POA: Diagnosis not present

## 2015-03-14 DIAGNOSIS — H9319 Tinnitus, unspecified ear: Secondary | ICD-10-CM | POA: Diagnosis not present

## 2015-03-14 DIAGNOSIS — R7301 Impaired fasting glucose: Secondary | ICD-10-CM | POA: Diagnosis not present

## 2015-03-21 DIAGNOSIS — H2513 Age-related nuclear cataract, bilateral: Secondary | ICD-10-CM | POA: Diagnosis not present

## 2015-04-20 ENCOUNTER — Encounter: Payer: Self-pay | Admitting: Cardiology

## 2015-04-20 ENCOUNTER — Ambulatory Visit (INDEPENDENT_AMBULATORY_CARE_PROVIDER_SITE_OTHER): Payer: PPO | Admitting: Cardiology

## 2015-04-20 VITALS — BP 112/62 | HR 59 | Ht 69.0 in | Wt 179.0 lb

## 2015-04-20 DIAGNOSIS — I2510 Atherosclerotic heart disease of native coronary artery without angina pectoris: Secondary | ICD-10-CM | POA: Diagnosis not present

## 2015-04-20 DIAGNOSIS — R001 Bradycardia, unspecified: Secondary | ICD-10-CM | POA: Diagnosis not present

## 2015-04-20 DIAGNOSIS — E785 Hyperlipidemia, unspecified: Secondary | ICD-10-CM

## 2015-04-20 MED ORDER — NITROGLYCERIN 0.4 MG SL SUBL: 0.4000 mg | SUBLINGUAL_TABLET | SUBLINGUAL | Status: DC | PRN

## 2015-04-20 NOTE — Patient Instructions (Signed)
Your physician wants you to follow-up in: 6 months with Dr Ferne Reus will receive a reminder letter in the mail two months in advance. If you don't receive a letter, please call our office to schedule the follow-up appointment.   STOP Lopressor    Thank you for choosing [facility] !

## 2015-04-20 NOTE — Progress Notes (Signed)
Cardiology Office Note  Date: 04/20/2015   ID: BRAIAN MERRIGAN, DOB [birth year], MRN [id number]  PCP: Wende Neighbors, MD  Primary Cardiologist: Rozann Lesches, MD   Chief Complaint  Patient presents with  . Coronary Artery Disease    History of Present Illness: [name] [name] is a 75 y.o. male last seen in September 2016. He presents today for a routine follow-up visit. He has had no angina symptoms but states that his stamina has been somewhat down. Not specifically short of breath, just has to rest more.  We reviewed his medications which are outlined below. Blood pressure is well controlled and his heart rate is fairly low even on very low-dose Lopressor. We discussed stopping that medication altogether. Otherwise no changes were made, he continues on statin therapy, and his recent lipids are reviewed below.  His last stress test was in 2012. I reviewed the results. We discussed continued observation for now unless his symptoms worsen.  Past Medical History  Diagnosis Date  . Coronary atherosclerosis of native coronary artery     PTCA/stent to LAD and diagonal 2001, LVEF 55%  . Hyperlipidemia   . Essential hypertension, benign   . Drug intolerance     ACE inhibitor; related to cough   . MI (myocardial infarction) ([place])   . CVA (cerebral vascular accident) ([place]) 01/2011    Resolved dysarthria and right hemiparesis  . Partial small bowel obstruction ([place]) 12//2012    Resolved spontaneously.    Current Outpatient Prescriptions  Medication Sig Dispense Refill  . nitroGLYCERIN (NITROSTAT) 0.4 MG SL tablet Place 1 tablet (0.4 mg total) under the tongue every 5 (five) minutes as needed. 25 tablet 3  . polycarbophil (FIBERCON) 625 MG tablet Take 1,250 mg by mouth daily.      [name] Glycol-Propyl Glycol (SYSTANE OP) Apply 1 drop to eye daily as needed (Dry Eyes).    . rivaroxaban (XARELTO) 20 MG TABS tablet Take 1 tablet (20 mg total) by mouth daily with supper. 30 tablet 10    . senna-docusate (SENOKOT-S) 8.6-50 MG per tablet Take 1 tablet by mouth at bedtime as needed for mild constipation. 30 tablet 0  . simvastatin (ZOCOR) 40 MG tablet Take 40 mg by mouth daily.    Marland Kitchen telmisartan (MICARDIS) 80 MG tablet Take 80 mg by mouth daily.     No current facility-administered medications for this visit.   Allergies:  Penicillins   Social History: The patient  reports that he quit smoking about 56 years ago. His smoking use included Cigarettes. He has never used smokeless tobacco. He reports that he does not drink alcohol or use illicit drugs.   ROS:  Please see the history of present illness. Otherwise, complete review of systems is positive for decreased stamina.  All other systems are reviewed and negative.   Physical Exam: VS:  BP 112/62 mmHg  Pulse 59  Ht 5\' 9"  (1.753 m)  Wt 179 lb (81.194 kg)  BMI 26.42 kg/m2  SpO2 96%, BMI Body mass index is 26.42 kg/(m^2).  Wt Readings from Last 3 Encounters:  04/20/15 179 lb (81.194 kg)  11/01/14 179 lb 12.8 oz (81.557 kg)  08/31/14 185 lb 6.4 oz (84.097 kg)    Appears comfortable at rest. HEENT: Conjunctiva and lids normal, oropharynx clear.  Neck: Supple, no elevated JVP or carotid bruits, no thyromegaly.  Lungs: Clear to auscultation, nonlabored breathing at rest.  Cardiac: Regular rate and rhythm, no S3 or significant systolic murmur, no pericardial  rub.  Abdomen: Soft, nontender, bowel sounds present, no guarding or rebound.  Extremities: No pitting edema, distal pulses 2+.  ECG: I personally reviewed the prior tracing from 07/14/2014 which showed sinus rhythm with low voltage and possible old anterior infarct pattern.  Recent Labwork: 07/14/2014: ALT 16*; AST 18; BUN 16; Creatinine, Ser 0.84; Hemoglobin 14.4; Platelets 213; Potassium 3.8; Sodium 140     Component Value Date/Time   CHOL 158 07/14/2014 0910   TRIG 136 07/14/2014 0910   HDL 36* 07/14/2014 0910   CHOLHDL 4.4 07/14/2014 0910   VLDL 27  07/14/2014 0910   LDLCALC 95 07/14/2014 0910  January 2017: cholesterol 140, triglycerides 94, HDL, 35, LDL 86  Other Studies Reviewed Today:  Echocardiogram 07/15/2014: Study Conclusions  - Procedure narrative: Transthoracic echocardiography. Image quality was adequate. The study was technically difficult, as a result of poor acoustic windows. - Left ventricle: The cavity size was normal. There was mild concentric hypertrophy. Systolic function was normal. The estimated ejection fraction was in the range of 60% to 65%. Wall motion was normal; there were no regional wall motion abnormalities. Doppler parameters are consistent with abnormal left ventricular relaxation (grade 1 diastolic dysfunction). - Aortic valve: Mildly calcified annulus. Trileaflet. - Tricuspid valve: There was mild regurgitation. - Pulmonic valve: There was mild regurgitation.  Exercise Cardiolite 08/21/2010: IMPRESSION: Abnormal exercise Myoview as outlined. No diagnostic ST-segment changes noted. No chest pain reported. Hypertensive response noted. Perfusion imaging shows evidence of moderate anteroseptal / apical scar without frank residual ischemia. LVEF is 59%.  Assessment and Plan:  1. CAD status post previous stent to the LAD and diagonal in 2001. He does not report any angina symptoms. Has had some decrease in stamina. With bradycardia even on low-dose Lopressor, this medication will be stopped altogether. Continue observation. If symptoms worsen we will follow-up with a Cardiolite study.  2. Hyperlipidemia, recent LDL 86. He continues on Zocor.  3. Previous history of stroke and incidentally noted left cerebral venous thrombosis. He is treated with Xarelto under the direction of Dr. Merlene Laughter.  Current medicines were reviewed with the patient today.  Disposition: FU with me in 6 months.   Signed, Satira Sark, MD, [facility] 04/20/2015 3:25 PM    [place] [facility] [address] [place], [place] [address] Phone: [phone]; Fax: [phone]

## 2015-05-08 ENCOUNTER — Encounter (HOSPITAL_COMMUNITY)
Admission: RE | Admit: 2015-05-08 | Discharge: 2015-05-08 | Disposition: A | Discharge status: Home / Self Care | Payer: PPO | Source: Ambulatory Visit | Attending: Ophthalmology | Admitting: Ophthalmology

## 2015-05-08 ENCOUNTER — Encounter (HOSPITAL_COMMUNITY): Payer: Self-pay

## 2015-05-08 DIAGNOSIS — Z01812 Encounter for preprocedural laboratory examination: Secondary | ICD-10-CM | POA: Diagnosis not present

## 2015-05-08 DIAGNOSIS — H538 Other visual disturbances: Secondary | ICD-10-CM | POA: Diagnosis not present

## 2015-05-08 DIAGNOSIS — H2511 Age-related nuclear cataract, right eye: Secondary | ICD-10-CM | POA: Diagnosis not present

## 2015-05-08 HISTORY — DX: Unspecified osteoarthritis, unspecified site: M19.90

## 2015-05-08 LAB — BASIC METABOLIC PANEL
Anion gap: 8 (ref 5–15)
BUN: 18 mg/dL (ref 6–20)
CALCIUM: 8.9 mg/dL (ref 8.9–10.3)
CO2: 26 mmol/L (ref 22–32)
CREATININE: 0.76 mg/dL (ref 0.61–1.24)
Chloride: 107 mmol/L (ref 101–111)
GFR calc Af Amer: >60 mL/min (ref >60)
GFR calc non Af Amer: >60 mL/min (ref >60)
Glucose, Bld: 107 mg/dL — ABNORMAL HIGH (ref 65–99)
Potassium: 4.2 mmol/L (ref 3.5–5.1)
Sodium: 141 mmol/L (ref 135–145)

## 2015-05-08 LAB — CBC WITH DIFFERENTIAL/PLATELET
BASOS PCT: 0 %
Basophils Absolute: 0 10*3/uL (ref 0.0–0.1)
Eosinophils Absolute: 0.2 10*3/uL (ref 0.0–0.7)
Eosinophils Relative: 2 %
HEMATOCRIT: 41.4 % (ref 39.0–52.0)
Hemoglobin: 14.4 g/dL (ref 13.0–17.0)
LYMPHS ABS: 3 10*3/uL (ref 0.7–4.0)
Lymphocytes Relative: 38 %
MCH: 30.4 pg (ref 26.0–34.0)
MCHC: 34.8 g/dL (ref 30.0–36.0)
MCV: 87.3 fL (ref 78.0–100.0)
MONOS PCT: 6 %
Monocytes Absolute: 0.5 10*3/uL (ref 0.1–1.0)
NEUTROS ABS: 4.2 10*3/uL (ref 1.7–7.7)
Neutrophils Relative %: 54 %
Platelets: 213 10*3/uL (ref 150–400)
RBC: 4.74 MIL/uL (ref 4.22–5.81)
RDW: 13 % (ref 11.5–15.5)
WBC: 7.8 10*3/uL (ref 4.0–10.5)

## 2015-05-08 NOTE — Patient Instructions (Signed)
Your procedure is scheduled on: 05/14/2015  Report to [facility] at  750   AM.  Call this number if you have problems the morning of surgery: [phone]   Do not eat food or drink liquids :After Midnight.      Take these medicines the morning of surgery with A SIP OF WATER: micardis   Do not wear jewelry, make-up or nail polish.  Do not wear lotions, powders, or perfumes. You may wear deodorant.  Do not shave 48 hours prior to surgery.  Do not bring valuables to the hospital.  Contacts, dentures or bridgework may not be worn into surgery.  Leave suitcase in the car. After surgery it may be brought to your room.  For patients admitted to the hospital, checkout time is 11:00 AM the day of discharge.   Patients discharged the day of surgery will not be allowed to drive home.  :     Please read over the following fact sheets that you were given: Coughing and Deep Breathing, Surgical Site Infection Prevention, Anesthesia Post-op Instructions and Care and Recovery After Surgery    Cataract A cataract is a clouding of the lens of the eye. When a lens becomes cloudy, vision is reduced based on the degree and nature of the clouding. Many cataracts reduce vision to some degree. Some cataracts make people more near-sighted as they develop. Other cataracts increase glare. Cataracts that are ignored and become worse can sometimes look white. The white color can be seen through the pupil. CAUSES   Aging. However, cataracts may occur at any age, even in newborns.   Certain drugs.   Trauma to the eye.   Certain diseases such as diabetes.   Specific eye diseases such as chronic inflammation inside the eye or a sudden attack of a rare form of glaucoma.   Inherited or acquired medical problems.  SYMPTOMS   Gradual, progressive drop in vision in the affected eye.   Severe, rapid visual loss. This most often happens when trauma is the cause.  DIAGNOSIS  To detect a cataract, an eye doctor  examines the lens. Cataracts are best diagnosed with an exam of the eyes with the pupils enlarged (dilated) by drops.  TREATMENT  For an early cataract, vision may improve by using different eyeglasses or stronger lighting. If that does not help your vision, surgery is the only effective treatment. A cataract needs to be surgically removed when vision loss interferes with your everyday activities, such as driving, reading, or watching TV. A cataract may also have to be removed if it prevents examination or treatment of another eye problem. Surgery removes the cloudy lens and usually replaces it with a substitute lens (intraocular lens, IOL).  At a time when both you and your doctor agree, the cataract will be surgically removed. If you have cataracts in both eyes, only one is usually removed at a time. This allows the operated eye to heal and be out of danger from any possible problems after surgery (such as infection or poor wound healing). In rare cases, a cataract may be doing damage to your eye. In these cases, your caregiver may advise surgical removal right away. The vast majority of people who have cataract surgery have better vision afterward. HOME CARE INSTRUCTIONS  If you are not planning surgery, you may be asked to do the following:  Use different eyeglasses.   Use stronger or brighter lighting.   Ask your eye doctor about reducing your medicine dose  or changing medicines if it is thought that a medicine caused your cataract. Changing medicines does not make the cataract go away on its own.   Become familiar with your surroundings. Poor vision can lead to injury. Avoid bumping into things on the affected side. You are at a higher risk for tripping or falling.   Exercise extreme care when driving or operating machinery.   Wear sunglasses if you are sensitive to bright light or experiencing problems with glare.  SEEK IMMEDIATE MEDICAL CARE IF:   You have a worsening or sudden vision  loss.   You notice redness, swelling, or increasing pain in the eye.   You have a fever.  Document Released: 02/10/2005 Document Revised: 01/30/2011 Document Reviewed: 10/04/2010 [facility] Patient Information 2012 [place].PATIENT INSTRUCTIONS POST-ANESTHESIA  IMMEDIATELY FOLLOWING SURGERY:  Do not drive or operate machinery for the first twenty four hours after surgery.  Do not make any important decisions for twenty four hours after surgery or while taking narcotic pain medications or sedatives.  If you develop intractable nausea and vomiting or a severe headache please notify your doctor immediately.  FOLLOW-UP:  Please make an appointment with your surgeon as instructed. You do not need to follow up with anesthesia unless specifically instructed to do so.  WOUND CARE INSTRUCTIONS (if applicable):  Keep a dry clean dressing on the anesthesia/puncture wound site if there is drainage.  Once the wound has quit draining you may leave it open to air.  Generally you should leave the bandage intact for twenty four hours unless there is drainage.  If the epidural site drains for more than 36-48 hours please call the anesthesia department.  QUESTIONS?:  Please feel free to call your physician or the hospital operator if you have any questions, and they will be happy to assist you.

## 2015-05-14 ENCOUNTER — Ambulatory Visit (HOSPITAL_COMMUNITY): Payer: PPO | Admitting: Anesthesiology

## 2015-05-14 ENCOUNTER — Encounter (HOSPITAL_COMMUNITY)
Admission: RE | Disposition: A | Discharge status: Home / Self Care | Payer: Self-pay | Source: Ambulatory Visit | Attending: Ophthalmology

## 2015-05-14 ENCOUNTER — Encounter (HOSPITAL_COMMUNITY): Payer: Self-pay | Admitting: *Deleted

## 2015-05-14 ENCOUNTER — Ambulatory Visit (HOSPITAL_COMMUNITY)
Admission: RE | Admit: 2015-05-14 | Discharge: 2015-05-14 | Disposition: A | Discharge status: Home / Self Care | Payer: PPO | Source: Ambulatory Visit | Attending: Ophthalmology | Admitting: Ophthalmology

## 2015-05-14 DIAGNOSIS — I10 Essential (primary) hypertension: Secondary | ICD-10-CM | POA: Insufficient documentation

## 2015-05-14 DIAGNOSIS — I6990 Unspecified sequelae of unspecified cerebrovascular disease: Secondary | ICD-10-CM | POA: Diagnosis not present

## 2015-05-14 DIAGNOSIS — I252 Old myocardial infarction: Secondary | ICD-10-CM | POA: Diagnosis not present

## 2015-05-14 DIAGNOSIS — H269 Unspecified cataract: Secondary | ICD-10-CM | POA: Diagnosis not present

## 2015-05-14 DIAGNOSIS — Z7901 Long term (current) use of anticoagulants: Secondary | ICD-10-CM | POA: Insufficient documentation

## 2015-05-14 DIAGNOSIS — I2510 Atherosclerotic heart disease of native coronary artery without angina pectoris: Secondary | ICD-10-CM | POA: Insufficient documentation

## 2015-05-14 DIAGNOSIS — H538 Other visual disturbances: Secondary | ICD-10-CM | POA: Diagnosis not present

## 2015-05-14 DIAGNOSIS — Z7982 Long term (current) use of aspirin: Secondary | ICD-10-CM | POA: Insufficient documentation

## 2015-05-14 DIAGNOSIS — H2511 Age-related nuclear cataract, right eye: Secondary | ICD-10-CM | POA: Diagnosis not present

## 2015-05-14 HISTORY — PX: CATARACT EXTRACTION W/PHACO: SHX586

## 2015-05-14 SURGERY — PHACOEMULSIFICATION, CATARACT, WITH IOL INSERTION
Anesthesia: Monitor Anesthesia Care | Laterality: Right

## 2015-05-14 MED ORDER — BSS IO SOLN
INTRAOCULAR | Status: AC
  Filled 2015-05-14: qty 4

## 2015-05-14 MED ORDER — TETRACAINE 0.5 % OP SOLN OPTIME - NO CHARGE
OPHTHALMIC | Status: DC | PRN
  Administered 2015-05-14: 1 [drp] via OPHTHALMIC

## 2015-05-14 MED ORDER — LIDOCAINE HCL 3.5 % OP GEL: 1.0000 "application " | Freq: Once | OPHTHALMIC | Status: DC

## 2015-05-14 MED ORDER — MIDAZOLAM HCL 2 MG/2ML IJ SOLN
INTRAMUSCULAR | Status: AC
  Filled 2015-05-14: qty 2

## 2015-05-14 MED ORDER — NA HYALUR & NA CHOND-NA HYALUR 0.55-0.5 ML IO KIT
PACK | INTRAOCULAR | Status: DC | PRN
  Administered 2015-05-14: 1 via OPHTHALMIC

## 2015-05-14 MED ORDER — BSS IO SOLN
INTRAOCULAR | Status: DC | PRN
  Administered 2015-05-14: 15 mL

## 2015-05-14 MED ORDER — CYCLOPENTOLATE-PHENYLEPHRINE 0.2-1 % OP SOLN
1.0000 [drp] | OPHTHALMIC | Status: AC
  Administered 2015-05-14 (×3): 1 [drp] via OPHTHALMIC

## 2015-05-14 MED ORDER — TRYPAN BLUE 0.06 % OP SOLN
OPHTHALMIC | Status: DC | PRN
  Administered 2015-05-14: 0.5 mL via INTRAOCULAR

## 2015-05-14 MED ORDER — FENTANYL CITRATE (PF) 100 MCG/2ML IJ SOLN
INTRAMUSCULAR | Status: AC
  Filled 2015-05-14: qty 2

## 2015-05-14 MED ORDER — LACTATED RINGERS IV SOLN
INTRAVENOUS | Status: DC
  Administered 2015-05-14: 09:00:00 via INTRAVENOUS

## 2015-05-14 MED ORDER — BSS IO SOLN
INTRAOCULAR | Status: DC | PRN
  Administered 2015-05-14: 500 mL via OPHTHALMIC

## 2015-05-14 MED ORDER — TRYPAN BLUE 0.06 % OP SOLN
OPHTHALMIC | Status: AC
  Filled 2015-05-14: qty 0.5

## 2015-05-14 MED ORDER — MIDAZOLAM HCL 2 MG/2ML IJ SOLN
1.0000 mg | INTRAMUSCULAR | Status: DC | PRN
  Administered 2015-05-14: 2 mg via INTRAVENOUS

## 2015-05-14 MED ORDER — FENTANYL CITRATE (PF) 100 MCG/2ML IJ SOLN
25.0000 ug | INTRAMUSCULAR | Status: AC
  Administered 2015-05-14: 25 ug via INTRAVENOUS

## 2015-05-14 MED ORDER — TETRACAINE HCL 0.5 % OP SOLN
1.0000 [drp] | OPHTHALMIC | Status: AC
  Administered 2015-05-14 (×3): 1 [drp] via OPHTHALMIC

## 2015-05-14 MED ORDER — POVIDONE-IODINE 5 % OP SOLN
OPHTHALMIC | Status: DC | PRN
  Administered 2015-05-14: 1 via OPHTHALMIC

## 2015-05-14 MED ORDER — LIDOCAINE HCL 3.5 % OP GEL
OPHTHALMIC | Status: DC | PRN
  Administered 2015-05-14: 1 via OPHTHALMIC

## 2015-05-14 SURGICAL SUPPLY — 9 items
CLOTH BEACON ORANGE TIMEOUT ST (SAFETY) ×3 IMPLANT
GLOVE BIOGEL PI IND STRL 6.5 (GLOVE) ×1 IMPLANT
GLOVE BIOGEL PI IND STRL 7.0 (GLOVE) ×1 IMPLANT
GLOVE BIOGEL PI INDICATOR 6.5 (GLOVE) ×2
GLOVE BIOGEL PI INDICATOR 7.0 (GLOVE) ×2
INST SET CATARACT ~~LOC~~ (KITS) ×3 IMPLANT
LENS ALC ACRYL/TECN (Ophthalmic Related) ×3 IMPLANT
PAD ARMBOARD 7.5X6 YLW CONV (MISCELLANEOUS) ×3 IMPLANT
WATER STERILE IRR 250ML POUR (IV SOLUTION) ×3 IMPLANT

## 2015-05-14 NOTE — Op Note (Signed)
05/14/2015  10:23 AM  PATIENT:  [name] [name]  75 y.o. male  PRE-OPERATIVE DIAGNOSIS:  cataract right eye, difficulty performing daily activities  POST-OPERATIVE DIAGNOSIS:  cataract right eye, difficulty performing daily activities  PROCEDURE:  Procedure(s): CATARACT EXTRACTION PHACO AND INTRAOCULAR LENS PLACEMENT RIGHT EYE CDE=6.96  SURGEON:  Surgeon(s): Williams Che, MD  ASSISTANTS:   Zoila Shutter, CST   ANESTHESIA STAFF: Anesthesiologist: Lerry Liner, MD CRNA: Vista Deck, CRNA  ANESTHESIA:   topical and MAC  REQUESTED LENS POWER: 22.0  LENS IMPLANT INFORMATION:  Alcon SN60WF  +22.0  S/n [id number]  Exp 08/2019  CUMULATIVE DISSIPATED ENERGY:6.96  INDICATIONS:see scanned office H&P for particulars  OP FINDINGS:dense white NS/cortical  COMPLICATIONS:None  PROCEDURE:  The patient was brought to the operating room in good condition.  The operative eye was prepped and draped in the usual fashion for intraocular surgery.  Lidocaine gel was dropped onto the eye.  A 2.4 mm 10 O'clock near clear corneal stepped incision and a 12 O'clock stab incision were created.   Vision blue dye was instilled into the anterior chamber under an air bubble and irrigated.   Viscoat was instilled into the anterior chamber.  The 5 mm anterior capsulorhexis was performed with a bent needle cystotome and Utrata forceps.  The lens was hydrodissected and hydrodelineated with a cannula and balanced salt solution and rotated with a Kuglen hook.  Phacoemulsification was perfomed in the divide and conquer technique.  The remaining cortex was removed with I&A and the capsular surfaces polished as necessary.  Provisc was placed into the capsular bag and the lens inserted with the Alcon inserter.  The viscoelastic was removed with I&A and the lens "rocked" into position.  The wounds were hydrated and te anterior chamber was refilled with balanced salt solution.  The wounds were checked for leakage and  rehydrated as necessary.  The lid speculum and drapes were removed and the patient was transported to short stay in good condition.  PATIENT DISPOSITION:  Short Stay

## 2015-05-14 NOTE — Transfer of Care (Signed)
Immediate Anesthesia Transfer of Care Note  Patient: [name] [name]  Procedure(s) Performed: Procedure(s) (LRB): CATARACT EXTRACTION PHACO AND INTRAOCULAR LENS PLACEMENT RIGHT EYE CDE=6.96 (Right)  Patient Location: Shortstay  Anesthesia Type: MAC  Level of Consciousness: awake  Airway & Oxygen Therapy: Patient Spontanous Breathing   Post-op Assessment: Report given to PACU RN, Post -op Vital signs reviewed and stable and Patient moving all extremities  Post vital signs: Reviewed and stable  Complications: No apparent anesthesia complications

## 2015-05-14 NOTE — Anesthesia Procedure Notes (Signed)
Procedure Name: MAC Date/Time: 05/14/2015 9:36 AM Performed by: Vista Deck Pre-anesthesia Checklist: Patient identified, Emergency Drugs available, Suction available, Timeout performed and Patient being monitored Patient Re-evaluated:Patient Re-evaluated prior to inductionOxygen Delivery Method: Nasal Cannula

## 2015-05-14 NOTE — Brief Op Note (Signed)
05/14/2015  10:23 AM  PATIENT:  [name] [name]  75 y.o. male  PRE-OPERATIVE DIAGNOSIS:  cataract right eye, difficulty performing daily activities  POST-OPERATIVE DIAGNOSIS:  cataract right eye, difficulty performing daily activities  PROCEDURE:  Procedure(s): CATARACT EXTRACTION PHACO AND INTRAOCULAR LENS PLACEMENT RIGHT EYE CDE=6.96  SURGEON:  Surgeon(s): Williams Che, MD  ASSISTANTS:   Zoila Shutter, CST   ANESTHESIA STAFF: Anesthesiologist: Lerry Liner, MD CRNA: Vista Deck, CRNA  ANESTHESIA:   topical and MAC  REQUESTED LENS POWER: 22.0  LENS IMPLANT INFORMATION:  Alcon SN60WF  +22.0  S/n [id number]  Exp 08/2019  CUMULATIVE DISSIPATED ENERGY:6.96  INDICATIONS:see scanned office H&P for particulars  OP FINDINGS:dense white NS/cortical  COMPLICATIONS:None  DICTATION #: none  PLAN OF CARE:  as above  PATIENT DISPOSITION:  Short Stay

## 2015-05-14 NOTE — H&P (Signed)
I have reviewed the pre printed H&P, the patient was re-examined, and I have identified no significant interval changes in the patient's medical condition.  There is no change in the plan of care since the history and physical of record. 

## 2015-05-14 NOTE — Anesthesia Postprocedure Evaluation (Signed)
  Anesthesia Post-op Note  Patient: [name] [name]  Procedure(s) Performed: Procedure(s) (LRB): CATARACT EXTRACTION PHACO AND INTRAOCULAR LENS PLACEMENT RIGHT EYE CDE=6.96 (Right)  Patient Location:  Short Stay  Anesthesia Type: MAC  Level of Consciousness: awake  Airway and Oxygen Therapy: Patient Spontanous Breathing  Post-op Pain: none  Post-op Assessment: Post-op Vital signs reviewed, Patient's Cardiovascular Status Stable, Respiratory Function Stable, Patent Airway, No signs of Nausea or vomiting and Pain level controlled  Post-op Vital Signs: Reviewed and stable  Complications: No apparent anesthesia complications

## 2015-05-14 NOTE — Anesthesia Preprocedure Evaluation (Signed)
Anesthesia Evaluation  Patient identified by MRN, date of birth, ID band Patient awake    Reviewed: Allergy & Precautions, NPO status , Patient's Chart, lab work & pertinent test results  Airway Mallampati: II  TM Distance: >3 FB     Dental  (+) Edentulous Upper, Partial Lower   Pulmonary former smoker,    breath sounds clear to auscultation       Cardiovascular hypertension, Pt. on medications (-) angina+ CAD, + Past MI, + Cardiac Stents and + Peripheral Vascular Disease   Rhythm:Regular Rate:Normal     Neuro/Psych CVA, Residual Symptoms    GI/Hepatic negative GI ROS,   Endo/Other    Renal/GU      Musculoskeletal   Abdominal   Peds  Hematology   Anesthesia Other Findings   Reproductive/Obstetrics                             Anesthesia Physical Anesthesia Plan  ASA: III  Anesthesia Plan: MAC   Post-op Pain Management:    Induction: Intravenous  Airway Management Planned: Nasal Cannula  Additional Equipment:   Intra-op Plan:   Post-operative Plan:   Informed Consent: I have reviewed the patients History and Physical, chart, labs and discussed the procedure including the risks, benefits and alternatives for the proposed anesthesia with the patient or authorized representative who has indicated his/her understanding and acceptance.     Plan Discussed with:   Anesthesia Plan Comments:         Anesthesia Quick Evaluation

## 2015-05-14 NOTE — Discharge Instructions (Signed)
[name] [name] 05/14/2015 Dr. Iona Hansen Post operative Instructions for Cataract Patients  These instructions are for [name] and pertain to the operative eye.  1.  Resume your normal diet and previous oral medicines.  2. Your Follow-up appointment is at Dr. Iona Hansen' office in [place] on May 15, 2015 at 9:15  3. You may leave the hospital when your driver is present and your nurse releases you.  4. Begin Pred Forte (prednisolone acetate 1%), Acular LS (ketorolac tromethamine .4%) and Gatifloxacin 0.5% eye drops; 1 drop each 4 times daily to operative eye. Begin 3 hours after discharge from Short Stay Unit.  Moxifloxacin 0.5% may be substituted for Gatifloxacin using the same instructions.  23. Page Dr. Iona Hansen via beeper [phone] for significant pain in or around operative eye that is not relieved by Tylenol.  6. If you took Plavix before surgery, restart it at the usual dose on the evening of surgery.  7. Wear dark glasses as necessary for excessive light sensitivity.  8. Do no forcefully rub you your operative eye.  9. Keep your operative eye dry for 1 week. You may gently clean your eyelids with a damp washcloth.  10. You may resume normal occupational activities in one week and resume driving as tolerated after the first post operative visit.  11. It is normal to have blurred vision and a scratchy sensation following surgery.  Dr. Iona Hansen: [phone]

## 2015-05-15 ENCOUNTER — Encounter (HOSPITAL_COMMUNITY): Payer: Self-pay | Admitting: Ophthalmology

## 2015-08-03 DIAGNOSIS — M792 Neuralgia and neuritis, unspecified: Secondary | ICD-10-CM | POA: Diagnosis not present

## 2015-08-07 DIAGNOSIS — L308 Other specified dermatitis: Secondary | ICD-10-CM | POA: Diagnosis not present

## 2015-08-07 DIAGNOSIS — B351 Tinea unguium: Secondary | ICD-10-CM | POA: Diagnosis not present

## 2015-08-07 DIAGNOSIS — B353 Tinea pedis: Secondary | ICD-10-CM | POA: Diagnosis not present

## 2015-08-22 DIAGNOSIS — Z961 Presence of intraocular lens: Secondary | ICD-10-CM | POA: Diagnosis not present

## 2015-08-27 DIAGNOSIS — H353132 Nonexudative age-related macular degeneration, bilateral, intermediate dry stage: Secondary | ICD-10-CM | POA: Diagnosis not present

## 2015-08-27 DIAGNOSIS — H35371 Puckering of macula, right eye: Secondary | ICD-10-CM | POA: Diagnosis not present

## 2015-08-27 DIAGNOSIS — H353211 Exudative age-related macular degeneration, right eye, with active choroidal neovascularization: Secondary | ICD-10-CM | POA: Diagnosis not present

## 2015-08-27 DIAGNOSIS — H35372 Puckering of macula, left eye: Secondary | ICD-10-CM | POA: Diagnosis not present

## 2015-08-29 DIAGNOSIS — H353211 Exudative age-related macular degeneration, right eye, with active choroidal neovascularization: Secondary | ICD-10-CM | POA: Diagnosis not present

## 2015-09-10 DIAGNOSIS — E782 Mixed hyperlipidemia: Secondary | ICD-10-CM | POA: Diagnosis not present

## 2015-09-10 DIAGNOSIS — R7301 Impaired fasting glucose: Secondary | ICD-10-CM | POA: Diagnosis not present

## 2015-09-10 DIAGNOSIS — E559 Vitamin D deficiency, unspecified: Secondary | ICD-10-CM | POA: Diagnosis not present

## 2015-09-12 DIAGNOSIS — R7301 Impaired fasting glucose: Secondary | ICD-10-CM | POA: Diagnosis not present

## 2015-09-12 DIAGNOSIS — M720 Palmar fascial fibromatosis [Dupuytren]: Secondary | ICD-10-CM | POA: Diagnosis not present

## 2015-09-12 DIAGNOSIS — G459 Transient cerebral ischemic attack, unspecified: Secondary | ICD-10-CM | POA: Diagnosis not present

## 2015-09-12 DIAGNOSIS — I25119 Atherosclerotic heart disease of native coronary artery with unspecified angina pectoris: Secondary | ICD-10-CM | POA: Diagnosis not present

## 2015-09-12 DIAGNOSIS — E782 Mixed hyperlipidemia: Secondary | ICD-10-CM | POA: Diagnosis not present

## 2015-09-12 DIAGNOSIS — H9319 Tinnitus, unspecified ear: Secondary | ICD-10-CM | POA: Diagnosis not present

## 2015-10-03 DIAGNOSIS — H353211 Exudative age-related macular degeneration, right eye, with active choroidal neovascularization: Secondary | ICD-10-CM | POA: Diagnosis not present

## 2015-10-16 NOTE — Progress Notes (Signed)
Cardiology Office Note  Date: 10/17/2015   ID: SOVEREIGN YAMANE, DOB 1940/08/17, MRN [id number]  PCP: Wende Neighbors, MD  Primary Cardiologist: Rozann Lesches, MD   Chief Complaint  Patient presents with  . Coronary Artery Disease    History of Present Illness: [name] [name] is a 75 y.o. male last seen in February 2017.He is here today for a routine follow-up visit. Reports relatively chronic fatigue, no major changes overall in stamina. NYHA class II dyspnea with typical activities and no angina symptoms or nitroglycerin use.  At the last visit we stopped Lopressor completely due to persistent bradycardia. Other cardiac regimen remained stable as outlined below.  He has had cataract surgery since I last saw him.  He has follow-up lab work with Dr. Nevada Crane in July, reviewed below.  Past Medical History:  Diagnosis Date  . Arthritis   . Coronary atherosclerosis of native coronary artery    PTCA/stent to LAD and diagonal 2001, LVEF 55%  . CVA (cerebral vascular accident) ([place]) 01/2011   Resolved dysarthria and right hemiparesis  . Drug intolerance    ACE inhibitor; related to cough   . Essential hypertension, benign   . Hyperlipidemia   . MI (myocardial infarction) ([facility]) 2000  . Partial small bowel obstruction ([place]) 12//2012   Resolved spontaneously.    Past Surgical History:  Procedure Laterality Date  . CATARACT EXTRACTION W/PHACO Right 05/14/2015   Procedure: CATARACT EXTRACTION PHACO AND INTRAOCULAR LENS PLACEMENT RIGHT EYE CDE=6.96;  Surgeon: Williams Che, MD;  Location: [facility];  Service: Ophthalmology;  Laterality: Right;  . POLYPECTOMY  2005    Current Outpatient Prescriptions  Medication Sig Dispense Refill  . aspirin 325 MG tablet Take 325 mg by mouth daily.    . Cholecalciferol (VITAMIN D PO) Take 1 tablet by mouth daily.    . clopidogrel (PLAVIX) 75 MG tablet Take 75 mg by mouth daily.    . nitroGLYCERIN (NITROSTAT) 0.4 MG SL tablet Place 1 tablet (0.4 mg  total) under the tongue every 5 (five) minutes as needed. 25 tablet 3  . polycarbophil (FIBERCON) 625 MG tablet Take 1,250 mg by mouth daily.    [name] Glycol-Propyl Glycol (SYSTANE OP) Apply 1 drop to eye daily as needed (Dry Eyes).    . simvastatin (ZOCOR) 40 MG tablet Take 40 mg by mouth daily.    Marland Kitchen telmisartan (MICARDIS) 80 MG tablet Take 80 mg by mouth daily.      No current facility-administered medications for this visit.    Allergies:  Penicillins   Social History: The patient  reports that he quit smoking about 57 years ago. His smoking use included Cigarettes. He has a 4.00 pack-year smoking history. He has never used smokeless tobacco. He reports that he does not drink alcohol or use drugs.   ROS:  Please see the history of present illness. Otherwise, complete review of systems is positive for decreased hearing.  All other systems are reviewed and negative.   Physical Exam: VS:  BP 136/64   Pulse 69   Ht 5\' 9"  (1.753 m)   Wt 181 lb (82.1 kg)   SpO2 96%   BMI 26.73 kg/m , BMI Body mass index is 26.73 kg/m.  Wt Readings from Last 3 Encounters:  10/17/15 181 lb (82.1 kg)  05/14/15 179 lb (81.2 kg)  05/08/15 179 lb (81.2 kg)    Appears comfortable at rest. HEENT: Conjunctiva and lids normal, oropharynx clear.  Neck: Supple, no elevated JVP  or carotid bruits, no thyromegaly.  Lungs: Clear to auscultation, nonlabored breathing at rest.  Cardiac: Regular rate and rhythm, no S3 or significant systolic murmur, no pericardial rub.  Abdomen: Soft, nontender, bowel sounds present, no guarding or rebound.  Extremities: No pitting edema, distal pulses 2+. Skin: Warm and dry. Musculoskeletal: No kyphosis. Neuropsychiatric: Alert and oriented 3, affect appropriate.  ECG: I personally reviewed the tracing from 07/14/2014 which showed sinus rhythm with low voltage and possible old anterior infarct pattern.  Recent Labwork:  July 2017: potassium4.8, BUN 16, creatinine  0.9, AST 14, ALT 13, Hgb 14.4, platelets 219, CHolesterol A999333, tryglicerides 123456, HDL 37, LDL 71, HgbA1C 6.0  Other Studies Reviewed Today:  Echocardiogram 07/15/2014: Study Conclusions  - Procedure narrative: Transthoracic echocardiography. Image quality was adequate. The study was technically difficult, as a result of poor acoustic windows. - Left ventricle: The cavity size was normal. There was mild concentric hypertrophy. Systolic function was normal. The estimated ejection fraction was in the range of 60% to 65%. Wall motion was normal; there were no regional wall motion abnormalities. Doppler parameters are consistent with abnormal left ventricular relaxation (grade 1 diastolic dysfunction). - Aortic valve: Mildly calcified annulus. Trileaflet. - Tricuspid valve: There was mild regurgitation. - Pulmonic valve: There was mild regurgitation.  Exercise Cardiolite 08/21/2010: IMPRESSION: Abnormal exercise Myoview as outlined. No diagnostic ST-segment changes noted. No chest pain reported. Hypertensive response noted. Perfusion imaging shows evidence of moderate anteroseptal / apical scar without frank residual ischemia. LVEF is 59%.  Assessment and Plan:  1. CAD status post LAD and diagonal intervention in 2001. He does not report any angina or nitroglycerin use. Last ischemic evaluation was in 2012. He continues to prefer conservative follow-up for now. No changes made present regimen.  2. Hyperlipidemia, recent LDL 71 on Pravachol.  3. Hypertension, blood pressure is adequately controlled today.  4. History of stroke, no new neurological symptoms.  Current medicines were reviewed with the patient today.   Orders Placed This Encounter  Procedures  . EKG 12-Lead    Disposition: Follow-up with me in 6 months.  Signed, Satira Sark, MD, [facility] 10/17/2015 9:31 AM    [facility] [place]. [address] [place],  [place] [address] Phone: [phone]; Fax: [phone]

## 2015-10-17 ENCOUNTER — Encounter: Payer: Self-pay | Admitting: Cardiology

## 2015-10-17 ENCOUNTER — Ambulatory Visit (INDEPENDENT_AMBULATORY_CARE_PROVIDER_SITE_OTHER): Payer: PPO | Admitting: Cardiology

## 2015-10-17 VITALS — BP 136/64 | HR 69 | Ht 69.0 in | Wt 181.0 lb

## 2015-10-17 DIAGNOSIS — I2510 Atherosclerotic heart disease of native coronary artery without angina pectoris: Secondary | ICD-10-CM | POA: Diagnosis not present

## 2015-10-17 DIAGNOSIS — E785 Hyperlipidemia, unspecified: Secondary | ICD-10-CM | POA: Diagnosis not present

## 2015-10-17 DIAGNOSIS — Z8673 Personal history of transient ischemic attack (TIA), and cerebral infarction without residual deficits: Secondary | ICD-10-CM

## 2015-10-17 DIAGNOSIS — I10 Essential (primary) hypertension: Secondary | ICD-10-CM

## 2015-10-17 NOTE — Patient Instructions (Signed)

## 2015-10-23 DIAGNOSIS — L308 Other specified dermatitis: Secondary | ICD-10-CM | POA: Diagnosis not present

## 2015-10-25 ENCOUNTER — Ambulatory Visit (INDEPENDENT_AMBULATORY_CARE_PROVIDER_SITE_OTHER): Payer: PPO | Admitting: Otolaryngology

## 2015-10-25 DIAGNOSIS — H9313 Tinnitus, bilateral: Secondary | ICD-10-CM

## 2015-10-25 DIAGNOSIS — H903 Sensorineural hearing loss, bilateral: Secondary | ICD-10-CM

## 2015-11-12 DIAGNOSIS — H353211 Exudative age-related macular degeneration, right eye, with active choroidal neovascularization: Secondary | ICD-10-CM | POA: Diagnosis not present

## 2015-11-26 DIAGNOSIS — H353132 Nonexudative age-related macular degeneration, bilateral, intermediate dry stage: Secondary | ICD-10-CM | POA: Diagnosis not present

## 2015-11-26 DIAGNOSIS — H35372 Puckering of macula, left eye: Secondary | ICD-10-CM | POA: Diagnosis not present

## 2015-11-26 DIAGNOSIS — H353211 Exudative age-related macular degeneration, right eye, with active choroidal neovascularization: Secondary | ICD-10-CM | POA: Diagnosis not present

## 2015-11-26 DIAGNOSIS — H35371 Puckering of macula, right eye: Secondary | ICD-10-CM | POA: Diagnosis not present

## 2015-11-28 ENCOUNTER — Telehealth: Payer: Self-pay | Admitting: Cardiology

## 2015-11-28 NOTE — Telephone Encounter (Signed)
Please call [facility] [phone] concerning a surgical clearance for this pt.

## 2015-11-29 DIAGNOSIS — Z23 Encounter for immunization: Secondary | ICD-10-CM | POA: Diagnosis not present

## 2015-11-29 NOTE — Telephone Encounter (Signed)
[facility] to send letter for clearance

## 2015-12-05 DIAGNOSIS — H35371 Puckering of macula, right eye: Secondary | ICD-10-CM | POA: Diagnosis not present

## 2015-12-13 DIAGNOSIS — H35371 Puckering of macula, right eye: Secondary | ICD-10-CM | POA: Diagnosis not present

## 2015-12-13 DIAGNOSIS — Z09 Encounter for follow-up examination after completed treatment for conditions other than malignant neoplasm: Secondary | ICD-10-CM | POA: Diagnosis not present

## 2016-01-02 DIAGNOSIS — H353211 Exudative age-related macular degeneration, right eye, with active choroidal neovascularization: Secondary | ICD-10-CM | POA: Diagnosis not present

## 2016-01-02 DIAGNOSIS — H35372 Puckering of macula, left eye: Secondary | ICD-10-CM | POA: Diagnosis not present

## 2016-01-02 DIAGNOSIS — H353132 Nonexudative age-related macular degeneration, bilateral, intermediate dry stage: Secondary | ICD-10-CM | POA: Diagnosis not present

## 2016-02-06 DIAGNOSIS — H353132 Nonexudative age-related macular degeneration, bilateral, intermediate dry stage: Secondary | ICD-10-CM | POA: Diagnosis not present

## 2016-02-06 DIAGNOSIS — H35372 Puckering of macula, left eye: Secondary | ICD-10-CM | POA: Diagnosis not present

## 2016-02-06 DIAGNOSIS — H353211 Exudative age-related macular degeneration, right eye, with active choroidal neovascularization: Secondary | ICD-10-CM | POA: Diagnosis not present

## 2016-03-07 DIAGNOSIS — I10 Essential (primary) hypertension: Secondary | ICD-10-CM | POA: Diagnosis not present

## 2016-03-07 DIAGNOSIS — E782 Mixed hyperlipidemia: Secondary | ICD-10-CM | POA: Diagnosis not present

## 2016-03-07 DIAGNOSIS — F5221 Male erectile disorder: Secondary | ICD-10-CM | POA: Diagnosis not present

## 2016-03-07 DIAGNOSIS — R7301 Impaired fasting glucose: Secondary | ICD-10-CM | POA: Diagnosis not present

## 2016-03-14 DIAGNOSIS — G459 Transient cerebral ischemic attack, unspecified: Secondary | ICD-10-CM | POA: Diagnosis not present

## 2016-03-14 DIAGNOSIS — E559 Vitamin D deficiency, unspecified: Secondary | ICD-10-CM | POA: Diagnosis not present

## 2016-03-14 DIAGNOSIS — H9319 Tinnitus, unspecified ear: Secondary | ICD-10-CM | POA: Diagnosis not present

## 2016-03-14 DIAGNOSIS — R7301 Impaired fasting glucose: Secondary | ICD-10-CM | POA: Diagnosis not present

## 2016-03-14 DIAGNOSIS — I25119 Atherosclerotic heart disease of native coronary artery with unspecified angina pectoris: Secondary | ICD-10-CM | POA: Diagnosis not present

## 2016-03-14 DIAGNOSIS — H3530 Unspecified macular degeneration: Secondary | ICD-10-CM | POA: Diagnosis not present

## 2016-03-14 DIAGNOSIS — E782 Mixed hyperlipidemia: Secondary | ICD-10-CM | POA: Diagnosis not present

## 2016-03-14 DIAGNOSIS — M720 Palmar fascial fibromatosis [Dupuytren]: Secondary | ICD-10-CM | POA: Diagnosis not present

## 2016-03-19 DIAGNOSIS — H353211 Exudative age-related macular degeneration, right eye, with active choroidal neovascularization: Secondary | ICD-10-CM | POA: Diagnosis not present

## 2016-04-23 DIAGNOSIS — H353211 Exudative age-related macular degeneration, right eye, with active choroidal neovascularization: Secondary | ICD-10-CM | POA: Diagnosis not present

## 2016-04-28 ENCOUNTER — Encounter: Payer: Self-pay | Admitting: Cardiology

## 2016-04-28 ENCOUNTER — Ambulatory Visit (INDEPENDENT_AMBULATORY_CARE_PROVIDER_SITE_OTHER): Payer: PPO | Admitting: Cardiology

## 2016-04-28 VITALS — BP 128/60 | HR 68 | Ht 69.0 in | Wt 181.0 lb

## 2016-04-28 DIAGNOSIS — E782 Mixed hyperlipidemia: Secondary | ICD-10-CM | POA: Diagnosis not present

## 2016-04-28 DIAGNOSIS — Z8673 Personal history of transient ischemic attack (TIA), and cerebral infarction without residual deficits: Secondary | ICD-10-CM

## 2016-04-28 DIAGNOSIS — I10 Essential (primary) hypertension: Secondary | ICD-10-CM

## 2016-04-28 DIAGNOSIS — I2510 Atherosclerotic heart disease of native coronary artery without angina pectoris: Secondary | ICD-10-CM | POA: Diagnosis not present

## 2016-04-28 NOTE — Patient Instructions (Addendum)
    Your physician wants you to follow-up in: 8 months with Dr Ferne Reus will receive a reminder letter in the mail two months in advance. If you don't receive a letter, please call our office to schedule the follow-up appointment.    Your physician recommends that you continue on your current medications as directed. Please refer to the Current Medication list given to you today.    If you need a refill on your cardiac medications before your next appointment, please call your pharmacy.    Thank you for choosing [facility] !

## 2016-04-28 NOTE — Progress Notes (Signed)
Cardiology Office Note  Date: 04/28/2016   ID: [name] [name], DOB 03/27/1940, MRN [id number]  PCP: Wende Neighbors, MD  Primary Cardiologist: Rozann Lesches, MD   Chief Complaint  Patient presents with  . Coronary Artery Disease    History of Present Illness: [name] [name] is a 76 y.o. male last seen in August 2017. He presents for a routine follow-up visit. Reports no angina symptoms or nitroglycerin use, no change in stamina since last encounter.  We discussed possibility of follow-up stress testing, his last assessment was in 2012. He states that he has had no significant change and is comfortable with observation for now on medical therapy.  I did review his recent lab work per Dr. Nevada [name] from January. Current cardiac regimen includes aspirin, Plavix, as needed nitroglycerin, Micardis, and Zocor.  Past Medical History:  Diagnosis Date  . Arthritis   . Coronary atherosclerosis of native coronary artery    PTCA/stent to LAD and diagonal 2001, LVEF 55%  . CVA (cerebral vascular accident) ([place]) 01/2011   Resolved dysarthria and right hemiparesis  . Drug intolerance    ACE inhibitor; related to cough   . Essential hypertension, benign   . Hyperlipidemia   . MI (myocardial infarction) 2000  . Partial small bowel obstruction 12//2012   Resolved spontaneously.    Past Surgical History:  Procedure Laterality Date  . CATARACT EXTRACTION W/PHACO Right 05/14/2015   Procedure: CATARACT EXTRACTION PHACO AND INTRAOCULAR LENS PLACEMENT RIGHT EYE CDE=6.96;  Surgeon: Williams Che, MD;  Location: [facility];  Service: Ophthalmology;  Laterality: Right;  . POLYPECTOMY  2005    Current Outpatient Prescriptions  Medication Sig Dispense Refill  . aspirin 325 MG tablet Take 325 mg by mouth daily.    . Cholecalciferol (VITAMIN D PO) Take 1 tablet by mouth daily.    . clopidogrel (PLAVIX) 75 MG tablet Take 75 mg by mouth daily.    . nitroGLYCERIN (NITROSTAT) 0.4 MG SL tablet Place 1 tablet  (0.4 mg total) under the tongue every 5 (five) minutes as needed. 25 tablet 3  . polycarbophil (FIBERCON) 625 MG tablet Take 1,250 mg by mouth daily.    [name] Glycol-Propyl Glycol (SYSTANE OP) Apply 1 drop to eye daily as needed (Dry Eyes).    . simvastatin (ZOCOR) 40 MG tablet Take 40 mg by mouth daily.    Marland Kitchen telmisartan (MICARDIS) 80 MG tablet Take 80 mg by mouth daily.      No current facility-administered medications for this visit.    Allergies:  Penicillins   Social History: The patient  reports that he quit smoking about 57 years ago. His smoking use included Cigarettes. He has a 4.00 pack-year smoking history. He has never used smokeless tobacco. He reports that he does not drink alcohol or use drugs.   ROS:  Please see the history of present illness. Otherwise, complete review of systems is positive for hearing loss.  All other systems are reviewed and negative.   Physical Exam: VS:  BP 128/60   Pulse 68   Ht 5\' 9"  (1.753 m)   Wt 181 lb (82.1 kg)   SpO2 96%   BMI 26.73 kg/m , BMI Body mass index is 26.73 kg/m.  Wt Readings from Last 3 Encounters:  04/28/16 181 lb (82.1 kg)  10/17/15 181 lb (82.1 kg)  05/14/15 179 lb (81.2 kg)    Appears comfortable at rest. HEENT: Conjunctiva and lids normal, oropharynx clear.  Neck: Supple, no elevated JVP  or carotid bruits, no thyromegaly.  Lungs: Clear to auscultation, nonlabored breathing at rest.  Cardiac: Regular rate and rhythm, no S3 or significant systolic murmur, no pericardial rub.  Abdomen: Soft, nontender, bowel sounds present, no guarding or rebound.  Extremities: No pitting edema, distal pulses 2+. Skin: Warm and dry. Musculoskeletal: No kyphosis. Neuropsychiatric: Alert and oriented 3, affect appropriate.  ECG: I personally reviewed the tracing from 10/17/2015 which showed sinus rhythm with old anterior infarct pattern.  Recent Labwork: 05/08/2015: BUN 18; Creatinine, Ser 0.76; Hemoglobin 14.4; Platelets  213; Potassium 4.2; Sodium 141     Component Value Date/Time   CHOL 158 07/14/2014 0910   TRIG 136 07/14/2014 0910   HDL 36 (L) 07/14/2014 0910   CHOLHDL 4.4 07/14/2014 0910   VLDL 27 07/14/2014 0910   LDLCALC 95 07/14/2014 0910  January 2018: Cholesterol 139, HDL 36, triglycerides 113, LDL 82, glucose 115, BUN 17, creatinine 0.9, potassium 4.9, AST 15, ALT 13, hemoglobin A1c 5.9, hemoglobin 14.7, platelets 231  Other Studies Reviewed Today:  Echocardiogram 07/15/2014: Study Conclusions  - Procedure narrative: Transthoracic echocardiography. Image   quality was adequate. The study was technically difficult, as a   result of poor acoustic windows. - Left ventricle: The cavity size was normal. There was mild   concentric hypertrophy. Systolic function was normal. The   estimated ejection fraction was in the range of 60% to 65%. Wall   motion was normal; there were no regional wall motion   abnormalities. Doppler parameters are consistent with abnormal   left ventricular relaxation (grade 1 diastolic dysfunction). - Aortic valve: Mildly calcified annulus. Trileaflet. - Tricuspid valve: There was mild regurgitation. - Pulmonic valve: There was mild regurgitation.  Exercise Cardiolite 08/21/2010: IMPRESSION: Abnormal exercise Myoview as outlined. No diagnostic ST-segment changes noted. No chest pain reported. Hypertensive response noted. Perfusion imaging shows evidence of moderate anteroseptal / apical scar without frank residual ischemia. LVEF is 59%.  Assessment and Plan:  1. Symptomatically stable CAD status post prior LAD and diagonal interventions in 2001. Last ischemic assessment was in 2012. We discussed follow-up testing today, he prefers observation on medical therapy in the absence of any decrease in stamina or angina symptoms.  2. Hyperlipidemia, on Zocor with recent LDL 82.  3. Essential hypertension, blood pressure adequately controlled today on Micardis.  4.  History of stroke, remains on dual antiplatelet regimen and statin.  Current medicines were reviewed with the patient today.  Disposition: Follow-up in 8 months.  Signed, Satira Sark, MD, [facility] 04/28/2016 10:32 AM    [facility] at [place]. [address], [place], [place] [address] Phone: [phone]; Fax: [phone]

## 2016-06-04 DIAGNOSIS — H353211 Exudative age-related macular degeneration, right eye, with active choroidal neovascularization: Secondary | ICD-10-CM | POA: Diagnosis not present

## 2016-07-04 ENCOUNTER — Telehealth: Payer: Self-pay | Admitting: *Deleted

## 2016-07-04 ENCOUNTER — Encounter: Payer: Self-pay | Admitting: *Deleted

## 2016-07-04 ENCOUNTER — Ambulatory Visit (INDEPENDENT_AMBULATORY_CARE_PROVIDER_SITE_OTHER): Payer: PPO | Admitting: Adult Health

## 2016-07-04 ENCOUNTER — Encounter: Payer: Self-pay | Admitting: Adult Health

## 2016-07-04 VITALS — BP 138/72 | HR 75 | Ht 69.0 in | Wt 183.0 lb

## 2016-07-04 DIAGNOSIS — R079 Chest pain, unspecified: Secondary | ICD-10-CM

## 2016-07-04 DIAGNOSIS — I2510 Atherosclerotic heart disease of native coronary artery without angina pectoris: Secondary | ICD-10-CM | POA: Diagnosis not present

## 2016-07-04 MED ORDER — HYDROCORTISONE 2.5 % EX CREA: TOPICAL_CREAM | CUTANEOUS | 0 refills | Status: DC | PRN

## 2016-07-04 NOTE — Telephone Encounter (Signed)
Noted  

## 2016-07-04 NOTE — Patient Instructions (Signed)
Your physician recommends that you schedule a follow-up appointment after test.   Your physician recommends that you continue on your current medications as directed. Please refer to the Current Medication list given to you today.  Your physician has requested that you have en exercise stress myoview. For further information please visit [email or web address]. Please follow instruction sheet, as given.  If you need a refill on your cardiac medications before your next appointment, please call your pharmacy.  Thank you for choosing [facility]!

## 2016-07-04 NOTE — Progress Notes (Signed)
Cardiology Office Note   Date:  07/04/2016   ID:  [name] [name], DOB [birth year], MRN [id number]  PCP:  Celene Squibb, MD  Cardiologist: Billee Cashing Dowell/  Jory Sims, NP   Chief Complaint  Patient presents with  . Chest Pain  . Coronary Artery Disease      History of Present Illness: [name] [name] is a 76 y.o. male who presents for ongoing assessment of CAD, known DES to LAD and diagonal 2001, hypertension. He comes today with complaints of recurrent chest pain. He states that over the last 2-3 weeks during sexual activity, he has stinging chest pain across his chest. Last about 10 minutes. This does not stop the sexual activity and he is able to complete it. He states the pain goes away when he is resting. This does not occur with any other strenuous activity such as yard work, lifting or carrying heavy objects. Pain does not occur with rest.  Pain is not similar to that which he felt prior to DES to LAD in 2001, as he states he was having an MI at that time.   Past Medical History:  Diagnosis Date  . Arthritis   . Coronary atherosclerosis of native coronary artery    PTCA/stent to LAD and diagonal 2001, LVEF 55%  . CVA (cerebral vascular accident) ([place]) 01/2011   Resolved dysarthria and right hemiparesis  . Drug intolerance    ACE inhibitor; related to cough   . Essential hypertension, benign   . Hyperlipidemia   . MI (myocardial infarction) ([place]) 2000  . Partial small bowel obstruction ([place]) 12//2012   Resolved spontaneously.    Past Surgical History:  Procedure Laterality Date  . CATARACT EXTRACTION W/PHACO Right 05/14/2015   Procedure: CATARACT EXTRACTION PHACO AND INTRAOCULAR LENS PLACEMENT RIGHT EYE CDE=6.96;  Surgeon: Williams Che, MD;  Location: [facility];  Service: Ophthalmology;  Laterality: Right;  . POLYPECTOMY  2005     Current Outpatient Prescriptions  Medication Sig Dispense Refill  . aspirin 325 MG tablet Take 325 mg by mouth daily.    . Cholecalciferol  (VITAMIN D PO) Take 1 tablet by mouth daily.    . clopidogrel (PLAVIX) 75 MG tablet Take 75 mg by mouth daily.    . nitroGLYCERIN (NITROSTAT) 0.4 MG SL tablet Place 1 tablet (0.4 mg total) under the tongue every 5 (five) minutes as needed. 25 tablet 3  . polycarbophil (FIBERCON) 625 MG tablet Take 1,250 mg by mouth daily.    [name] Glycol-Propyl Glycol (SYSTANE OP) Apply 1 drop to eye daily as needed (Dry Eyes).    . simvastatin (ZOCOR) 40 MG tablet Take 40 mg by mouth daily.    Marland Kitchen telmisartan (MICARDIS) 80 MG tablet Take 80 mg by mouth daily.     . hydrocortisone 2.5 % cream Apply topically as needed. 30 g 0   No current facility-administered medications for this visit.     Allergies:   Penicillins    Social History:  The patient  reports that he quit smoking about 57 years ago. His smoking use included Cigarettes. He has a 4.00 pack-year smoking history. He has never used smokeless tobacco. He reports that he does not drink alcohol or use drugs.   Family History:  The patient's family history includes Cancer in his brother, father, mother, and sister; Diabetes in his sister; Heart failure in his father; Hyperlipidemia in his father; Hypertension in his sister.    ROS: All other systems are reviewed and negative.  Unless otherwise mentioned in H&P    PHYSICAL EXAM: VS:  BP 138/72   Pulse 75   Ht 5\' 9"  (1.753 m)   Wt 183 lb (83 kg)   SpO2 94%   BMI 27.02 kg/m  , BMI Body mass index is 27.02 kg/m. GEN: Well nourished, well developed, in no acute distress  HEENT: normal  Neck: no JVD, carotid bruits, or masses Cardiac: *RRR; bradycardic no murmurs, rubs, or gallops,no edema  Respiratory:  clear to auscultation bilaterally, normal work of breathing GI: soft, nontender, nondistended, + BS MS: no deformity or atrophy  Skin: warm and dry, no rash Neuro:  Strength and sensation are intact Psych: euthymic mood, full affect   EKG:  NSR with old anterior infarct, rate of 69 bpm.    Recent Labs: No results found for requested labs within last 8760 hours.    Lipid Panel    Component Value Date/Time   CHOL 158 07/14/2014 0910   TRIG 136 07/14/2014 0910   HDL 36 (L) 07/14/2014 0910   CHOLHDL 4.4 07/14/2014 0910   VLDL 27 07/14/2014 0910   LDLCALC 95 07/14/2014 0910      Wt Readings from Last 3 Encounters:  07/04/16 183 lb (83 kg)  04/28/16 181 lb (82.1 kg)  10/17/15 181 lb (82.1 kg)      Other studies Reviewed: Echo 07/15/2014 Procedure narrative: Transthoracic echocardiography. Image   quality was adequate. The study was technically difficult, as a   result of poor acoustic windows. - Left ventricle: The cavity size was normal. There was mild   concentric hypertrophy. Systolic function was normal. The   estimated ejection fraction was in the range of 60% to 65%. Wall   motion was normal; there were no regional wall motion   abnormalities. Doppler parameters are consistent with abnormal   left ventricular relaxation (grade 1 diastolic dysfunction). - Aortic valve: Mildly calcified annulus. Trileaflet. - Tricuspid valve: There was mild regurgitation. - Pulmonic valve: There was mild regurgitation.  Cardiac Event Monitor 07/20/2014  Study Highlights   Data from day #1: Sinus bradycardia with rates 40-50s and frequent PACs. Average HR not provided. No prolonged pauses noted.       ASSESSMENT AND PLAN:  1. CAD: DES to LAD in 2001. He remains on DAPT with ASA and Plavix, and on statin therapy. Currently not on BB due to history of bradycardia. Will not start today.   2. Exertional chest pain; States the pain occurs with sexual activity only. Described as burning, stinging pain across front of chest. Does not stop him from completing sexual act, and goes away with rest. No other exertional activity causes him to have chest discomfort. Will plan stress MPI for evaluation of progressive CAD. NTG for prn use will continue.  I have explained that he will  not be able to drink caffeine or eat the day of stress test. I have reviewed his medications, and he is to take them as directed with a sip of water.   3. [place] rash: Noted on right forearm. Hydrocortisone cream Rx.      Current medicines are reviewed at length with the patient today.    Labs/ tests ordered today include:   Orders Placed This Encounter  Procedures  . NM Myocar Multi W/Spect W/Wall Motion / EF  . EKG 12-Lead     Disposition:   FU with post stress test.   Signed, @Mid  February and now@  07/04/2016 4:01 PM    [facility] [address] [place], [place] [address] Phone: [phone]; Fax: [phone]

## 2016-07-04 NOTE — Telephone Encounter (Signed)
Patient called and c/o chest discomfort over the last 3-4 weeks. Pt states pain last 10-15 min and he rates pain 5/10. He denies SOB, sweating, nausea, arm numbness, and pain radiation. The last time pt experienced pain was last night at 2100 or 2130. Pt has appt to see Jory Sims, NP today at 3:30.

## 2016-07-10 ENCOUNTER — Encounter (HOSPITAL_BASED_OUTPATIENT_CLINIC_OR_DEPARTMENT_OTHER)
Admission: RE | Admit: 2016-07-10 | Discharge: 2016-07-10 | Disposition: A | Discharge status: Home / Self Care | Payer: PPO | Source: Ambulatory Visit | Attending: Adult Health | Admitting: Adult Health

## 2016-07-10 ENCOUNTER — Encounter (HOSPITAL_COMMUNITY)
Admission: RE | Admit: 2016-07-10 | Discharge: 2016-07-10 | Disposition: A | Discharge status: Home / Self Care | Payer: PPO | Source: Ambulatory Visit | Attending: Adult Health | Admitting: Adult Health

## 2016-07-10 ENCOUNTER — Encounter (HOSPITAL_COMMUNITY): Payer: Self-pay

## 2016-07-10 DIAGNOSIS — I2510 Atherosclerotic heart disease of native coronary artery without angina pectoris: Secondary | ICD-10-CM | POA: Insufficient documentation

## 2016-07-10 DIAGNOSIS — R079 Chest pain, unspecified: Secondary | ICD-10-CM

## 2016-07-10 LAB — NM MYOCAR MULTI W/SPECT W/WALL MOTION / EF
CHL CUP NUCLEAR SRS: 8
CHL RATE OF PERCEIVED EXERTION: 13
CSEPED: 6 min
CSEPEW: 7.7 METS
CSEPHR: 79 %
Exercise duration (sec): 16 s
LHR: 0.39
LV dias vol: 88 mL (ref 62–150)
LV sys vol: 38 mL
MPHR: 143 {beats}/min
Peak HR: 114 {beats}/min
Rest HR: 56 {beats}/min
SDS: 2
SSS: 10
TID: 0.98

## 2016-07-10 MED ORDER — TECHNETIUM TC 99M TETROFOSMIN IV KIT
30.0000 | PACK | Freq: Once | INTRAVENOUS | Status: AC | PRN
  Administered 2016-07-10: 32 via INTRAVENOUS

## 2016-07-10 MED ORDER — TECHNETIUM TC 99M TETROFOSMIN IV KIT
10.0000 | PACK | Freq: Once | INTRAVENOUS | Status: AC | PRN
  Administered 2016-07-10: 11 via INTRAVENOUS

## 2016-07-10 MED ORDER — SODIUM CHLORIDE 0.9% FLUSH
INTRAVENOUS | Status: AC
  Administered 2016-07-10: 10 mL via INTRAVENOUS
  Filled 2016-07-10: qty 10

## 2016-07-10 MED ORDER — REGADENOSON 0.4 MG/5ML IV SOLN
INTRAVENOUS | Status: AC
  Administered 2016-07-10: 0.4 mg via INTRAVENOUS
  Filled 2016-07-10: qty 5

## 2016-07-17 DIAGNOSIS — H35371 Puckering of macula, right eye: Secondary | ICD-10-CM | POA: Diagnosis not present

## 2016-07-17 DIAGNOSIS — H353132 Nonexudative age-related macular degeneration, bilateral, intermediate dry stage: Secondary | ICD-10-CM | POA: Diagnosis not present

## 2016-07-17 DIAGNOSIS — H35372 Puckering of macula, left eye: Secondary | ICD-10-CM | POA: Diagnosis not present

## 2016-07-17 DIAGNOSIS — H353211 Exudative age-related macular degeneration, right eye, with active choroidal neovascularization: Secondary | ICD-10-CM | POA: Diagnosis not present

## 2016-07-17 DIAGNOSIS — H3561 Retinal hemorrhage, right eye: Secondary | ICD-10-CM | POA: Diagnosis not present

## 2016-08-12 DIAGNOSIS — R079 Chest pain, unspecified: Secondary | ICD-10-CM | POA: Diagnosis not present

## 2016-08-18 ENCOUNTER — Encounter: Payer: Self-pay | Admitting: Adult Health

## 2016-08-18 ENCOUNTER — Ambulatory Visit (INDEPENDENT_AMBULATORY_CARE_PROVIDER_SITE_OTHER): Payer: PPO | Admitting: Adult Health

## 2016-08-18 ENCOUNTER — Ambulatory Visit (HOSPITAL_COMMUNITY)
Admission: RE | Admit: 2016-08-18 | Discharge: 2016-08-18 | Disposition: A | Discharge status: Home / Self Care | Payer: PPO | Source: Ambulatory Visit | Attending: Adult Health | Admitting: Adult Health

## 2016-08-18 VITALS — BP 112/56 | HR 82 | Ht 69.0 in | Wt 182.0 lb

## 2016-08-18 DIAGNOSIS — I25119 Atherosclerotic heart disease of native coronary artery with unspecified angina pectoris: Secondary | ICD-10-CM

## 2016-08-18 DIAGNOSIS — Z01818 Encounter for other preprocedural examination: Secondary | ICD-10-CM

## 2016-08-18 DIAGNOSIS — I10 Essential (primary) hypertension: Secondary | ICD-10-CM | POA: Diagnosis not present

## 2016-08-18 DIAGNOSIS — R079 Chest pain, unspecified: Secondary | ICD-10-CM | POA: Diagnosis not present

## 2016-08-18 NOTE — Patient Instructions (Addendum)
   [place] [address] Dept: [phone] Loc: [phone]  DAYNE CHAIT  08/18/2016  You are scheduled for a Cardiac Catheterization on Thursday, June 28 with Dr. Lauree Chandler.  1. Please arrive at the [facility] (Main Entrance A) at [facility]: [address] [place], [place] [address] at 5:30 am (two hours before your procedure to ensure your preparation). Free valet parking service is available.   Special note: Every effort is made to have your procedure done on time. Please understand that emergencies sometimes delay scheduled procedures.  2. Diet: Do not eat or drink anything after midnight prior to your procedure except sips of water to take medications.  3. Labs: have drawn tomorrow, chest x-ray also  4. Medication instructions in preparation for your procedure:   On the morning of your procedure, take your Aspirin and plavix any morning medicines NOT listed above.  You may use sips of water.  5. Plan for one night stay--bring personal belongings. 6. Bring a current list of your medications and current insurance cards. 7. You MUST have a responsible person to drive you home. 8. Someone MUST be with you the first 24 hours after you arrive home or your discharge will be delayed. 9. Please wear clothes that are easy to get on and off and wear slip-on shoes.  Thank you for allowing [place] to care for you!   -- [facility]

## 2016-08-18 NOTE — Progress Notes (Signed)
Cardiology Office Note   Date:  08/18/2016   ID:  [name] [name], DOB 21-Jul-1940, MRN [id number]  PCP:  [name] Squibb, MD  Cardiologist: [name] [name]  Chief Complaint  Patient presents with  . Coronary Artery Disease  . Chest Pain  . Hypertension    History of Present Illness: [name] [name] is a 76 y.o. male who presents for ongoing assessment and management of CAD, history of drug-eluting stent to the LAD and diagonal branch in 2001, hypertension. Was last seen in the office in 07/04/2016 with worsening chest pain especially during sexual activity, described as a stinging pain across his chest lasting approximately 10 minutes. Resolves after completion of sexual act.  The patient was scheduled for a stress Myoview for reevaluation for progress of CAD with recurrent symptoms    Stress MPI: 07/10/2016  Equivocal ST segment changes, no arrhythmias.  Small, moderate intensity, fixed anteroseptal defect from mid level to apex. Defect is most prominent on rest imaging raising the possibility of variable soft tissue attenuation, although this is likely consistent with scar. No large ischemic territories.  This is a low risk study.  Nuclear stress EF: 56%.  He is here with progressive symptoms. He states walking up a hill or doing anything exertional is causing him to have some chest pressure and dyspnea. Is no longer limited to sexual activity. He admits to being fairly sedentary, but he has not had exertional dyspnea or chest pain until the last week or so. His wife is also noticed him complaining of the symptoms. He has not taken any nitroglycerin for relief of symptoms. He also states that when he was on the treadmill during the stress test he had somewhat shortness of breath that they had to stop.  Past Medical History:  Diagnosis Date  . Arthritis   . Coronary atherosclerosis of native coronary artery    PTCA/stent to LAD and diagonal 2001, LVEF 55%  . CVA (cerebral vascular  accident) ([place]) 01/2011   Resolved dysarthria and right hemiparesis  . Drug intolerance    ACE inhibitor; related to cough   . Essential hypertension, benign   . Hyperlipidemia   . MI (myocardial infarction) ([place]) 2000  . Partial small bowel obstruction ([place]) 12//2012   Resolved spontaneously.    Past Surgical History:  Procedure Laterality Date  . CATARACT EXTRACTION W/PHACO Right 05/14/2015   Procedure: CATARACT EXTRACTION PHACO AND INTRAOCULAR LENS PLACEMENT RIGHT EYE CDE=6.96;  Surgeon: [name] Che, MD;  Location: [facility];  Service: Ophthalmology;  Laterality: Right;  . POLYPECTOMY  2005     Current Outpatient Prescriptions  Medication Sig Dispense Refill  . aspirin 325 MG tablet Take 325 mg by mouth daily.    . Cholecalciferol (VITAMIN D PO) Take 1 tablet by mouth daily.    . clopidogrel (PLAVIX) 75 MG tablet Take 75 mg by mouth daily.    . hydrocortisone 2.5 % cream Apply topically as needed. 30 g 0  . nitroGLYCERIN (NITROSTAT) 0.4 MG SL tablet Place 1 tablet (0.4 mg total) under the tongue every 5 (five) minutes as needed. 25 tablet 3  . polycarbophil (FIBERCON) 625 MG tablet Take 1,250 mg by mouth daily.    [name] [name] Glycol-Propyl Glycol (SYSTANE OP) Apply 1 drop to eye daily as needed (Dry Eyes).    . simvastatin (ZOCOR) 40 MG tablet Take 40 mg by mouth daily.    Marland Kitchen telmisartan (MICARDIS) 80 MG tablet Take 80 mg by mouth daily.  No current facility-administered medications for this visit.     Allergies:   Penicillins    Social History:  The patient  reports that he quit smoking about 57 years ago. His smoking use included Cigarettes. He has a 4.00 pack-year smoking history. He has never used smokeless tobacco. He reports that he does not drink alcohol or use drugs.   Family History:  The patient's family history includes Cancer in his brother, father, mother, and sister; Diabetes in his sister; Heart failure in his father; Hyperlipidemia in his father;  Hypertension in his sister.    ROS: All other systems are reviewed and negative. Unless otherwise mentioned in H&P    PHYSICAL EXAM: VS:  BP (!) 112/56   Pulse 82   Ht 5\' 9"  (1.753 m)   Wt 182 lb (82.6 kg)   SpO2 95%   BMI 26.88 kg/m  , BMI Body mass index is 26.88 kg/m. GEN: Well nourished, well developed, in no acute distress  HEENT: normal  Neck: no JVD, carotid bruits, or masses Cardiac: RRR; no murmurs, rubs, or gallops,no edema  Respiratory:  clear to auscultation bilaterally, normal work of breathing GI: soft, nontender, nondistended, + BS MS: no deformity or atrophy  Skin: warm and dry, no rash Neuro:  Strength and sensation are intact Psych: euthymic mood, full affect   Recent Labs: No results found for requested labs within last 8760 hours.    Lipid Panel    Component Value Date/Time   CHOL 158 07/14/2014 0910   TRIG 136 07/14/2014 0910   HDL 36 (L) 07/14/2014 0910   CHOLHDL 4.4 07/14/2014 0910   VLDL 27 07/14/2014 0910   LDLCALC 95 07/14/2014 0910      Wt Readings from Last 3 Encounters:  08/18/16 182 lb (82.6 kg)  07/04/16 183 lb (83 kg)  04/28/16 181 lb (82.1 kg)      Other studies Reviewed: Cardiac Catheterization 03/07/1999 PROCEDURES PERFORMED: 1. Left heart catheterization. 2. Right heart catheterization. 3. Left ventriculogram. 4. Intravascular ultrasound of the left anterior descending. 5. Percutaneous transluminal coronary angioplasty of the left anterior descending. 6. Percutaneous transluminal coronary angioplasty of the second diagonal branch of    the left anterior descending. 7. Perclose suture closure of the right femoral artery.  DIAGNOSES: 1. 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 2. Mild left ventricular dysfunction. 3. Normal right heart pressures.  Cardiac cath 01/24/2000 ANGIOGRAPHY: 1. Left main:  The left main coronary artery is normal. 2. Left anterior descending:  The left anterior  descending revealed    a 30% in-stent re-stenosis in the proximal LAD.  The diagonal revealed    70% re-stenosis at the ostium and also proximal. 3. Circumflex:  The circumflex coronary is normal. 4. Right coronary artery:  The right coronary artery revealed a focal    50-70% lesion of the distal RCA.  It is not felt to have changed    significantly.  There was no response to intracoronary nitroglycerin. 5. Left ventricle:  The left ventricle revealed well preserved ejection    fraction of 65% with slight hypokinesis of the distal anterior wall. 6. Abdominal aortogram:  The abdominal aortogram is normal.  SUMMARY:  Patient with previous anterior infarct, treated with emergent PTCA with re-stenosis dilated in January.  Also the diagonal dilated at that time. There is no re-stenosis of the LAD at this time.  The diagonal has re-stenosis.  There is a 50-70% lesion in the RCA.  The LV is normal.  Abdominal aortogram normal.  ASSESSMENT AND PLAN:  1. Coronary artery disease: Patient with known history of stent to the LAD, and diagonal branch, in 2001. He also had a 50-70% lesion in the right coronary artery at that time. He has done well since with medical management. He has had progressive symptoms of dyspnea on exertion and chest pressure. At first contain only to sexual activity, but has progressed to activity such as walking up a hill, or doing minimal exertion working in his chart.  Despite normal stress test, I believe the patient would benefit from cardiac catheterization for further evaluation of progression of CAD with known history of stents to the LAD and first diagonal with residual disease in the right coronary artery. It is been 17 years since last cardiac catheterization. He is likely that he has some form of progressive disease.  I discussed this with Dr. Harl Bowie who is on side today, as Dr. Domenic [name] is on vacation. He has reviewed the chart and is in agreement that cardiac  catheterization would be beneficial for definitive evaluation of coronary anatomy and progression of CAD.  I discussed this with the patient,The patient understands that risks include but are not limited to stroke (1 in 1000), death (1 in 88), kidney failure [usually temporary] (1 in 500), bleeding (1 in 200), allergic reaction [possibly serious] (1 in 200), and agrees to proceed.   Cardiac catheterization will be completed on 08/21/2016 at 7:30 AM. This has been explained to the patient who is willing to proceed and be at [facility] at 5:30 AM. No medications will be held. Orders and precath labs are completed planned.  2. Hypertension: Blood pressures probably well-controlled on medication regimen.   Current medicines are reviewed at length with the patient today.    Labs/ tests ordered today include: pre-cardiac catheterization orders to include labs and x-ray.  Phill Myron. West Pugh, ANP, AACC   08/18/2016 4:57 PM    [facility] [address], [place], [place] [address] Phone: [phone]; Fax: [phone]

## 2016-08-19 ENCOUNTER — Other Ambulatory Visit: Payer: Self-pay | Admitting: Adult Health

## 2016-08-19 ENCOUNTER — Other Ambulatory Visit (HOSPITAL_COMMUNITY)
Admission: RE | Admit: 2016-08-19 | Discharge: 2016-08-19 | Disposition: A | Discharge status: Home / Self Care | Payer: PPO | Source: Ambulatory Visit | Attending: Adult Health | Admitting: Adult Health

## 2016-08-19 ENCOUNTER — Telehealth: Payer: Self-pay

## 2016-08-19 ENCOUNTER — Other Ambulatory Visit: Payer: Self-pay | Admitting: Cardiology

## 2016-08-19 DIAGNOSIS — Z01818 Encounter for other preprocedural examination: Secondary | ICD-10-CM | POA: Insufficient documentation

## 2016-08-19 DIAGNOSIS — R079 Chest pain, unspecified: Secondary | ICD-10-CM | POA: Insufficient documentation

## 2016-08-19 LAB — CBC WITH DIFFERENTIAL/PLATELET
BASOS PCT: 0 %
Basophils Absolute: 0 10*3/uL (ref 0.0–0.1)
EOS ABS: 0.2 10*3/uL (ref 0.0–0.7)
Eosinophils Relative: 2 %
HEMATOCRIT: 42.3 % (ref 39.0–52.0)
HEMOGLOBIN: 14.9 g/dL (ref 13.0–17.0)
LYMPHS ABS: 2.4 10*3/uL (ref 0.7–4.0)
Lymphocytes Relative: 28 %
MCH: 30.7 pg (ref 26.0–34.0)
MCHC: 35.2 g/dL (ref 30.0–36.0)
MCV: 87 fL (ref 78.0–100.0)
MONO ABS: 0.5 10*3/uL (ref 0.1–1.0)
MONOS PCT: 6 %
NEUTROS PCT: 64 %
Neutro Abs: 5.7 10*3/uL (ref 1.7–7.7)
Platelets: 208 10*3/uL (ref 150–400)
RBC: 4.86 MIL/uL (ref 4.22–5.81)
RDW: 13.1 % (ref 11.5–15.5)
WBC: 8.8 10*3/uL (ref 4.0–10.5)

## 2016-08-19 LAB — BASIC METABOLIC PANEL
Anion gap: 9 (ref 5–15)
BUN: 19 mg/dL (ref 6–20)
CALCIUM: 9.1 mg/dL (ref 8.9–10.3)
CHLORIDE: 104 mmol/L (ref 101–111)
CO2: 27 mmol/L (ref 22–32)
CREATININE: 0.89 mg/dL (ref 0.61–1.24)
GFR calc non Af Amer: >60 mL/min (ref >60)
GLUCOSE: 134 mg/dL — AB (ref 65–99)
Potassium: 3.9 mmol/L (ref 3.5–5.1)
Sodium: 140 mmol/L (ref 135–145)

## 2016-08-19 LAB — PROTIME-INR
INR: 1.03
PROTHROMBIN TIME: 13.5 s (ref 11.4–15.2)

## 2016-08-19 MED ORDER — NITROGLYCERIN 0.4 MG SL SUBL: 0.4000 mg | SUBLINGUAL_TABLET | SUBLINGUAL | 3 refills | Status: DC | PRN

## 2016-08-19 NOTE — Telephone Encounter (Signed)
Patient contacted pre-catheterization at [facility] scheduled for: 08/21/2016 @ 0730  Verified arrival time and place: [facility] @ 0530.    Confirmed AM meds to be taken pre-cath with sip of water:  Pt states will be taking all his AM meds prior to procedure-includes ASA and plavix.   Told Pt most important to take ASA and plavix.  Confirmed patient has responsible person to drive home post procedure and observe patient for 24 hours: wife  Addl concerns:  Pt is [name].  Pt wanted to ensure he would not receive blood products.  Call placed to cath lab to notify of Pt request.

## 2016-08-19 NOTE — Addendum Note (Signed)
Addended by: Barbarann Ehlers A on: 08/19/2016 10:19 AM   Modules accepted: Orders

## 2016-08-21 ENCOUNTER — Encounter (HOSPITAL_COMMUNITY)
Admission: RE | Disposition: A | Discharge status: Home / Self Care | Payer: Self-pay | Source: Ambulatory Visit | Attending: Cardiovascular Disease

## 2016-08-21 ENCOUNTER — Ambulatory Visit (HOSPITAL_COMMUNITY)
Admission: RE | Admit: 2016-08-21 | Discharge: 2016-08-22 | Disposition: A | Discharge status: Home / Self Care | Payer: PPO | Source: Ambulatory Visit | Attending: Cardiovascular Disease | Admitting: Cardiovascular Disease

## 2016-08-21 ENCOUNTER — Encounter (HOSPITAL_COMMUNITY): Payer: Self-pay | Admitting: Cardiovascular Disease

## 2016-08-21 DIAGNOSIS — H04129 Dry eye syndrome of unspecified lacrimal gland: Secondary | ICD-10-CM | POA: Insufficient documentation

## 2016-08-21 DIAGNOSIS — Z880 Allergy status to penicillin: Secondary | ICD-10-CM | POA: Diagnosis not present

## 2016-08-21 DIAGNOSIS — I10 Essential (primary) hypertension: Secondary | ICD-10-CM | POA: Diagnosis not present

## 2016-08-21 DIAGNOSIS — I252 Old myocardial infarction: Secondary | ICD-10-CM | POA: Insufficient documentation

## 2016-08-21 DIAGNOSIS — R079 Chest pain, unspecified: Secondary | ICD-10-CM

## 2016-08-21 DIAGNOSIS — Z8673 Personal history of transient ischemic attack (TIA), and cerebral infarction without residual deficits: Secondary | ICD-10-CM | POA: Diagnosis not present

## 2016-08-21 DIAGNOSIS — I200 Unstable angina: Secondary | ICD-10-CM

## 2016-08-21 DIAGNOSIS — E785 Hyperlipidemia, unspecified: Secondary | ICD-10-CM | POA: Diagnosis not present

## 2016-08-21 DIAGNOSIS — I25110 Atherosclerotic heart disease of native coronary artery with unstable angina pectoris: Secondary | ICD-10-CM | POA: Diagnosis not present

## 2016-08-21 DIAGNOSIS — Z7902 Long term (current) use of antithrombotics/antiplatelets: Secondary | ICD-10-CM | POA: Diagnosis not present

## 2016-08-21 DIAGNOSIS — Z7982 Long term (current) use of aspirin: Secondary | ICD-10-CM | POA: Insufficient documentation

## 2016-08-21 DIAGNOSIS — Z955 Presence of coronary angioplasty implant and graft: Secondary | ICD-10-CM

## 2016-08-21 HISTORY — DX: Reserved for inherently not codable concepts without codable children: IMO0001

## 2016-08-21 HISTORY — DX: Procedure and treatment not carried out because of patient's decision for reasons of belief and group pressure: Z53.1

## 2016-08-21 HISTORY — DX: Nonexudative age-related macular degeneration, right eye, stage unspecified: H35.3110

## 2016-08-21 HISTORY — PX: CORONARY ANGIOPLASTY WITH STENT PLACEMENT: SHX49

## 2016-08-21 HISTORY — PX: LEFT HEART CATH AND CORONARY ANGIOGRAPHY: CATH118249

## 2016-08-21 HISTORY — PX: CORONARY STENT INTERVENTION: CATH118234

## 2016-08-21 LAB — POCT ACTIVATED CLOTTING TIME
Activated Clotting Time: 285 seconds
Activated Clotting Time: 285 seconds
Activated Clotting Time: 335 seconds

## 2016-08-21 SURGERY — LEFT HEART CATH AND CORONARY ANGIOGRAPHY
Anesthesia: LOCAL

## 2016-08-21 MED ORDER — HEPARIN (PORCINE) IN NACL 2-0.9 UNIT/ML-% IJ SOLN
INTRAMUSCULAR | Status: DC | PRN
  Administered 2016-08-21: 08:00:00

## 2016-08-21 MED ORDER — FENTANYL CITRATE (PF) 100 MCG/2ML IJ SOLN
INTRAMUSCULAR | Status: DC | PRN
  Administered 2016-08-21 (×2): 25 ug via INTRAVENOUS

## 2016-08-21 MED ORDER — ACETAMINOPHEN 325 MG PO TABS: 650.0000 mg | ORAL_TABLET | ORAL | Status: DC | PRN

## 2016-08-21 MED ORDER — METOPROLOL TARTRATE 25 MG PO TABS
25.0000 mg | ORAL_TABLET | Freq: Two times a day (BID) | ORAL | Status: DC
  Administered 2016-08-21 – 2016-08-22 (×2): 25 mg via ORAL
  Filled 2016-08-21 (×2): qty 1

## 2016-08-21 MED ORDER — IRBESARTAN 75 MG PO TABS
75.0000 mg | ORAL_TABLET | Freq: Every day | ORAL | Status: DC
  Filled 2016-08-21: qty 1

## 2016-08-21 MED ORDER — HEPARIN (PORCINE) IN NACL 2-0.9 UNIT/ML-% IJ SOLN
INTRAMUSCULAR | Status: AC
  Filled 2016-08-21: qty 1000

## 2016-08-21 MED ORDER — CLOPIDOGREL BISULFATE 300 MG PO TABS
ORAL_TABLET | ORAL | Status: AC
  Filled 2016-08-21: qty 1

## 2016-08-21 MED ORDER — SODIUM CHLORIDE 0.9 % IV SOLN: INTRAVENOUS | Status: AC

## 2016-08-21 MED ORDER — SODIUM CHLORIDE 0.9 % IV SOLN: 250.0000 mL | INTRAVENOUS | Status: DC | PRN

## 2016-08-21 MED ORDER — VITAMIN D 1000 UNITS PO TABS
2000.0000 [IU] | ORAL_TABLET | Freq: Every day | ORAL | Status: DC
  Administered 2016-08-21 – 2016-08-22 (×2): 2000 [IU] via ORAL
  Filled 2016-08-21 (×2): qty 2

## 2016-08-21 MED ORDER — HEPARIN SODIUM (PORCINE) 1000 UNIT/ML IJ SOLN
INTRAMUSCULAR | Status: AC
  Filled 2016-08-21: qty 1

## 2016-08-21 MED ORDER — LABETALOL HCL 5 MG/ML IV SOLN: 10.0000 mg | INTRAVENOUS | Status: AC | PRN

## 2016-08-21 MED ORDER — LIDOCAINE HCL (PF) 1 % IJ SOLN
INTRAMUSCULAR | Status: AC
  Filled 2016-08-21: qty 30

## 2016-08-21 MED ORDER — HEPARIN SODIUM (PORCINE) 1000 UNIT/ML IJ SOLN
INTRAMUSCULAR | Status: DC | PRN
  Administered 2016-08-21: 2000 [IU] via INTRAVENOUS
  Administered 2016-08-21: 4500 [IU] via INTRAVENOUS
  Administered 2016-08-21: 2000 [IU] via INTRAVENOUS
  Administered 2016-08-21: 5500 [IU] via INTRAVENOUS

## 2016-08-21 MED ORDER — SODIUM CHLORIDE 0.9% FLUSH: 3.0000 mL | Freq: Two times a day (BID) | INTRAVENOUS | Status: DC

## 2016-08-21 MED ORDER — SIMVASTATIN 40 MG PO TABS
40.0000 mg | ORAL_TABLET | Freq: Every day | ORAL | Status: DC
  Administered 2016-08-21: 40 mg via ORAL
  Filled 2016-08-21: qty 1
  Filled 2016-08-21: qty 2

## 2016-08-21 MED ORDER — IOPAMIDOL (ISOVUE-370) INJECTION 76%
INTRAVENOUS | Status: AC
  Filled 2016-08-21: qty 100

## 2016-08-21 MED ORDER — SODIUM CHLORIDE 0.9 % WEIGHT BASED INFUSION
3.0000 mL/kg/h | INTRAVENOUS | Status: DC
  Administered 2016-08-21: 3 mL/kg/h via INTRAVENOUS

## 2016-08-21 MED ORDER — SODIUM CHLORIDE 0.9% FLUSH: 3.0000 mL | INTRAVENOUS | Status: DC | PRN

## 2016-08-21 MED ORDER — SODIUM CHLORIDE 0.9% FLUSH
3.0000 mL | INTRAVENOUS | Status: DC | PRN
  Administered 2016-08-21: 3 mL via INTRAVENOUS
  Filled 2016-08-21: qty 3

## 2016-08-21 MED ORDER — MIDAZOLAM HCL 2 MG/2ML IJ SOLN
INTRAMUSCULAR | Status: AC
  Filled 2016-08-21: qty 2

## 2016-08-21 MED ORDER — HYDRALAZINE HCL 20 MG/ML IJ SOLN: 5.0000 mg | INTRAMUSCULAR | Status: AC | PRN

## 2016-08-21 MED ORDER — SODIUM CHLORIDE 0.9% FLUSH
3.0000 mL | Freq: Two times a day (BID) | INTRAVENOUS | Status: DC
  Administered 2016-08-21 – 2016-08-22 (×2): 3 mL via INTRAVENOUS

## 2016-08-21 MED ORDER — HEPARIN (PORCINE) IN NACL 2-0.9 UNIT/ML-% IJ SOLN
INTRAMUSCULAR | Status: DC | PRN
  Administered 2016-08-21: 10 mL via INTRA_ARTERIAL

## 2016-08-21 MED ORDER — ASPIRIN 81 MG PO CHEW
81.0000 mg | CHEWABLE_TABLET | Freq: Every day | ORAL | Status: DC
  Administered 2016-08-22: 81 mg via ORAL
  Filled 2016-08-21: qty 1

## 2016-08-21 MED ORDER — IOPAMIDOL (ISOVUE-370) INJECTION 76%
INTRAVENOUS | Status: AC
  Filled 2016-08-21: qty 50

## 2016-08-21 MED ORDER — SODIUM CHLORIDE 0.9 % WEIGHT BASED INFUSION: 1.0000 mL/kg/h | INTRAVENOUS | Status: DC

## 2016-08-21 MED ORDER — LIDOCAINE HCL (PF) 1 % IJ SOLN
INTRAMUSCULAR | Status: DC | PRN
  Administered 2016-08-21: 2 mL

## 2016-08-21 MED ORDER — CLOPIDOGREL BISULFATE 300 MG PO TABS
ORAL_TABLET | ORAL | Status: DC | PRN
  Administered 2016-08-21: 600 mg via ORAL

## 2016-08-21 MED ORDER — ONDANSETRON HCL 4 MG/2ML IJ SOLN: 4.0000 mg | Freq: Four times a day (QID) | INTRAMUSCULAR | Status: DC | PRN

## 2016-08-21 MED ORDER — MIDAZOLAM HCL 2 MG/2ML IJ SOLN
INTRAMUSCULAR | Status: DC | PRN
  Administered 2016-08-21 (×2): 1 mg via INTRAVENOUS

## 2016-08-21 MED ORDER — IOPAMIDOL (ISOVUE-370) INJECTION 76%
INTRAVENOUS | Status: AC
  Filled 2016-08-21: qty 125

## 2016-08-21 MED ORDER — VERAPAMIL HCL 2.5 MG/ML IV SOLN
INTRAVENOUS | Status: AC
  Filled 2016-08-21: qty 2

## 2016-08-21 MED ORDER — FENTANYL CITRATE (PF) 100 MCG/2ML IJ SOLN
INTRAMUSCULAR | Status: AC
  Filled 2016-08-21: qty 2

## 2016-08-21 MED ORDER — CLOPIDOGREL BISULFATE 75 MG PO TABS
75.0000 mg | ORAL_TABLET | Freq: Every day | ORAL | Status: DC
  Administered 2016-08-22: 09:00:00 75 mg via ORAL
  Filled 2016-08-21: qty 1

## 2016-08-21 SURGICAL SUPPLY — 32 items
BALLN SAPPHIRE 2.0X12 (BALLOONS) ×2
BALLN SAPPHIRE 2.5X12 (BALLOONS) ×2
BALLN SAPPHIRE ~~LOC~~ 2.25X15 (BALLOONS) ×2 IMPLANT
BALLN ~~LOC~~ EMERGE MR 3.25X15 (BALLOONS) ×2
BALLN ~~LOC~~ EMERGE MR 3.25X8 (BALLOONS) ×2
BALLN ~~LOC~~ EUPHORA RX 3.0X12 (BALLOONS) ×2
BALLOON SAPPHIRE 2.0X12 (BALLOONS) ×1 IMPLANT
BALLOON SAPPHIRE 2.5X12 (BALLOONS) ×1 IMPLANT
BALLOON ~~LOC~~ EMERGE MR 3.25X15 (BALLOONS) ×1 IMPLANT
BALLOON ~~LOC~~ EMERGE MR 3.25X8 (BALLOONS) ×1 IMPLANT
BALLOON ~~LOC~~ EUPHORA RX 3.0X12 (BALLOONS) ×1 IMPLANT
CATH EXPO 5FR FR4 (CATHETERS) ×2 IMPLANT
CATH INFINITI 5 FR JL3.5 (CATHETERS) ×2 IMPLANT
CATH INFINITI 5FR ANG PIGTAIL (CATHETERS) ×2 IMPLANT
CATH LAUNCHER 6FR AL1 (CATHETERS) ×1 IMPLANT
CATH VISTA GUIDE 6FR XBLAD3.5 (CATHETERS) ×2 IMPLANT
CATHETER LAUNCHER 6FR AL1 (CATHETERS) ×2
DEVICE RAD COMP TR BAND LRG (VASCULAR PRODUCTS) ×2 IMPLANT
GLIDESHEATH SLEND SS 6F .021 (SHEATH) ×2 IMPLANT
GUIDEWIRE INQWIRE 1.5J.035X260 (WIRE) ×1 IMPLANT
INQWIRE 1.5J .035X260CM (WIRE) ×2
KIT ENCORE 26 ADVANTAGE (KITS) ×2 IMPLANT
KIT HEART LEFT (KITS) ×2 IMPLANT
PACK CARDIAC CATHETERIZATION (CUSTOM PROCEDURE TRAY) ×2 IMPLANT
STENT RESOLUTE ONYX 2.25X18 (Permanent Stent) ×2 IMPLANT
STENT RESOLUTE ONYX 3.0X12 (Permanent Stent) ×2 IMPLANT
STENT RESOLUTE ONYX 3.0X15 (Permanent Stent) ×2 IMPLANT
STENT RESOLUTE ONYX 3.0X22 (Permanent Stent) ×2 IMPLANT
SYR MEDRAD MARK V 150ML (SYRINGE) ×2 IMPLANT
TRANSDUCER W/STOPCOCK (MISCELLANEOUS) ×2 IMPLANT
TUBING CIL FLEX 10 FLL-RA (TUBING) ×2 IMPLANT
WIRE COUGAR XT STRL 190CM (WIRE) ×2 IMPLANT

## 2016-08-21 NOTE — H&P (View-Only) (Signed)
Cardiology Office Note   Date:  08/18/2016   ID:  [name] [name], DOB Aug 21, 1940, MRN [id number]  PCP:  Celene Squibb, MD  Cardiologist: Domenic Polite  Chief Complaint  Patient presents with  . Coronary Artery Disease  . Chest Pain  . Hypertension    History of Present Illness: [name] [name] is a 76 y.o. male who presents for ongoing assessment and management of CAD, history of drug-eluting stent to the LAD and diagonal branch in 2001, hypertension. Was last seen in the office in 07/04/2016 with worsening chest pain especially during sexual activity, described as a stinging pain across his chest lasting approximately 10 minutes. Resolves after completion of sexual act.  The patient was scheduled for a stress Myoview for reevaluation for progress of CAD with recurrent symptoms    Stress MPI: 07/10/2016  Equivocal ST segment changes, no arrhythmias.  Small, moderate intensity, fixed anteroseptal defect from mid level to apex. Defect is most prominent on rest imaging raising the possibility of variable soft tissue attenuation, although this is likely consistent with scar. No large ischemic territories.  This is a low risk study.  Nuclear stress EF: 56%.  He is here with progressive symptoms. He states walking up a hill or doing anything exertional is causing him to have some chest pressure and dyspnea. Is no longer limited to sexual activity. He admits to being fairly sedentary, but he has not had exertional dyspnea or chest pain until the last week or so. His wife is also noticed him complaining of the symptoms. He has not taken any nitroglycerin for relief of symptoms. He also states that when he was on the treadmill during the stress test he had somewhat shortness of breath that they had to stop.  Past Medical History:  Diagnosis Date  . Arthritis   . Coronary atherosclerosis of native coronary artery    PTCA/stent to LAD and diagonal 2001, LVEF 55%  . CVA (cerebral vascular  accident) ([place]) 01/2011   Resolved dysarthria and right hemiparesis  . Drug intolerance    ACE inhibitor; related to cough   . Essential hypertension, benign   . Hyperlipidemia   . MI (myocardial infarction) ([place]) 2000  . Partial small bowel obstruction ([place]) 12//2012   Resolved spontaneously.    Past Surgical History:  Procedure Laterality Date  . CATARACT EXTRACTION W/PHACO Right 05/14/2015   Procedure: CATARACT EXTRACTION PHACO AND INTRAOCULAR LENS PLACEMENT RIGHT EYE CDE=6.96;  Surgeon: Williams Che, MD;  Location: [facility];  Service: Ophthalmology;  Laterality: Right;  . POLYPECTOMY  2005     Current Outpatient Prescriptions  Medication Sig Dispense Refill  . aspirin 325 MG tablet Take 325 mg by mouth daily.    . Cholecalciferol (VITAMIN D PO) Take 1 tablet by mouth daily.    . clopidogrel (PLAVIX) 75 MG tablet Take 75 mg by mouth daily.    . hydrocortisone 2.5 % cream Apply topically as needed. 30 g 0  . nitroGLYCERIN (NITROSTAT) 0.4 MG SL tablet Place 1 tablet (0.4 mg total) under the tongue every 5 (five) minutes as needed. 25 tablet 3  . polycarbophil (FIBERCON) 625 MG tablet Take 1,250 mg by mouth daily.    [name] Glycol-Propyl Glycol (SYSTANE OP) Apply 1 drop to eye daily as needed (Dry Eyes).    . simvastatin (ZOCOR) 40 MG tablet Take 40 mg by mouth daily.    Marland Kitchen telmisartan (MICARDIS) 80 MG tablet Take 80 mg by mouth daily.  No current facility-administered medications for this visit.     Allergies:   Penicillins    Social History:  The patient  reports that he quit smoking about 57 years ago. His smoking use included Cigarettes. He has a 4.00 pack-year smoking history. He has never used smokeless tobacco. He reports that he does not drink alcohol or use drugs.   Family History:  The patient's family history includes Cancer in his brother, father, mother, and sister; Diabetes in his sister; Heart failure in his father; Hyperlipidemia in his father;  Hypertension in his sister.    ROS: All other systems are reviewed and negative. Unless otherwise mentioned in H&P    PHYSICAL EXAM: VS:  BP (!) 112/56   Pulse 82   Ht 5\' 9"  (1.753 m)   Wt 182 lb (82.6 kg)   SpO2 95%   BMI 26.88 kg/m  , BMI Body mass index is 26.88 kg/m. GEN: Well nourished, well developed, in no acute distress  HEENT: normal  Neck: no JVD, carotid bruits, or masses Cardiac: RRR; no murmurs, rubs, or gallops,no edema  Respiratory:  clear to auscultation bilaterally, normal work of breathing GI: soft, nontender, nondistended, + BS MS: no deformity or atrophy  Skin: warm and dry, no rash Neuro:  Strength and sensation are intact Psych: euthymic mood, full affect   Recent Labs: No results found for requested labs within last 8760 hours.    Lipid Panel    Component Value Date/Time   CHOL 158 07/14/2014 0910   TRIG 136 07/14/2014 0910   HDL 36 (L) 07/14/2014 0910   CHOLHDL 4.4 07/14/2014 0910   VLDL 27 07/14/2014 0910   LDLCALC 95 07/14/2014 0910      Wt Readings from Last 3 Encounters:  08/18/16 182 lb (82.6 kg)  07/04/16 183 lb (83 kg)  04/28/16 181 lb (82.1 kg)      Other studies Reviewed: Cardiac Catheterization 03/07/1999 PROCEDURES PERFORMED: 1. Left heart catheterization. 2. Right heart catheterization. 3. Left ventriculogram. 4. Intravascular ultrasound of the left anterior descending. 5. Percutaneous transluminal coronary angioplasty of the left anterior descending. 6. Percutaneous transluminal coronary angioplasty of the second diagonal branch of    the left anterior descending. 7. Perclose suture closure of the right femoral artery.  DIAGNOSES: 1. 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 2. Mild left ventricular dysfunction. 3. Normal right heart pressures.  Cardiac cath 01/24/2000 ANGIOGRAPHY: 1. Left main:  The left main coronary artery is normal. 2. Left anterior descending:  The left anterior  descending revealed    a 30% in-stent re-stenosis in the proximal LAD.  The diagonal revealed    70% re-stenosis at the ostium and also proximal. 3. Circumflex:  The circumflex coronary is normal. 4. Right coronary artery:  The right coronary artery revealed a focal    50-70% lesion of the distal RCA.  It is not felt to have changed    significantly.  There was no response to intracoronary nitroglycerin. 5. Left ventricle:  The left ventricle revealed well preserved ejection    fraction of 65% with slight hypokinesis of the distal anterior wall. 6. Abdominal aortogram:  The abdominal aortogram is normal.  SUMMARY:  Patient with previous anterior infarct, treated with emergent PTCA with re-stenosis dilated in January.  Also the diagonal dilated at that time. There is no re-stenosis of the LAD at this time.  The diagonal has re-stenosis.  There is a 50-70% lesion in the RCA.  The LV is normal.  Abdominal aortogram normal.  ASSESSMENT AND PLAN:  1. Coronary artery disease: Patient with known history of stent to the LAD, and diagonal branch, in 2001. He also had a 50-70% lesion in the right coronary artery at that time. He has done well since with medical management. He has had progressive symptoms of dyspnea on exertion and chest pressure. At first contain only to sexual activity, but has progressed to activity such as walking up a hill, or doing minimal exertion working in his chart.  Despite normal stress test, I believe the patient would benefit from cardiac catheterization for further evaluation of progression of CAD with known history of stents to the LAD and first diagonal with residual disease in the right coronary artery. It is been 17 years since last cardiac catheterization. He is likely that he has some form of progressive disease.  I discussed this with Dr. Harl Bowie who is on side today, as Dr. Domenic Polite is on vacation. He has reviewed the chart and is in agreement that cardiac  catheterization would be beneficial for definitive evaluation of coronary anatomy and progression of CAD.  I discussed this with the patient,The patient understands that risks include but are not limited to stroke (1 in 1000), death (1 in 13), kidney failure [usually temporary] (1 in 500), bleeding (1 in 200), allergic reaction [possibly serious] (1 in 200), and agrees to proceed.   Cardiac catheterization will be completed on 08/21/2016 at 7:30 AM. This has been explained to the patient who is willing to proceed and [facility] AM. No medications will be held. Orders and precath labs are completed planned.  2. Hypertension: Blood pressures probably well-controlled on medication regimen.   Current medicines are reviewed at length with the patient today.    Labs/ tests ordered today include: pre-cardiac catheterization orders to include labs and x-ray.  Phill Myron. West Pugh, ANP, AACC   08/18/2016 4:57 PM    [facility] [address], [place], [place] [address] Phone: [phone]; Fax: [phone]

## 2016-08-21 NOTE — Discharge Summary (Signed)
Discharge Summary    Patient ID: TAD FANCHER,  MRN: [id number], DOB/AGE: 78-Sep-1942 78 y.o.  Admit date: 08/21/2016 Discharge date: 08/22/2016  Primary Care Provider: Celene Squibb Primary Cardiologist: Domenic Polite   Discharge Diagnoses    Active Problems:   Unstable angina [facility])  CAD  HTN HLD Allergies Allergies  Allergen Reactions  . Penicillins Nausea Only    Has patient had a PCN reaction causing immediate rash, facial/tongue/throat swelling, SOB or lightheadedness with hypotension: No Has patient had a PCN reaction causing severe rash involving mucus membranes or skin necrosis: No Has patient had a PCN reaction that required hospitalization No Has patient had a PCN reaction occurring within the last 10 years: No If all of the above answers are "NO", then may proceed with Cephalosporin use.     Diagnostic Studies/Procedures    LHC: 08/21/16  Conclusion     Mid RCA to Dist RCA lesion, 10 %stenosed.  Post intervention, there is a 0% residual stenosis.  A STENT RESOLUTE ONYX 3.0X22 drug eluting stent was successfully placed, and overlaps previously placed stent.  Prox LAD lesion, 90 %stenosed.  Post intervention, there is a 0% residual stenosis.  A STENT RESOLUTE ONYX 2.83T51 drug eluting stent was successfully placed.  Ost 1st Mrg to 1st Mrg lesion, 99 %stenosed.  Post intervention, there is a 0% residual stenosis.  A STENT RESOLUTE ONYX 3.0X12 drug eluting stent was successfully placed.  Ost Cx to Prox Cx lesion, 99 %stenosed.  Post intervention, there is a 0% residual stenosis.  Mid RCA lesion, 99 %stenosed.  Post intervention, there is a 0% residual stenosis.  A STENT RESOLUTE ONYX 3.0X15 drug eluting stent was successfully placed, and overlaps previously placed stent.  The left ventricular systolic function is normal.  LV end diastolic pressure is normal.  The left ventricular ejection fraction is 55-65% by visual estimate.  There is no  mitral valve regurgitation.  Ost 1st Diag to 1st Diag lesion, 60 %stenosed.  RPDA lesion, 30 %stenosed.  Prox RCA lesion, 20 %stenosed.   1. Severe triple vessel CAD 2. Successful PTCA/DES x 1 distal RCA 3. Successful PTCA/DES x 1 proximal LAD 4. Successful PTCA/DES x 2 proximal Circumflex/OM1 5. Normal LV systolic function  Recommendations: DAPT with ASA and Plavix for lifetime. Start beta blocker at low dose. Continue statin.    _____________   History of Present Illness     76 y.o. male who presented to the office with Jory Sims for ongoing assessment and management of CAD, history of drug-eluting stent to the LAD and diagonal branch in 2001, hypertension. Was last seen in the office in 07/04/2016 with worsening chest pain especially during sexual activity, described as a stinging pain across his chest lasting approximately 10 minutes. Resolves after completion of sexual act.  The patient was scheduled for a stress Myoview for reevaluation for progress of CAD with recurrent symptoms   Stress MPI: 07/10/2016  Equivocal ST segment changes, no arrhythmias.  Small, moderate intensity, fixed anteroseptal defect from mid level to apex. Defect is most prominent on rest imaging raising the possibility of variable soft tissue attenuation, although this is likely consistent with scar. No large ischemic territories.  This is a low risk study.  Nuclear stress EF: 56%.  Hereported progressive symptoms at the office visit on 08/18/16. He stated walking up a hill or doing anything exertional is causing him to have some chest pressure and dyspnea. He admited to being fairly sedentary, but he  had not had exertional dyspnea or chest pain until the last week or so. His wife  also noticed him complaining of the symptoms. He had not taken any nitroglycerin for relief of symptoms. He also stated that when he was on the treadmill during the stress test he had somewhat shortness of breath that  they had to stop.  Hospital Course     Consultants: None  1. CAD - Cath showed severe 3V disease as above. S/p Successful PTCA and DES RCA x 1, pLAD x 1 and pCircumflex/OM 1 X 2.  - DAPT with ASA and plavix indefinite. Continue BB and statin. Ambulated well. CRP II at [facility].   2. HTN - Stable and well controlled on current regimen.   3. HLD -Continue statin. Managed by PCP. He will bring labs during follow up. LDL goal less than 70.   Discharge Vitals Blood pressure 127/75, pulse 73, temperature 98.4 F (36.9 C), temperature source Oral, resp. rate 17, height 5\' 9"  (1.753 m), weight 181 lb 3.5 oz (82.2 kg), SpO2 95 %.  Filed Weights   08/21/16 0538 08/22/16 0241  Weight: 185 lb (83.9 kg) 181 lb 3.5 oz (82.2 kg)    Labs & Radiologic Studies    CBC  Recent Labs  08/19/16 1042 08/22/16 0346  WBC 8.8 8.9  NEUTROABS 5.7  --   HGB 14.9 13.2  HCT 42.3 38.4*  MCV 87.0 86.9  PLT 208 161   Basic Metabolic Panel  Recent Labs  08/19/16 1042 08/22/16 0346  NA 140 138  K 3.9 3.7  CL 104 105  CO2 27 26  GLUCOSE 134* 125*  BUN 19 10  CREATININE 0.89 0.87  CALCIUM 9.1 8.8*   L _____________  Dg Chest 2 View  Result Date: 08/18/2016 CLINICAL DATA:  Chest pain history of MI EXAM: CHEST  2 VIEW COMPARISON:  07/14/2014 FINDINGS: The heart size and mediastinal contours are within normal limits. Both lungs are clear. Degenerative changes of the spine. IMPRESSION: No active cardiopulmonary disease. Electronically Signed   By: Donavan Foil M.D.   On: 08/18/2016 22:49   Disposition   Pt is being discharged home today in good condition.  Follow-up Plans & Appointments    Follow-up Information    Satira Sark, MD. Go on 09/15/2016.   Specialty:  Cardiology Why:  @1 :20 for post hospital  Contact information: [place] [phone]          Discharge Instructions    Amb Referral to Cardiac Rehabilitation    Complete by:  As  directed    Diagnosis:   Coronary Stents PTCA     Diet - low sodium heart healthy    Complete by:  As directed    Discharge instructions    Complete by:  As directed    No driving for 48 hours. No lifting over 5 lbs for 1 week. No sexual activity for 1 week. Keep procedure site clean & dry. If you notice increased pain, swelling, bleeding or pus, call/return!  You may shower, but no soaking baths/hot tubs/pools for 1 week.   Increase activity slowly    Complete by:  As directed       Discharge Medications   Current Discharge Medication List    START taking these medications   Details  metoprolol tartrate (LOPRESSOR) 25 MG tablet Take 1 tablet (25 mg total) by mouth 2 (two) times daily. Qty: 60 tablet, Refills: 11  CONTINUE these medications which have CHANGED   Details  aspirin 81 MG tablet Take 1 tablet (81 mg total) by mouth daily. Qty: 30 tablet, Refills: 11      CONTINUE these medications which have NOT CHANGED   Details  Cholecalciferol (VITAMIN D3) 2000 units TABS Take 2,000 Units by mouth daily.    clopidogrel (PLAVIX) 75 MG tablet Take 75 mg by mouth daily.    polycarbophil (FIBERCON) 625 MG tablet Take 1,250 mg by mouth daily.    Polyethyl Glycol-Propyl Glycol (SYSTANE OP) Place 1 drop into both eyes 3 (three) times daily as needed (Dry Eyes).     simvastatin (ZOCOR) 40 MG tablet Take 40 mg by mouth daily.    telmisartan (MICARDIS) 80 MG tablet Take 80 mg by mouth daily.     hydrocortisone 2.5 % cream Apply topically as needed. Qty: 30 g, Refills: 0    !! nitroGLYCERIN (NITROSTAT) 0.4 MG SL tablet Place 1 tablet (0.4 mg total) under the tongue every 5 (five) minutes as needed. Qty: 25 tablet, Refills: 3    !! nitroGLYCERIN (NITROSTAT) 0.4 MG SL tablet Place 1 tablet (0.4 mg total) under the tongue every 5 (five) minutes as needed. Qty: 25 tablet, Refills: 3    UNABLE TO FIND 1 Syringe by Intravitreal route See admin instructions. Patient receives eye  injections every 6 to 8 weeks.     !! - Potential duplicate medications found. Please discuss with provider.         Outstanding Labs/Studies   None   Duration of Discharge Encounter   Greater than 30 minutes including physician time.  Signed, Reino Bellis NP-C 08/22/2016, 9:18 AM

## 2016-08-21 NOTE — Interval H&P Note (Signed)
History and Physical Interval Note:  08/21/2016 7:31 AM  [name]  has presented today for cardiac cath with the diagnosis of unstable angina. The various methods of treatment have been discussed with the patient and family. After consideration of risks, benefits and other options for treatment, the patient has consented to  Procedure(s): Left Heart Cath and Coronary Angiography (N/A) as a surgical intervention .  The patient's history has been reviewed, patient examined, no change in status, stable for surgery.  I have reviewed the patient's chart and labs.  Questions were answered to the patient's satisfaction.    Cath Lab Visit (complete for each Cath Lab visit)  Clinical Evaluation Leading to the Procedure:   ACS: No.  Non-ACS:    Anginal Classification: CCS III  Anti-ischemic medical therapy: No Therapy  Non-Invasive Test Results: Low-risk stress test findings: cardiac mortality <1%/year  Prior CABG: No previous CABG        Lauree Chandler

## 2016-08-21 NOTE — Progress Notes (Signed)
Sitting up on side of bed eating [place] sandwich. Wife in room.

## 2016-08-21 NOTE — Progress Notes (Signed)
TR BAND REMOVAL  LOCATION:    right radial  DEFLATED PER PROTOCOL:    Yes.    TIME BAND OFF / DRESSING APPLIED:    1630   SITE UPON ARRIVAL:    Level 0  SITE AFTER BAND REMOVAL:    Level 0  CIRCULATION SENSATION AND MOVEMENT:    Within Normal Limits   Yes.    COMMENTS:   No change in assessment when checked during shift

## 2016-08-22 DIAGNOSIS — I200 Unstable angina: Secondary | ICD-10-CM | POA: Diagnosis not present

## 2016-08-22 DIAGNOSIS — Z880 Allergy status to penicillin: Secondary | ICD-10-CM | POA: Diagnosis not present

## 2016-08-22 DIAGNOSIS — E785 Hyperlipidemia, unspecified: Secondary | ICD-10-CM | POA: Diagnosis not present

## 2016-08-22 DIAGNOSIS — H04129 Dry eye syndrome of unspecified lacrimal gland: Secondary | ICD-10-CM | POA: Diagnosis not present

## 2016-08-22 DIAGNOSIS — Z7902 Long term (current) use of antithrombotics/antiplatelets: Secondary | ICD-10-CM | POA: Diagnosis not present

## 2016-08-22 DIAGNOSIS — Z8673 Personal history of transient ischemic attack (TIA), and cerebral infarction without residual deficits: Secondary | ICD-10-CM | POA: Diagnosis not present

## 2016-08-22 DIAGNOSIS — Z7982 Long term (current) use of aspirin: Secondary | ICD-10-CM | POA: Diagnosis not present

## 2016-08-22 DIAGNOSIS — I252 Old myocardial infarction: Secondary | ICD-10-CM | POA: Diagnosis not present

## 2016-08-22 DIAGNOSIS — I25110 Atherosclerotic heart disease of native coronary artery with unstable angina pectoris: Secondary | ICD-10-CM | POA: Diagnosis not present

## 2016-08-22 DIAGNOSIS — I10 Essential (primary) hypertension: Secondary | ICD-10-CM | POA: Diagnosis not present

## 2016-08-22 LAB — CBC
HCT: 38.4 % — ABNORMAL LOW (ref 39.0–52.0)
HEMOGLOBIN: 13.2 g/dL (ref 13.0–17.0)
MCH: 29.9 pg (ref 26.0–34.0)
MCHC: 34.4 g/dL (ref 30.0–36.0)
MCV: 86.9 fL (ref 78.0–100.0)
PLATELETS: 187 10*3/uL (ref 150–400)
RBC: 4.42 MIL/uL (ref 4.22–5.81)
RDW: 13.1 % (ref 11.5–15.5)
WBC: 8.9 10*3/uL (ref 4.0–10.5)

## 2016-08-22 LAB — BASIC METABOLIC PANEL
ANION GAP: 7 (ref 5–15)
BUN: 10 mg/dL (ref 6–20)
CALCIUM: 8.8 mg/dL — AB (ref 8.9–10.3)
CO2: 26 mmol/L (ref 22–32)
CREATININE: 0.87 mg/dL (ref 0.61–1.24)
Chloride: 105 mmol/L (ref 101–111)
GFR calc Af Amer: >60 mL/min (ref >60)
GLUCOSE: 125 mg/dL — AB (ref 65–99)
Potassium: 3.7 mmol/L (ref 3.5–5.1)
Sodium: 138 mmol/L (ref 135–145)

## 2016-08-22 MED ORDER — ANGIOPLASTY BOOK
Freq: Once | Status: DC
  Filled 2016-08-22: qty 1

## 2016-08-22 MED ORDER — METOPROLOL TARTRATE 25 MG PO TABS: 25.0000 mg | ORAL_TABLET | Freq: Two times a day (BID) | ORAL | 11 refills | Status: DC

## 2016-08-22 MED ORDER — ASPIRIN 81 MG PO TABS: 81.0000 mg | ORAL_TABLET | Freq: Every day | ORAL | 11 refills | Status: AC

## 2016-08-22 NOTE — Progress Notes (Signed)
Progress Note  Patient Name: [name] [name] Date of Encounter: 08/22/2016  Primary Cardiologist: Dr. Domenic Polite  Subjective   Feeling well. No chest pain, sob or palpitations. Ambulated well.   Inpatient Medications    Scheduled Meds: . angioplasty book   Does not apply Once  . aspirin  81 mg Oral Daily  . cholecalciferol  2,000 Units Oral Daily  . clopidogrel  75 mg Oral Daily  . irbesartan  75 mg Oral Daily  . metoprolol tartrate  25 mg Oral BID  . simvastatin  40 mg Oral q1800  . sodium chloride flush  3 mL Intravenous Q12H   Continuous Infusions: . sodium chloride     PRN Meds: sodium chloride, acetaminophen, ondansetron (ZOFRAN) IV, sodium chloride flush   Vital Signs    Vitals:   08/21/16 1914 08/21/16 2135 08/22/16 0241 08/22/16 0650  BP: 121/70  140/66 (!) 130/59  Pulse: 64 (!) 59 67 79  Resp: (!) 26  17 (!) 30  Temp: 98.4 F (36.9 C)  98.2 F (36.8 C)   TempSrc: Oral  Oral   SpO2: 100% 99% 97% 98%  Weight:   181 lb 3.5 oz (82.2 kg)   Height:        Intake/Output Summary (Last 24 hours) at 08/22/16 0736 Last data filed at 08/22/16 0653  Gross per 24 hour  Intake              123 ml  Output             1400 ml  Net            -1277 ml   Filed Weights   08/21/16 0538 08/22/16 0241  Weight: 185 lb (83.9 kg) 181 lb 3.5 oz (82.2 kg)    Telemetry    NSR with NSVT x 1 for 3 beats - Personally Reviewed  ECG    NSR with TWI in lead V3 - Personally Reviewed  Physical Exam   GEN: No acute distress.   Neck: No JVD Cardiac: RRR, no murmurs, rubs, or gallops. R radial cath site without hematoma.  Respiratory: Clear to auscultation bilaterally. GI: Soft, nontender, non-distended  MS: No edema; No deformity. Neuro:  Nonfocal  Psych: Normal affect   Labs    Chemistry Recent Labs Lab 08/19/16 1042 08/22/16 0346  NA 140 138  K 3.9 3.7  CL 104 105  CO2 27 26  GLUCOSE 134* 125*  BUN 19 10  CREATININE 0.89 0.87  CALCIUM 9.1 8.8*  GFRNONAA  >60 >60  GFRAA >60 >60  ANIONGAP 9 7     Hematology Recent Labs Lab 08/19/16 1042 08/22/16 0346  WBC 8.8 8.9  RBC 4.86 4.42  HGB 14.9 13.2  HCT 42.3 38.4*  MCV 87.0 86.9  MCH 30.7 29.9  MCHC 35.2 34.4  RDW 13.1 13.1  PLT 208 187    Cardiac EnzymesNo results for input(s): TROPONINI in the last 168 hours. No results for input(s): TROPIPOC in the last 168 hours.   BNPNo results for input(s): BNP, PROBNP in the last 168 hours.   DDimer No results for input(s): DDIMER in the last 168 hours.   Radiology    No results found.  Cardiac Studies   Cath 08/21/16 Coronary Stent Intervention  Left Heart Cath and Coronary Angiography  Conclusion     Mid RCA to Dist RCA lesion, 10 %stenosed.  Post intervention, there is a 0% residual stenosis.  A STENT RESOLUTE ONYX 3.0X22 drug eluting  stent was successfully placed, and overlaps previously placed stent.  Prox LAD lesion, 90 %stenosed.  Post intervention, there is a 0% residual stenosis.  A STENT RESOLUTE ONYX 5.00B70 drug eluting stent was successfully placed.  Ost 1st Mrg to 1st Mrg lesion, 99 %stenosed.  Post intervention, there is a 0% residual stenosis.  A STENT RESOLUTE ONYX 3.0X12 drug eluting stent was successfully placed.  Ost Cx to Prox Cx lesion, 99 %stenosed.  Post intervention, there is a 0% residual stenosis.  Mid RCA lesion, 99 %stenosed.  Post intervention, there is a 0% residual stenosis.  A STENT RESOLUTE ONYX 3.0X15 drug eluting stent was successfully placed, and overlaps previously placed stent.  The left ventricular systolic function is normal.  LV end diastolic pressure is normal.  The left ventricular ejection fraction is 55-65% by visual estimate.  There is no mitral valve regurgitation.  Ost 1st Diag to 1st Diag lesion, 60 %stenosed.  RPDA lesion, 30 %stenosed.  Prox RCA lesion, 20 %stenosed.   1. Severe triple vessel CAD 2. Successful PTCA/DES x 1 distal RCA 3. Successful  PTCA/DES x 1 proximal LAD 4. Successful PTCA/DES x 2 proximal Circumflex/OM1 5. Normal LV systolic function  Recommendations: DAPT with ASA and Plavix for lifetime. Start beta blocker at low dose. Continue statin.     Diagnostic Diagram       Post-Intervention Diagram           Patient Profile     76 y.o. male with hx of CAD, HTN and HLD presented for scheduled cath.    Assessment & Plan    1. CAD - Cath showed severe 3V disease as above. S/p Successful PTCA and DES RCA x 1, pLAD x 1 and pCircumflex/OM 1 X 2.  - DAPT with ASA and plavix indefinite. Continue BB and statin. Ambulated well.   2. HTN - Stable and well controlled on current regimen.   3. HLD -Continue statin. Managed by PCP. He will bring labs during follow up. LDL goal less than 70.  Jarrett Soho, PA  08/22/2016, 7:36 AM    Patient seen, examined. Available data reviewed. Agree with findings, assessment, and plan as outlined by Robbie Lis, PA-C. Pt sitting at bedside ready for DC. No CP or dyspnea. Right radial site clear - bandage removed. Heart RRR no murmur, lungs CTA. Post-PCI restrictions reviewed. FU with Dr Domenic Polite arranged.  Sherren Mocha, M.D. 08/22/2016 9:00 AM

## 2016-08-22 NOTE — Progress Notes (Signed)
CARDIAC REHAB PHASE I   PRE:  Rate/Rhythm: 68 SR    BP: sitting 127/752    SaO2:   MODE:  Ambulation: 640 ft   POST:  Rate/Rhythm: 78 SR    BP: sitting 136/73     SaO2:   Tolerated well, no c/o. Ed completed with pt and wife. Voiced understanding, knows importance of Plavix/ASA, will send referral to [place].  [place], ACSM 08/22/2016 8:31 AM

## 2016-08-28 DIAGNOSIS — H35372 Puckering of macula, left eye: Secondary | ICD-10-CM | POA: Diagnosis not present

## 2016-08-28 DIAGNOSIS — H353112 Nonexudative age-related macular degeneration, right eye, intermediate dry stage: Secondary | ICD-10-CM | POA: Diagnosis not present

## 2016-08-28 DIAGNOSIS — H353211 Exudative age-related macular degeneration, right eye, with active choroidal neovascularization: Secondary | ICD-10-CM | POA: Diagnosis not present

## 2016-08-28 DIAGNOSIS — H33102 Unspecified retinoschisis, left eye: Secondary | ICD-10-CM | POA: Diagnosis not present

## 2016-08-28 DIAGNOSIS — H35371 Puckering of macula, right eye: Secondary | ICD-10-CM | POA: Diagnosis not present

## 2016-08-31 ENCOUNTER — Emergency Department (HOSPITAL_COMMUNITY)
Admission: EM | Admit: 2016-08-31 | Discharge: 2016-08-31 | Disposition: A | Discharge status: Home / Self Care | Payer: PPO | Attending: Emergency Medicine | Admitting: Emergency Medicine

## 2016-08-31 ENCOUNTER — Encounter (HOSPITAL_COMMUNITY): Payer: Self-pay | Admitting: Emergency Medicine

## 2016-08-31 DIAGNOSIS — Z7982 Long term (current) use of aspirin: Secondary | ICD-10-CM | POA: Diagnosis not present

## 2016-08-31 DIAGNOSIS — Z79899 Other long term (current) drug therapy: Secondary | ICD-10-CM | POA: Insufficient documentation

## 2016-08-31 DIAGNOSIS — I219 Acute myocardial infarction, unspecified: Secondary | ICD-10-CM | POA: Diagnosis not present

## 2016-08-31 DIAGNOSIS — I25759 Atherosclerosis of native coronary artery of transplanted heart with unspecified angina pectoris: Secondary | ICD-10-CM | POA: Insufficient documentation

## 2016-08-31 DIAGNOSIS — I10 Essential (primary) hypertension: Secondary | ICD-10-CM | POA: Diagnosis not present

## 2016-08-31 DIAGNOSIS — Z7902 Long term (current) use of antithrombotics/antiplatelets: Secondary | ICD-10-CM | POA: Diagnosis not present

## 2016-08-31 DIAGNOSIS — R42 Dizziness and giddiness: Secondary | ICD-10-CM

## 2016-08-31 DIAGNOSIS — H8149 Vertigo of central origin, unspecified ear: Secondary | ICD-10-CM | POA: Diagnosis not present

## 2016-08-31 DIAGNOSIS — Z87891 Personal history of nicotine dependence: Secondary | ICD-10-CM | POA: Diagnosis not present

## 2016-08-31 LAB — CBC WITH DIFFERENTIAL/PLATELET
Basophils Absolute: 0 10*3/uL (ref 0.0–0.1)
Basophils Relative: 0 %
Eosinophils Absolute: 0.2 10*3/uL (ref 0.0–0.7)
Eosinophils Relative: 2 %
HEMATOCRIT: 39.1 % (ref 39.0–52.0)
HEMOGLOBIN: 13.9 g/dL (ref 13.0–17.0)
LYMPHS ABS: 2 10*3/uL (ref 0.7–4.0)
LYMPHS PCT: 18 %
MCH: 30.8 pg (ref 26.0–34.0)
MCHC: 35.5 g/dL (ref 30.0–36.0)
MCV: 86.7 fL (ref 78.0–100.0)
MONOS PCT: 6 %
Monocytes Absolute: 0.7 10*3/uL (ref 0.1–1.0)
NEUTROS ABS: 8.5 10*3/uL — AB (ref 1.7–7.7)
NEUTROS PCT: 74 %
Platelets: 219 10*3/uL (ref 150–400)
RBC: 4.51 MIL/uL (ref 4.22–5.81)
RDW: 12.7 % (ref 11.5–15.5)
WBC: 11.3 10*3/uL — ABNORMAL HIGH (ref 4.0–10.5)

## 2016-08-31 LAB — COMPREHENSIVE METABOLIC PANEL
ALBUMIN: 4.3 g/dL (ref 3.5–5.0)
ALK PHOS: 86 U/L (ref 38–126)
ALT: 13 U/L — ABNORMAL LOW (ref 17–63)
ANION GAP: 11 (ref 5–15)
AST: 16 U/L (ref 15–41)
BILIRUBIN TOTAL: 1.1 mg/dL (ref 0.3–1.2)
BUN: 19 mg/dL (ref 6–20)
CALCIUM: 9.1 mg/dL (ref 8.9–10.3)
CO2: 26 mmol/L (ref 22–32)
Chloride: 106 mmol/L (ref 101–111)
Creatinine, Ser: 0.92 mg/dL (ref 0.61–1.24)
GFR calc non Af Amer: >60 mL/min (ref >60)
GLUCOSE: 125 mg/dL — AB (ref 65–99)
Potassium: 4.1 mmol/L (ref 3.5–5.1)
Sodium: 143 mmol/L (ref 135–145)
TOTAL PROTEIN: 7.4 g/dL (ref 6.5–8.1)

## 2016-08-31 LAB — TROPONIN I: Troponin I: <0.03 ng/mL (ref <0.03)

## 2016-08-31 MED ORDER — MECLIZINE HCL 12.5 MG PO TABS: 12.5000 mg | ORAL_TABLET | Freq: Three times a day (TID) | ORAL | 0 refills | Status: DC | PRN

## 2016-08-31 MED ORDER — MECLIZINE HCL 12.5 MG PO TABS
25.0000 mg | ORAL_TABLET | Freq: Once | ORAL | Status: AC
  Administered 2016-08-31: 25 mg via ORAL
  Filled 2016-08-31: qty 2

## 2016-08-31 NOTE — Discharge Instructions (Signed)

## 2016-08-31 NOTE — ED Provider Notes (Signed)
[facility] DEPT Provider Note   CSN: [id number] Arrival date & time: 08/31/16  1932     History   Chief Complaint Chief Complaint  Patient presents with  . Dizziness    HPI [name] [name] is a 76 y.o. male.  HPI  76 y/o male - recently had cardiac stents placed who prsents todcay with acute onset of vertigo - states it has Been intermittent for the last 3 hours, initially started after he took a nap on the couch and tried to get up. The change in position brought with a feeling of room spinning and nausea. He notes that when he holds peripheral still this gets better but when he moves around or gets worse. He denies any chest pain shortness of breath fevers chills diarrhea, cough, shortness of breath, swelling of the legs, back pain, changes in vision or speech. He has had no difficulty with coordination except for when he feels like the room is spinning in a circle. The patient has been to the urgent care where he received a intramuscular shot of Phenergan, 25 mg which helped his nausea and states that this time that he has no nausea and feels like the room is totally normal. 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  Past Medical History:  Diagnosis Date  . Age-related macular degeneration, dry, right eye    "being treated now" (08/21/2016)  . Arthritis    "neck down my spine" (08/21/2016)  . Coronary atherosclerosis of native coronary artery    PTCA/stent to LAD and diagonal 2001, LVEF 55%  . CVA (cerebral vascular accident) ([place]) 01/2011   Resolved dysarthria and right hemiparesis  . CVA (cerebral vascular accident) ([place]) 06/2014   denies residual on 08/21/2016  . Drug intolerance    ACE inhibitor; related to cough   . Essential hypertension, benign   . Hyperlipidemia   . MI (myocardial infarction) ([place]) 2000  . Partial  small bowel obstruction ([place]) 12//2012   Resolved spontaneously.  . Refusal of blood transfusions as patient is [name]     Patient Active Problem List   Diagnosis Date Noted  . Unstable angina ([place])   . CVA (cerebral infarction) 07/14/2014  . Right sided weakness 07/14/2014  . Cerebral venous thrombosis of sigmoid sinus   . Essential hypertension, benign 01/09/2009  . CORONARY ATHEROSCLEROSIS NATIVE CORONARY ARTERY 01/09/2009  . DYSLIPIDEMIA 07/17/2008    Past Surgical History:  Procedure Laterality Date  . APPENDECTOMY    . CATARACT EXTRACTION W/PHACO Right 05/14/2015   Procedure: CATARACT EXTRACTION PHACO AND INTRAOCULAR LENS PLACEMENT RIGHT EYE CDE=6.96;  Surgeon: Williams Che, MD;  Location: [facility];  Service: Ophthalmology;  Laterality: Right;  . COLONOSCOPY W/ BIOPSIES AND POLYPECTOMY  2005  . CORONARY ANGIOPLASTY WITH STENT PLACEMENT  08/21/2016  . CORONARY STENT INTERVENTION N/A 08/21/2016   Procedure: Coronary Stent Intervention;  Surgeon: Burnell Blanks, MD;  Location: [facility];  Service: Cardiovascular;  Laterality: N/A;  . LEFT HEART CATH AND CORONARY ANGIOGRAPHY N/A 08/21/2016   Procedure: Left Heart Cath and Coronary Angiography;  Surgeon: Burnell Blanks, MD;  Location: [facility];  Service: Cardiovascular;  Laterality: N/A;  . TONSILLECTOMY         Home Medications    Prior to Admission medications  Medication Sig Start Date End Date Taking? Authorizing Provider  aspirin 81 MG tablet Take 1 tablet (81 mg total) by mouth daily. 08/22/16  Yes Bhagat, Bhavinkumar, PA  Cholecalciferol (VITAMIN D3) 2000 units TABS Take 2,000 Units by mouth daily.   Yes [provider]  clopidogrel (PLAVIX) 75 MG tablet Take 75 mg by mouth daily.   Yes [provider]  metoprolol tartrate (LOPRESSOR) 25 MG tablet Take 1 tablet (25 mg total) by mouth 2 (two) times daily. 08/22/16  Yes Bhagat, Bhavinkumar, PA    polycarbophil (FIBERCON) 625 MG tablet Take 1,250 mg by mouth daily.   Yes [provider]  Polyethyl Glycol-Propyl Glycol (SYSTANE OP) Place 1 drop into both eyes 3 (three) times daily as needed (Dry Eyes).    Yes [provider]  simvastatin (ZOCOR) 40 MG tablet Take 40 mg by mouth every evening.    Yes [provider]  telmisartan (MICARDIS) 80 MG tablet Take 80 mg by mouth daily.  10/09/15  Yes [provider]  hydrocortisone 2.5 % cream Apply topically as needed. Patient not taking: Reported on 08/19/2016 07/04/16   Lendon Colonel, NP  meclizine (ANTIVERT) 12.5 MG tablet Take 1 tablet (12.5 mg total) by mouth 3 (three) times daily as needed for dizziness. 08/31/16   Noemi Chapel, MD  nitroGLYCERIN (NITROSTAT) 0.4 MG SL tablet Place 1 tablet (0.4 mg total) under the tongue every 5 (five) minutes as needed. Patient taking differently: Place 0.4 mg under the tongue every 5 (five) minutes as needed for chest pain.  04/20/15   Satira Sark, MD  nitroGLYCERIN (NITROSTAT) 0.4 MG SL tablet Place 1 tablet (0.4 mg total) under the tongue every 5 (five) minutes as needed. Patient not taking: Reported on 08/31/2016 08/19/16   Lendon Colonel, NP  UNABLE TO FIND 1 Syringe by Intravitreal route See admin instructions. Patient receives eye injections every 6 to 8 weeks.    [provider]    Family History Family History  Problem Relation Age of Onset  . Cancer Father   . Heart failure Father   . Hyperlipidemia Father   . Diabetes Sister   . Hypertension Sister   . Cancer Mother   . Cancer Brother   . Cancer Sister     Social History Social History  Substance Use Topics  . Smoking status: Former Smoker    Packs/day: 1.00    Years: 4.00    Types: Cigarettes    Quit date: 10/22/1958  . Smokeless tobacco: Never Used  . Alcohol use No     Allergies   Penicillins   Review of Systems Review of Systems  Constitutional: Negative for  chills and fever.  HENT: Negative for sore throat.   Eyes: Negative for visual disturbance.  Respiratory: Negative for cough and shortness of breath.   Cardiovascular: Negative for chest pain.  Gastrointestinal: Positive for nausea and vomiting. Negative for abdominal pain and diarrhea.  Genitourinary: Negative for dysuria and frequency.  Musculoskeletal: Negative for back pain and neck pain.  Skin: Negative for rash.  Neurological: Positive for dizziness. Negative for weakness, numbness and headaches.  Hematological: Negative for adenopathy.  Psychiatric/Behavioral: Negative for behavioral problems.     Physical Exam Updated Vital Signs BP 128/61   Pulse (!) 58   Temp (!) 97.5 F (36.4 C) (Oral)   Resp 13   SpO2 96%   Physical Exam  Constitutional: He appears well-developed and well-nourished. No distress.  HENT:  Head: Normocephalic  and atraumatic.  Mouth/Throat: Oropharynx is clear and moist. No oropharyngeal exudate.  Eyes: Conjunctivae and EOM are normal. Pupils are equal, round, and reactive to light. Right eye exhibits no discharge. Left eye exhibits no discharge. No scleral icterus.  Neck: Normal range of motion. Neck supple. No JVD present. No thyromegaly present.  Cardiovascular: Normal rate, regular rhythm, normal heart sounds and intact distal pulses.  Exam reveals no gallop and no friction rub.   No murmur heard. Pulmonary/Chest: Effort normal and breath sounds normal. No respiratory distress. He has no wheezes. He has no rales.  Abdominal: Soft. Bowel sounds are normal. He exhibits no distension and no mass. There is no tenderness.  Musculoskeletal: Normal range of motion. He exhibits no edema or tenderness.  Lymphadenopathy:    He has no cervical adenopathy.  Neurological: He is alert. Coordination normal.  The patient has normal strength in all 4 extremities, his speech is clear and memory is intact. His peripheral visual fields, extraocular movements, visual  acuity is all normal. He has no nystagmus. He has normal reflexes at the bilateral patellar tendons. He is able to perform finger-nose-finger without difficulty and has no pronator drift. Cranial nerves III through XII appear normal. When I range of motion the patient's head to sitting, leaning forward, leaning back, left side and right side there is no inducible nystagmus nor is there any inducible dizziness. He states he feels better at this time  Skin: Skin is warm and dry. No rash noted. No erythema.  Psychiatric: He has a normal mood and affect. His behavior is normal.  Nursing note and vitals reviewed.    ED Treatments / Results  Labs (all labs ordered are listed, but only abnormal results are displayed) Labs Reviewed  CBC WITH DIFFERENTIAL/PLATELET - Abnormal; Notable for the following:       Result Value   WBC 11.3 (*)    Neutro Abs 8.5 (*)    All other components within normal limits  COMPREHENSIVE METABOLIC PANEL - Abnormal; Notable for the following:    Glucose, Bld 125 (*)    ALT 13 (*)    All other components within normal limits  TROPONIN I    EKG  EKG Interpretation  Date/Time:  Sunday August 31 2016 19:42:34 EDT Ventricular Rate:  55 PR Interval:    QRS Duration: 101 QT Interval:  445 QTC Calculation: 426 R Axis:   23 Text Interpretation:  Sinus rhythm Probable anterolateral infarct, old Minimal ST elevation, inferior leads Baseline wander in lead(s) V4 since last tracing no significant change Confirmed by Noemi Chapel [phone]) on 08/31/2016 7:54:10 PM       Radiology No results found.  Procedures Procedures (including critical care time)  Medications Ordered in ED Medications  meclizine (ANTIVERT) tablet 25 mg (25 mg Oral Given 08/31/16 2016)     Initial Impression / Assessment and Plan / ED Course  I have reviewed the triage vital signs and the nursing notes.  Pertinent labs & imaging results that were available during my care of the patient were  reviewed by me and considered in my medical decision making (see chart for details).    The patient's presentation is consistent with a peripheral vertigo, at this time I will check some basic labs to make sure it is nothing else as he was recently treated for coronary disease with stenting. He denies any coronary symptoms whatsoever and this very clearly seems to be inducible with movement at home. At this time he is  totally symptom-free thankfully. We'll give some meclizine and follow closely. The EKG is unremarkable and unchanged.  Labs reviewed, no significant findings, patient is totally stable for discharge, he has not had any recurrent dizziness since arrival. Neurologic exam as stated stable and normal. I gave him all of the indications for return and precautions, he expressed his understanding and is in total agreement.  Final Clinical Impressions(s) / ED Diagnoses   Final diagnoses:  Vertigo    New Prescriptions New Prescriptions   MECLIZINE (ANTIVERT) 12.5 MG TABLET    Take 1 tablet (12.5 mg total) by mouth 3 (three) times daily as needed for dizziness.     Noemi Chapel, MD 08/31/16 2149

## 2016-08-31 NOTE — ED Triage Notes (Addendum)
Patient sent from Urgent Care for sudden onset of dizziness and vomiting starting 2-3 hours prior to arrival to ER. Denies pain. Dr Sabra Heck at bedside. States he was given IM phenergan at Urgent Care.

## 2016-09-10 DIAGNOSIS — I10 Essential (primary) hypertension: Secondary | ICD-10-CM | POA: Diagnosis not present

## 2016-09-10 DIAGNOSIS — R7301 Impaired fasting glucose: Secondary | ICD-10-CM | POA: Diagnosis not present

## 2016-09-10 DIAGNOSIS — E559 Vitamin D deficiency, unspecified: Secondary | ICD-10-CM | POA: Diagnosis not present

## 2016-09-12 DIAGNOSIS — I10 Essential (primary) hypertension: Secondary | ICD-10-CM | POA: Diagnosis not present

## 2016-09-12 DIAGNOSIS — E559 Vitamin D deficiency, unspecified: Secondary | ICD-10-CM | POA: Diagnosis not present

## 2016-09-12 DIAGNOSIS — E782 Mixed hyperlipidemia: Secondary | ICD-10-CM | POA: Diagnosis not present

## 2016-09-12 DIAGNOSIS — I679 Cerebrovascular disease, unspecified: Secondary | ICD-10-CM | POA: Diagnosis not present

## 2016-09-12 DIAGNOSIS — I25119 Atherosclerotic heart disease of native coronary artery with unspecified angina pectoris: Secondary | ICD-10-CM | POA: Diagnosis not present

## 2016-09-12 DIAGNOSIS — Z6826 Body mass index (BMI) 26.0-26.9, adult: Secondary | ICD-10-CM | POA: Diagnosis not present

## 2016-09-12 DIAGNOSIS — R7301 Impaired fasting glucose: Secondary | ICD-10-CM | POA: Diagnosis not present

## 2016-09-15 ENCOUNTER — Encounter: Payer: Self-pay | Admitting: Cardiology

## 2016-09-15 ENCOUNTER — Ambulatory Visit (INDEPENDENT_AMBULATORY_CARE_PROVIDER_SITE_OTHER): Payer: PPO | Admitting: Cardiology

## 2016-09-15 VITALS — BP 120/73 | HR 47 | Ht 69.0 in | Wt 184.0 lb

## 2016-09-15 DIAGNOSIS — I25119 Atherosclerotic heart disease of native coronary artery with unspecified angina pectoris: Secondary | ICD-10-CM

## 2016-09-15 DIAGNOSIS — Z8673 Personal history of transient ischemic attack (TIA), and cerebral infarction without residual deficits: Secondary | ICD-10-CM

## 2016-09-15 DIAGNOSIS — E782 Mixed hyperlipidemia: Secondary | ICD-10-CM

## 2016-09-15 DIAGNOSIS — I10 Essential (primary) hypertension: Secondary | ICD-10-CM | POA: Diagnosis not present

## 2016-09-15 MED ORDER — METOPROLOL TARTRATE 25 MG PO TABS
12.5000 mg | ORAL_TABLET | Freq: Two times a day (BID) | ORAL | 3 refills | Status: DC
Start: 2016-09-15 — End: 2017-07-17

## 2016-09-15 NOTE — Progress Notes (Signed)
Cardiology Office Note  Date: 09/15/2016   ID: SELDEN NOTEBOOM, DOB 1940-06-03, MRN [id number]  PCP: Celene Squibb, MD  Primary Cardiologist: Rozann Lesches, MD   Chief Complaint  Patient presents with  . Coronary Artery Disease    History of Present Illness: [name] [name] is a 76 y.o. male last seen by Ms. West Pugh in June. He was referred at that time for a cardiac catheterization in light of progressive angina symptoms. Procedure was performed by Dr. Angelena Form and patient underwent placement of DES to the RCA, DES to the LAD, and DES x 2 to the circumflex/obtuse marginal system. He presents today for follow-up, states that he feels better with much improved angina.  I reviewed his medications which are outlined below. He reports compliance with aspirin and Plavix. He was placed back on Lopressor, in the past had to come off beta blocker due to intolerance. His heart rate and blood pressure have been somewhat lower. We discussed reducing the dose and possibly stopping it altogether if needed.  He continues to follow with Dr. Nevada Crane for primary care.  Past Medical History:  Diagnosis Date  . Age-related macular degeneration, dry, right eye   . Arthritis   . Coronary atherosclerosis of native coronary artery    PTCA/stent to LAD and diagonal 2001; DES to LAD, DES to RCA, DES x 2 to circumflex/OM1 07/2016  . CVA (cerebral vascular accident) ([place]) 01/2011   Resolved dysarthria and right hemiparesis  . CVA (cerebral vascular accident) ([place]) 06/2014  . Drug intolerance    ACE inhibitor; related to cough   . Essential hypertension, benign   . Hyperlipidemia   . MI (myocardial infarction) ([place]) 2000  . Partial small bowel obstruction ([place]) 12//2012   Resolved spontaneously.  . Refusal of blood transfusions as patient is [name]     Past Surgical History:  Procedure Laterality Date  . APPENDECTOMY    . CATARACT EXTRACTION W/PHACO Right 05/14/2015   Procedure:  CATARACT EXTRACTION PHACO AND INTRAOCULAR LENS PLACEMENT RIGHT EYE CDE=6.96;  Surgeon: Williams Che, MD;  Location: [facility];  Service: Ophthalmology;  Laterality: Right;  . COLONOSCOPY W/ BIOPSIES AND POLYPECTOMY  2005  . CORONARY ANGIOPLASTY WITH STENT PLACEMENT  08/21/2016  . CORONARY STENT INTERVENTION N/A 08/21/2016   Procedure: Coronary Stent Intervention;  Surgeon: Burnell Blanks, MD;  Location: [facility];  Service: Cardiovascular;  Laterality: N/A;  . LEFT HEART CATH AND CORONARY ANGIOGRAPHY N/A 08/21/2016   Procedure: Left Heart Cath and Coronary Angiography;  Surgeon: Burnell Blanks, MD;  Location: [facility];  Service: Cardiovascular;  Laterality: N/A;  . TONSILLECTOMY      Current Outpatient Prescriptions  Medication Sig Dispense Refill  . aspirin 81 MG tablet Take 1 tablet (81 mg total) by mouth daily. 30 tablet 11  . Cholecalciferol (VITAMIN D3) 2000 units TABS Take 2,000 Units by mouth daily.    . clopidogrel (PLAVIX) 75 MG tablet Take 75 mg by mouth daily.    . hydrocortisone 2.5 % cream Apply topically as needed. 30 g 0  . meclizine (ANTIVERT) 12.5 MG tablet Take 1 tablet (12.5 mg total) by mouth 3 (three) times daily as needed for dizziness. 30 tablet 0  . nitroGLYCERIN (NITROSTAT) 0.4 MG SL tablet Place 1 tablet (0.4 mg total) under the tongue every 5 (five) minutes as needed. 25 tablet 3  . polycarbophil (FIBERCON) 625 MG tablet Take 1,250 mg by mouth daily.    Marland Kitchen  Polyethyl Glycol-Propyl Glycol (SYSTANE OP) Place 1 drop into both eyes 3 (three) times daily as needed (Dry Eyes).     . simvastatin (ZOCOR) 40 MG tablet Take 40 mg by mouth every evening.     Marland Kitchen telmisartan (MICARDIS) 80 MG tablet Take 80 mg by mouth daily.     Marland Kitchen UNABLE TO FIND 1 Syringe by Intravitreal route See admin instructions. Patient receives eye injections every 6 to 8 weeks.    . metoprolol tartrate (LOPRESSOR) 25 MG tablet Take 0.5 tablets (12.5 mg total) by mouth 2  (two) times daily. 45 tablet 3   No current facility-administered medications for this visit.    Allergies:  Penicillins   Social History: The patient  reports that he quit smoking about 57 years ago. His smoking use included Cigarettes. He has a 4.00 pack-year smoking history. He has never used smokeless tobacco. He reports that he does not drink alcohol or use drugs.   ROS:  Please see the history of present illness. Otherwise, complete review of systems is positive for none.  All other systems are reviewed and negative.   Physical Exam: VS:  BP 120/73   Pulse (!) 47   Ht 5\' 9"  (1.753 m)   Wt 184 lb (83.5 kg)   BMI 27.17 kg/m , BMI Body mass index is 27.17 kg/m.  Wt Readings from Last 3 Encounters:  09/15/16 184 lb (83.5 kg)  08/22/16 181 lb 3.5 oz (82.2 kg)  08/18/16 182 lb (82.6 kg)    General: Elderly male, appears comfortable at rest. HEENT: Conjunctiva and lids normal, oropharynx clear. Neck: Supple, no elevated JVP or carotid bruits, no thyromegaly. Lungs: Clear to auscultation, nonlabored breathing at rest. Cardiac: Regular rate and rhythm, no S3 or significant systolic murmur, no pericardial rub. Abdomen: Soft, nontender, bowel sounds present, no guarding or rebound. Extremities: No pitting edema, distal pulses 2+. Skin: Warm and dry. Musculoskeletal: No kyphosis. Neuropsychiatric: Alert and oriented x3, affect grossly appropriate.  ECG: I personally reviewed the tracing from 08/31/2016 which showed sinus rhythm with old anterior infarct pattern, NSST changes.  Recent Labwork: 08/31/2016: ALT 13; AST 16; BUN 19; Creatinine, Ser 0.92; Hemoglobin 13.9; Platelets 219; Potassium 4.1; Sodium 143     Component Value Date/Time   CHOL 158 07/14/2014 0910   TRIG 136 07/14/2014 0910   HDL 36 (L) 07/14/2014 0910   CHOLHDL 4.4 07/14/2014 0910   VLDL 27 07/14/2014 0910   LDLCALC 95 07/14/2014 0910    Other Studies Reviewed Today:  Cardiac catheterization and PCI  08/21/2016:  Mid RCA to Dist RCA lesion, 10 %stenosed.  Post intervention, there is a 0% residual stenosis.  A STENT RESOLUTE ONYX 3.0X22 drug eluting stent was successfully placed, and overlaps previously placed stent.  Prox LAD lesion, 90 %stenosed.  Post intervention, there is a 0% residual stenosis.  A STENT RESOLUTE ONYX 2.42A83 drug eluting stent was successfully placed.  Ost 1st Mrg to 1st Mrg lesion, 99 %stenosed.  Post intervention, there is a 0% residual stenosis.  A STENT RESOLUTE ONYX 3.0X12 drug eluting stent was successfully placed.  Ost Cx to Prox Cx lesion, 99 %stenosed.  Post intervention, there is a 0% residual stenosis.  Mid RCA lesion, 99 %stenosed.  Post intervention, there is a 0% residual stenosis.  A STENT RESOLUTE ONYX 3.0X15 drug eluting stent was successfully placed, and overlaps previously placed stent.  The left ventricular systolic function is normal.  LV end diastolic pressure is normal.  The left ventricular  ejection fraction is 55-65% by visual estimate.  There is no mitral valve regurgitation.  Ost 1st Diag to 1st Diag lesion, 60 %stenosed.  RPDA lesion, 30 %stenosed.  Prox RCA lesion, 20 %stenosed.   1. Severe triple vessel CAD 2. Successful PTCA/DES x 1 distal RCA 3. Successful PTCA/DES x 1 proximal LAD 4. Successful PTCA/DES x 2 proximal Circumflex/OM1 5. Normal LV systolic function  Recommendations: DAPT with ASA and Plavix for lifetime. Start beta blocker at low dose. Continue statin.   Assessment and Plan:  1. Multivessel CAD status post remote LAD and diagonal interventions, more recently status post DES interventions to the LAD, RCA, and circumflex/OM1 system due to progressive angina symptoms on medical therapy. Symptoms have improved significantly. Plan to continue observation. Reduce Lopressor to 12.5 mg twice daily, may need to stop it altogether depending on how he does. He has had some element of prior beta blocker  intolerance.  2. Hyperlipidemia on Zocor, continues to follow with Dr. Nevada Crane.  3. Essential hypertension, blood pressure is normal today.  4. Previous history of stroke. He continues on antiplatelet regimen with statin.  Current medicines were reviewed with the patient today.  Disposition: Follow-up in 6 months.   Signed, Satira Sark, MD, [facility] 09/15/2016 1:26 PM    [address] [facility] [place], [place], [place] [address] Phone: [phone]; Fax: [phone]

## 2016-09-15 NOTE — Patient Instructions (Signed)
Medication Instructions:  Your physician has recommended you make the following change in your medication:  Begin taking Lopressor 12.5 mg daily  Please continue all other medications as prescribed  Labwork: NONE  Testing/Procedures: NONE  Follow-Up: Your physician wants you to follow-up in: [place] DR. Domenic Polite You will receive a reminder letter in the mail two months in advance. If you don't receive a letter, please call our office to schedule the follow-up appointment.  Any Other Special Instructions Will Be Listed Below (If Applicable).  If you need a refill on your cardiac medications before your next appointment, please call your pharmacy.

## 2016-09-19 ENCOUNTER — Ambulatory Visit: Payer: PPO | Admitting: Cardiology

## 2016-10-02 DIAGNOSIS — H35372 Puckering of macula, left eye: Secondary | ICD-10-CM | POA: Diagnosis not present

## 2016-10-02 DIAGNOSIS — H35371 Puckering of macula, right eye: Secondary | ICD-10-CM | POA: Diagnosis not present

## 2016-10-02 DIAGNOSIS — H353211 Exudative age-related macular degeneration, right eye, with active choroidal neovascularization: Secondary | ICD-10-CM | POA: Diagnosis not present

## 2016-10-02 DIAGNOSIS — H353112 Nonexudative age-related macular degeneration, right eye, intermediate dry stage: Secondary | ICD-10-CM | POA: Diagnosis not present

## 2016-11-06 DIAGNOSIS — H35372 Puckering of macula, left eye: Secondary | ICD-10-CM | POA: Diagnosis not present

## 2016-11-06 DIAGNOSIS — H33102 Unspecified retinoschisis, left eye: Secondary | ICD-10-CM | POA: Diagnosis not present

## 2016-11-06 DIAGNOSIS — H353112 Nonexudative age-related macular degeneration, right eye, intermediate dry stage: Secondary | ICD-10-CM | POA: Diagnosis not present

## 2016-11-06 DIAGNOSIS — H35371 Puckering of macula, right eye: Secondary | ICD-10-CM | POA: Diagnosis not present

## 2016-11-06 DIAGNOSIS — H353211 Exudative age-related macular degeneration, right eye, with active choroidal neovascularization: Secondary | ICD-10-CM | POA: Diagnosis not present

## 2016-11-24 DIAGNOSIS — Z23 Encounter for immunization: Secondary | ICD-10-CM | POA: Diagnosis not present

## 2016-12-08 DIAGNOSIS — L570 Actinic keratosis: Secondary | ICD-10-CM | POA: Diagnosis not present

## 2016-12-08 DIAGNOSIS — D225 Melanocytic nevi of trunk: Secondary | ICD-10-CM | POA: Diagnosis not present

## 2016-12-08 DIAGNOSIS — X32XXXD Exposure to sunlight, subsequent encounter: Secondary | ICD-10-CM | POA: Diagnosis not present

## 2016-12-18 DIAGNOSIS — H35372 Puckering of macula, left eye: Secondary | ICD-10-CM | POA: Diagnosis not present

## 2016-12-18 DIAGNOSIS — H35371 Puckering of macula, right eye: Secondary | ICD-10-CM | POA: Diagnosis not present

## 2016-12-18 DIAGNOSIS — H353211 Exudative age-related macular degeneration, right eye, with active choroidal neovascularization: Secondary | ICD-10-CM | POA: Diagnosis not present

## 2016-12-18 DIAGNOSIS — H353112 Nonexudative age-related macular degeneration, right eye, intermediate dry stage: Secondary | ICD-10-CM | POA: Diagnosis not present

## 2016-12-29 DIAGNOSIS — Z6827 Body mass index (BMI) 27.0-27.9, adult: Secondary | ICD-10-CM | POA: Diagnosis not present

## 2016-12-29 DIAGNOSIS — J069 Acute upper respiratory infection, unspecified: Secondary | ICD-10-CM | POA: Diagnosis not present

## 2016-12-29 DIAGNOSIS — R05 Cough: Secondary | ICD-10-CM | POA: Diagnosis not present

## 2016-12-29 DIAGNOSIS — R070 Pain in throat: Secondary | ICD-10-CM | POA: Diagnosis not present

## 2017-02-12 DIAGNOSIS — H35371 Puckering of macula, right eye: Secondary | ICD-10-CM | POA: Diagnosis not present

## 2017-02-12 DIAGNOSIS — H35372 Puckering of macula, left eye: Secondary | ICD-10-CM | POA: Diagnosis not present

## 2017-02-12 DIAGNOSIS — H353211 Exudative age-related macular degeneration, right eye, with active choroidal neovascularization: Secondary | ICD-10-CM | POA: Diagnosis not present

## 2017-02-12 DIAGNOSIS — H353112 Nonexudative age-related macular degeneration, right eye, intermediate dry stage: Secondary | ICD-10-CM | POA: Diagnosis not present

## 2017-02-12 DIAGNOSIS — H33102 Unspecified retinoschisis, left eye: Secondary | ICD-10-CM | POA: Diagnosis not present

## 2017-03-13 DIAGNOSIS — E559 Vitamin D deficiency, unspecified: Secondary | ICD-10-CM | POA: Diagnosis not present

## 2017-03-13 DIAGNOSIS — E782 Mixed hyperlipidemia: Secondary | ICD-10-CM | POA: Diagnosis not present

## 2017-03-13 DIAGNOSIS — R7301 Impaired fasting glucose: Secondary | ICD-10-CM | POA: Diagnosis not present

## 2017-03-13 DIAGNOSIS — I10 Essential (primary) hypertension: Secondary | ICD-10-CM | POA: Diagnosis not present

## 2017-03-16 DIAGNOSIS — R7301 Impaired fasting glucose: Secondary | ICD-10-CM | POA: Diagnosis not present

## 2017-03-16 DIAGNOSIS — Z6828 Body mass index (BMI) 28.0-28.9, adult: Secondary | ICD-10-CM | POA: Diagnosis not present

## 2017-03-16 DIAGNOSIS — E559 Vitamin D deficiency, unspecified: Secondary | ICD-10-CM | POA: Diagnosis not present

## 2017-03-16 DIAGNOSIS — E785 Hyperlipidemia, unspecified: Secondary | ICD-10-CM | POA: Diagnosis not present

## 2017-03-16 DIAGNOSIS — I10 Essential (primary) hypertension: Secondary | ICD-10-CM | POA: Diagnosis not present

## 2017-03-16 NOTE — Progress Notes (Signed)
Cardiology Office Note  Date: 03/17/2017   ID: [name] [name], DOB [birth year], MRN [id number]  PCP: Celene Squibb, MD  Primary Cardiologist: Rozann Lesches, MD   Chief Complaint  Patient presents with  . Coronary Artery Disease    History of Present Illness:  [name] [name] is a 77 y.o. male last seen in July 2018. He is here today with his wife for a routine follow-up visit. From a cardiac perspective he reports no angina symptoms or nitroglycerin use. He states that he has been compliant with his medications as outlined below.  He had a recent visit with Dr. Nevada Crane with lab work obtained. I reviewed the results below.  We went over his cardiac medications which include aspirin, Plavix, Lopressor, Micardis, Zocor, and as needed nitroglycerin. At the last visit we did reduce his Lopressor dose and he has tolerated this better. Heart rate is in the 60s.  Past Medical History:  Diagnosis Date  . Age-related macular degeneration, dry, right eye   . Arthritis   . Coronary atherosclerosis of native coronary artery    PTCA/stent to LAD and diagonal 2001; DES to LAD, DES to RCA, DES x 2 to circumflex/OM1 07/2016  . CVA (cerebral vascular accident) ([place]) 01/2011   Resolved dysarthria and right hemiparesis  . CVA (cerebral vascular accident) ([place]) 06/2014  . Drug intolerance    ACE inhibitor; related to cough   . Essential hypertension, benign   . Hyperlipidemia   . MI (myocardial infarction) ([place]) 2000  . Partial small bowel obstruction ([place]) 12//2012   Resolved spontaneously.  . Refusal of blood transfusions as patient is [name]     Past Surgical History:  Procedure Laterality Date  . APPENDECTOMY    . CATARACT EXTRACTION W/PHACO Right 05/14/2015   Procedure: CATARACT EXTRACTION PHACO AND INTRAOCULAR LENS PLACEMENT RIGHT EYE CDE=6.96;  Surgeon: Williams Che, MD;  Location: [facility];  Service: Ophthalmology;  Laterality: Right;  . COLONOSCOPY W/ BIOPSIES AND  POLYPECTOMY  2005  . CORONARY ANGIOPLASTY WITH STENT PLACEMENT  08/21/2016  . CORONARY STENT INTERVENTION N/A 08/21/2016   Procedure: Coronary Stent Intervention;  Surgeon: Burnell Blanks, MD;  Location: [facility];  Service: Cardiovascular;  Laterality: N/A;  . LEFT HEART CATH AND CORONARY ANGIOGRAPHY N/A 08/21/2016   Procedure: Left Heart Cath and Coronary Angiography;  Surgeon: Burnell Blanks, MD;  Location: [facility];  Service: Cardiovascular;  Laterality: N/A;  . TONSILLECTOMY      Current Outpatient Medications  Medication Sig Dispense Refill  . aspirin 81 MG tablet Take 1 tablet (81 mg total) by mouth daily. 30 tablet 11  . Cholecalciferol (VITAMIN D3) 2000 units TABS Take 2,000 Units by mouth daily.    . clopidogrel (PLAVIX) 75 MG tablet Take 75 mg by mouth daily.    . hydrocortisone 2.5 % cream Apply topically as needed. 30 g 0  . meclizine (ANTIVERT) 12.5 MG tablet Take 1 tablet (12.5 mg total) by mouth 3 (three) times daily as needed for dizziness. 30 tablet 0  . nitroGLYCERIN (NITROSTAT) 0.4 MG SL tablet Place 1 tablet (0.4 mg total) under the tongue every 5 (five) minutes as needed. 25 tablet 3  . polycarbophil (FIBERCON) 625 MG tablet Take 1,250 mg by mouth daily.    [name] Glycol-Propyl Glycol (SYSTANE OP) Place 1 drop into both eyes 3 (three) times daily as needed (Dry Eyes).     . simvastatin (ZOCOR) 40 MG tablet Take  40 mg by mouth every evening.     Marland Kitchen telmisartan (MICARDIS) 80 MG tablet Take 80 mg by mouth daily.     Marland Kitchen UNABLE TO FIND 1 Syringe by Intravitreal route See admin instructions. Patient receives eye injections every 6 to 8 weeks.    . metoprolol tartrate (LOPRESSOR) 25 MG tablet Take 0.5 tablets (12.5 mg total) by mouth 2 (two) times daily. 45 tablet 3   No current facility-administered medications for this visit.    Allergies:  Penicillins   Social History: The patient  reports that he quit smoking about 58 years ago. His  smoking use included cigarettes. He has a 4.00 pack-year smoking history. he has never used smokeless tobacco. He reports that he does not drink alcohol or use drugs.   ROS:  Please see the history of present illness. Otherwise, complete review of systems is positive for none.  All other systems are reviewed and negative.   Physical Exam: VS:  BP 132/70   Pulse 64   Ht 5\' 9"  (1.753 m)   Wt 186 lb 6.4 oz (84.6 kg)   SpO2 96%   BMI 27.53 kg/m , BMI Body mass index is 27.53 kg/m.  Wt Readings from Last 3 Encounters:  03/17/17 186 lb 6.4 oz (84.6 kg)  09/15/16 184 lb (83.5 kg)  08/22/16 181 lb 3.5 oz (82.2 kg)    General: Elderly male, appears comfortable at rest. HEENT: Conjunctiva and lids normal, oropharynx clear. Neck: Supple, no elevated JVP or carotid bruits, no thyromegaly. Lungs: Clear to auscultation, nonlabored breathing at rest. Cardiac: Regular rate and rhythm, no S3 or significant systolic murmur. Abdomen: Soft, nontender, bowel sounds present. Extremities: No pitting edema, distal pulses 2+. Skin: Warm and dry. Musculoskeletal: No kyphosis. Neuropsychiatric: Alert and oriented x3, affect grossly appropriate.  ECG: I personally reviewed the tracing from 08/31/2016 which showed sinus rhythm with possible old anterior infarct pattern.  Recent Labwork: 08/31/2016: ALT 13; AST 16; BUN 19; Creatinine, Ser 0.92; Hemoglobin 13.9; Platelets 219; Potassium 4.1; Sodium 143  January 2018: Cholesterol 139, HDL 36, triglycerides 113, LDL 82 January 2019: Hgb 14.6, platelets 194, BUN 15, creatinine 0.88, potassium 5.0, AST 17, ALT 13, cholesterol 148, TG 76, HDL, 37, LDL 96, Hgb A1c 6.0  Other Studies Reviewed Today:  Cardiac catheterization and PCI 08/21/2016:  Mid RCA to Dist RCA lesion, 10 %stenosed.  Post intervention, there is a 0% residual stenosis.  A STENT RESOLUTE ONYX 3.0X22 drug eluting stent was successfully placed, and overlaps previously placed stent.  Prox LAD  lesion, 90 %stenosed.  Post intervention, there is a 0% residual stenosis.  A STENT RESOLUTE ONYX 9.93Z16 drug eluting stent was successfully placed.  Ost 1st Mrg to 1st Mrg lesion, 99 %stenosed.  Post intervention, there is a 0% residual stenosis.  A STENT RESOLUTE ONYX 3.0X12 drug eluting stent was successfully placed.  Ost Cx to Prox Cx lesion, 99 %stenosed.  Post intervention, there is a 0% residual stenosis.  Mid RCA lesion, 99 %stenosed.  Post intervention, there is a 0% residual stenosis.  A STENT RESOLUTE ONYX 3.0X15 drug eluting stent was successfully placed, and overlaps previously placed stent.  The left ventricular systolic function is normal.  LV end diastolic pressure is normal.  The left ventricular ejection fraction is 55-65% by visual estimate.  There is no mitral valve regurgitation.  Ost 1st Diag to 1st Diag lesion, 60 %stenosed.  RPDA lesion, 30 %stenosed.  Prox RCA lesion, 20 %stenosed.  1. Severe triple  vessel CAD 2. Successful PTCA/DES x 1 distal RCA 3. Successful PTCA/DES x 1 proximal LAD 4. Successful PTCA/DES x 2 proximal Circumflex/OM1 5. Normal LV systolic function  Recommendations: DAPT with ASA and Plavix for lifetime. Start beta blocker at low dose. Continue statin.   Assessment and Plan:  1. Multivessel CAD most recently status post DES intervention to the LAD, RCA, and circumflex/OM system in June 2018. He continues on long-term dual antiplatelet therapy and reports no bleeding problems. He is tolerating lower dose Lopressor with more energy, heart rate is in the 60s at rest. No changes were made today.  2. Mixed hyperlipidemia, continue Zocor. Recent lab work reviewed per Dr. Nevada Crane.  3. Essential hypertension, blood pressure control is adequate today.  4. History of stroke. Continues on aspirin and statin.  Current medicines were reviewed with the patient today.   Disposition: Follow-up in 6 months.  Signed, Satira Sark, MD, [facility] 03/17/2017 11:27 AM    [place] at [place], [place], [place] [address] Phone: [phone]; Fax: [phone]

## 2017-03-17 ENCOUNTER — Encounter: Payer: Self-pay | Admitting: Cardiology

## 2017-03-17 ENCOUNTER — Ambulatory Visit: Payer: PPO | Admitting: Cardiology

## 2017-03-17 VITALS — BP 132/70 | HR 64 | Ht 69.0 in | Wt 186.4 lb

## 2017-03-17 DIAGNOSIS — I25119 Atherosclerotic heart disease of native coronary artery with unspecified angina pectoris: Secondary | ICD-10-CM

## 2017-03-17 DIAGNOSIS — I10 Essential (primary) hypertension: Secondary | ICD-10-CM

## 2017-03-17 DIAGNOSIS — Z8673 Personal history of transient ischemic attack (TIA), and cerebral infarction without residual deficits: Secondary | ICD-10-CM

## 2017-03-17 DIAGNOSIS — E782 Mixed hyperlipidemia: Secondary | ICD-10-CM

## 2017-03-17 NOTE — Patient Instructions (Signed)

## 2017-04-09 DIAGNOSIS — H35372 Puckering of macula, left eye: Secondary | ICD-10-CM | POA: Diagnosis not present

## 2017-04-09 DIAGNOSIS — H35371 Puckering of macula, right eye: Secondary | ICD-10-CM | POA: Diagnosis not present

## 2017-04-09 DIAGNOSIS — H26491 Other secondary cataract, right eye: Secondary | ICD-10-CM | POA: Diagnosis not present

## 2017-04-09 DIAGNOSIS — H353211 Exudative age-related macular degeneration, right eye, with active choroidal neovascularization: Secondary | ICD-10-CM | POA: Diagnosis not present

## 2017-04-23 DIAGNOSIS — H26491 Other secondary cataract, right eye: Secondary | ICD-10-CM | POA: Diagnosis not present

## 2017-06-10 DIAGNOSIS — H353112 Nonexudative age-related macular degeneration, right eye, intermediate dry stage: Secondary | ICD-10-CM | POA: Diagnosis not present

## 2017-06-10 DIAGNOSIS — H35371 Puckering of macula, right eye: Secondary | ICD-10-CM | POA: Diagnosis not present

## 2017-06-10 DIAGNOSIS — H35372 Puckering of macula, left eye: Secondary | ICD-10-CM | POA: Diagnosis not present

## 2017-06-10 DIAGNOSIS — H353211 Exudative age-related macular degeneration, right eye, with active choroidal neovascularization: Secondary | ICD-10-CM | POA: Diagnosis not present

## 2017-07-17 ENCOUNTER — Other Ambulatory Visit: Payer: Self-pay | Admitting: Cardiology

## 2017-07-17 DIAGNOSIS — J060 Acute laryngopharyngitis: Secondary | ICD-10-CM | POA: Diagnosis not present

## 2017-07-17 MED ORDER — METOPROLOL TARTRATE 25 MG PO TABS: 12.5000 mg | ORAL_TABLET | Freq: Two times a day (BID) | ORAL | 3 refills | Status: DC

## 2017-07-17 NOTE — Telephone Encounter (Signed)
Done

## 2017-07-17 NOTE — Telephone Encounter (Signed)
° ° °  1. Which medications need to be refilled? (please list name of each medication and dose if known)   metoprolol tartrate (LOPRESSOR) 25 MG tablet    2. Which pharmacy/location (including street and city if local pharmacy) is medication to be sent to    [facility], [place]   3. Do they need a 30 day or 90 day supply?

## 2017-07-28 DIAGNOSIS — S70362A Insect bite (nonvenomous), left thigh, initial encounter: Secondary | ICD-10-CM | POA: Diagnosis not present

## 2017-07-28 DIAGNOSIS — X32XXXD Exposure to sunlight, subsequent encounter: Secondary | ICD-10-CM | POA: Diagnosis not present

## 2017-07-28 DIAGNOSIS — L570 Actinic keratosis: Secondary | ICD-10-CM | POA: Diagnosis not present

## 2017-08-06 DIAGNOSIS — H353112 Nonexudative age-related macular degeneration, right eye, intermediate dry stage: Secondary | ICD-10-CM | POA: Diagnosis not present

## 2017-08-06 DIAGNOSIS — H35372 Puckering of macula, left eye: Secondary | ICD-10-CM | POA: Diagnosis not present

## 2017-08-06 DIAGNOSIS — H353211 Exudative age-related macular degeneration, right eye, with active choroidal neovascularization: Secondary | ICD-10-CM | POA: Diagnosis not present

## 2017-08-06 DIAGNOSIS — H35371 Puckering of macula, right eye: Secondary | ICD-10-CM | POA: Diagnosis not present

## 2017-09-15 DIAGNOSIS — G459 Transient cerebral ischemic attack, unspecified: Secondary | ICD-10-CM | POA: Diagnosis not present

## 2017-09-15 DIAGNOSIS — E559 Vitamin D deficiency, unspecified: Secondary | ICD-10-CM | POA: Diagnosis not present

## 2017-09-15 DIAGNOSIS — I10 Essential (primary) hypertension: Secondary | ICD-10-CM | POA: Diagnosis not present

## 2017-09-15 DIAGNOSIS — H3530 Unspecified macular degeneration: Secondary | ICD-10-CM | POA: Diagnosis not present

## 2017-09-15 DIAGNOSIS — I259 Chronic ischemic heart disease, unspecified: Secondary | ICD-10-CM | POA: Diagnosis not present

## 2017-09-15 DIAGNOSIS — Z23 Encounter for immunization: Secondary | ICD-10-CM | POA: Diagnosis not present

## 2017-09-15 DIAGNOSIS — E782 Mixed hyperlipidemia: Secondary | ICD-10-CM | POA: Diagnosis not present

## 2017-09-15 DIAGNOSIS — I2510 Atherosclerotic heart disease of native coronary artery without angina pectoris: Secondary | ICD-10-CM | POA: Diagnosis not present

## 2017-09-15 DIAGNOSIS — R7301 Impaired fasting glucose: Secondary | ICD-10-CM | POA: Diagnosis not present

## 2017-09-15 DIAGNOSIS — H9319 Tinnitus, unspecified ear: Secondary | ICD-10-CM | POA: Diagnosis not present

## 2017-09-15 DIAGNOSIS — I25119 Atherosclerotic heart disease of native coronary artery with unspecified angina pectoris: Secondary | ICD-10-CM | POA: Diagnosis not present

## 2017-09-15 DIAGNOSIS — M720 Palmar fascial fibromatosis [Dupuytren]: Secondary | ICD-10-CM | POA: Diagnosis not present

## 2017-09-17 DIAGNOSIS — I10 Essential (primary) hypertension: Secondary | ICD-10-CM | POA: Diagnosis not present

## 2017-09-17 DIAGNOSIS — E782 Mixed hyperlipidemia: Secondary | ICD-10-CM | POA: Diagnosis not present

## 2017-09-17 DIAGNOSIS — Z0001 Encounter for general adult medical examination with abnormal findings: Secondary | ICD-10-CM | POA: Diagnosis not present

## 2017-09-17 DIAGNOSIS — E559 Vitamin D deficiency, unspecified: Secondary | ICD-10-CM | POA: Diagnosis not present

## 2017-09-17 DIAGNOSIS — R7303 Prediabetes: Secondary | ICD-10-CM | POA: Diagnosis not present

## 2017-09-17 DIAGNOSIS — Z6828 Body mass index (BMI) 28.0-28.9, adult: Secondary | ICD-10-CM | POA: Diagnosis not present

## 2017-09-17 NOTE — Progress Notes (Signed)
Cardiology Office Note  Date: 09/21/2017   ID: [name] [name], DOB February 07, 1941, MRN [id number]  PCP: Celene Squibb, MD  Primary Cardiologist: Rozann Lesches, MD   Chief Complaint  Patient presents with  . Coronary Artery Disease    History of Present Illness: [name] [name] is a 77 y.o. male last seen in January.  He is here with his wife today for a follow-up visit.  Reports no angina symptoms and stable NYHA class II dyspnea.  No palpitations or syncope.  We went over his medications and discussed stopping Lopressor altogether.  His heart rate is in the mid 50s today even on 12.5 mg twice daily and he is not having any angina symptoms.  I reviewed his recent follow-up lab work.  Remainder of his cardiac regimen is stable.  I personally reviewed his ECG today which shows sinus bradycardia.  Past Medical History:  Diagnosis Date  . Age-related macular degeneration, dry, right eye   . Arthritis   . Coronary atherosclerosis of native coronary artery    PTCA/stent to LAD and diagonal 2001; DES to LAD, DES to RCA, DES x 2 to circumflex/OM1 07/2016  . CVA (cerebral vascular accident) ([place]) 01/2011   Resolved dysarthria and right hemiparesis  . CVA (cerebral vascular accident) ([place]) 06/2014  . Drug intolerance    ACE inhibitor; related to cough   . Essential hypertension, benign   . Hyperlipidemia   . MI (myocardial infarction) ([place]) 2000  . Partial small bowel obstruction ([place]) 12//2012   Resolved spontaneously.  . Refusal of blood transfusions as patient is [name]     Past Surgical History:  Procedure Laterality Date  . APPENDECTOMY    . CATARACT EXTRACTION W/PHACO Right 05/14/2015   Procedure: CATARACT EXTRACTION PHACO AND INTRAOCULAR LENS PLACEMENT RIGHT EYE CDE=6.96;  Surgeon: Williams Che, MD;  Location: [facility];  Service: Ophthalmology;  Laterality: Right;  . COLONOSCOPY W/ BIOPSIES AND POLYPECTOMY  2005  . CORONARY ANGIOPLASTY WITH STENT PLACEMENT   08/21/2016  . CORONARY STENT INTERVENTION N/A 08/21/2016   Procedure: Coronary Stent Intervention;  Surgeon: Burnell Blanks, MD;  Location: [place] [facility];  Service: Cardiovascular;  Laterality: N/A;  . LEFT HEART CATH AND CORONARY ANGIOGRAPHY N/A 08/21/2016   Procedure: Left Heart Cath and Coronary Angiography;  Surgeon: Burnell Blanks, MD;  Location: [place];  Service: Cardiovascular;  Laterality: N/A;  . TONSILLECTOMY      Current Outpatient Medications  Medication Sig Dispense Refill  . aspirin 81 MG tablet Take 1 tablet (81 mg total) by mouth daily. 30 tablet 11  . Cholecalciferol (VITAMIN D3) 2000 units TABS Take 2,000 Units by mouth daily.    . clopidogrel (PLAVIX) 75 MG tablet Take 75 mg by mouth daily.    . metoprolol tartrate (LOPRESSOR) 25 MG tablet Take 0.5 tablets (12.5 mg total) by mouth 2 (two) times daily. 30 tablet 3  . nitroGLYCERIN (NITROSTAT) 0.4 MG SL tablet Place 1 tablet (0.4 mg total) under the tongue every 5 (five) minutes as needed. 25 tablet 3  . polycarbophil (FIBERCON) 625 MG tablet Take 1,250 mg by mouth daily.    [name] Glycol-Propyl Glycol (SYSTANE OP) Place 1 drop into both eyes 3 (three) times daily as needed (Dry Eyes).     . simvastatin (ZOCOR) 40 MG tablet Take 40 mg by mouth every evening.     Marland Kitchen telmisartan (MICARDIS) 80 MG tablet Take 80 mg by mouth daily.     Marland Kitchen  UNABLE TO FIND 1 Syringe by Intravitreal route See admin instructions. Patient receives eye injections every 6 to 8 weeks.     No current facility-administered medications for this visit.    Allergies:  Penicillins   Social History: The patient  reports that he quit smoking about 58 years ago. His smoking use included cigarettes. He has a 4.00 pack-year smoking history. He has never used smokeless tobacco. He reports that he does not drink alcohol or use drugs.   ROS:  Please see the history of present illness. Otherwise, complete review of systems is  positive for none.  All other systems are reviewed and negative.   Physical Exam: VS:  BP 122/64   Pulse (!) 55   Ht 5\' 9"  (1.753 m)   Wt 186 lb (84.4 kg)   SpO2 97%   BMI 27.47 kg/m , BMI Body mass index is 27.47 kg/m.  Wt Readings from Last 3 Encounters:  09/21/17 186 lb (84.4 kg)  03/17/17 186 lb 6.4 oz (84.6 kg)  09/15/16 184 lb (83.5 kg)    General: Elderly male, appears comfortable at rest. HEENT: Conjunctiva and lids normal, oropharynx clear. Neck: Supple, no elevated JVP or carotid bruits, no thyromegaly. Lungs: Clear to auscultation, nonlabored breathing at rest. Cardiac: Regular rate and rhythm, no S3 or significant systolic murmur. Abdomen: Soft, nontender, bowel sounds present. Extremities: No pitting edema, distal pulses 2+. Skin: Warm and dry. Musculoskeletal: No kyphosis. Neuropsychiatric: Alert and oriented x3, affect grossly appropriate.  ECG: I personally reviewed the tracing from 08/31/2016 which showed sinus rhythm with possible old anterior infarct pattern.  Recent Labwork:  January 2019: Hgb 14.6, platelets 194, BUN 15, creatinine 0.88, potassium 5.0, AST 17, ALT 13, cholesterol 148, TG 76, HDL, 37, LDL 96, Hgb A1c 6.0  Other Studies Reviewed Today:  Cardiac catheterization and PCI 08/21/2016:  Mid RCA to Dist RCA lesion, 10 %stenosed.  Post intervention, there is a 0% residual stenosis.  A STENT RESOLUTE ONYX 3.0X22 drug eluting stent was successfully placed, and overlaps previously placed stent.  Prox LAD lesion, 90 %stenosed.  Post intervention, there is a 0% residual stenosis.  A STENT RESOLUTE ONYX 8.88B16 drug eluting stent was successfully placed.  Ost 1st Mrg to 1st Mrg lesion, 99 %stenosed.  Post intervention, there is a 0% residual stenosis.  A STENT RESOLUTE ONYX 3.0X12 drug eluting stent was successfully placed.  Ost Cx to Prox Cx lesion, 99 %stenosed.  Post intervention, there is a 0% residual stenosis.  Mid RCA lesion, 99  %stenosed.  Post intervention, there is a 0% residual stenosis.  A STENT RESOLUTE ONYX 3.0X15 drug eluting stent was successfully placed, and overlaps previously placed stent.  The left ventricular systolic function is normal.  LV end diastolic pressure is normal.  The left ventricular ejection fraction is 55-65% by visual estimate.  There is no mitral valve regurgitation.  Ost 1st Diag to 1st Diag lesion, 60 %stenosed.  RPDA lesion, 30 %stenosed.  Prox RCA lesion, 20 %stenosed.  1. Severe triple vessel CAD 2. Successful PTCA/DES x 1 distal RCA 3. Successful PTCA/DES x 1 proximal LAD 4. Successful PTCA/DES x 2 proximal Circumflex/OM1 5. Normal LV systolic function  Recommendations: DAPT with ASA and Plavix for lifetime. Start beta blocker at low dose. Continue statin.  Assessment and Plan:  1.  Multivessel CAD status post DES intervention to the LAD, RCA, and circumflex OM system in June of last year.  He remains angina free at this time.  We  will plan to continue current regimen except stop Lopressor for now.  2.  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  Lopressor 12.5 mill grams twice daily will be stopped for now.  3.  Mixed hyperlipidemia, continues on Zocor with lab work as per Dr. Nevada Crane.  No change to current dose.  4.  Essential hypertension, blood pressure well controlled today on current regimen.  Current medicines were reviewed with the patient today.   Orders Placed This Encounter  Procedures  . EKG 12-Lead    Disposition: Follow-up in 6 months.  Signed, Satira Sark, MD, [facility] 09/21/2017 10:18 AM    [facility] at [place], [place], [place] [address] Phone: [phone]; Fax: [phone]

## 2017-09-21 ENCOUNTER — Encounter: Payer: Self-pay | Admitting: Cardiology

## 2017-09-21 ENCOUNTER — Ambulatory Visit: Payer: PPO | Admitting: Cardiology

## 2017-09-21 VITALS — BP 122/64 | HR 55 | Ht 69.0 in | Wt 186.0 lb

## 2017-09-21 DIAGNOSIS — I25119 Atherosclerotic heart disease of native coronary artery with unspecified angina pectoris: Secondary | ICD-10-CM | POA: Diagnosis not present

## 2017-09-21 DIAGNOSIS — I10 Essential (primary) hypertension: Secondary | ICD-10-CM | POA: Diagnosis not present

## 2017-09-21 DIAGNOSIS — R001 Bradycardia, unspecified: Secondary | ICD-10-CM

## 2017-09-21 DIAGNOSIS — E782 Mixed hyperlipidemia: Secondary | ICD-10-CM | POA: Diagnosis not present

## 2017-09-21 NOTE — Patient Instructions (Signed)
Medication Instructions:  Your physician has recommended you make the following change in your medication:   STOP Lopressor  Please continue all other medications as prescribed  Labwork: NONE  Testing/Procedures: NONE  Follow-Up: Your physician wants you to follow-up in: [place] DR. Domenic Polite You will receive a reminder letter in the mail two months in advance. If you don't receive a letter, please call our office to schedule the follow-up appointment.  Any Other Special Instructions Will Be Listed Below (If Applicable).  If you need a refill on your cardiac medications before your next appointment, please call your pharmacy.

## 2017-10-01 DIAGNOSIS — H35372 Puckering of macula, left eye: Secondary | ICD-10-CM | POA: Diagnosis not present

## 2017-10-01 DIAGNOSIS — H35371 Puckering of macula, right eye: Secondary | ICD-10-CM | POA: Diagnosis not present

## 2017-10-01 DIAGNOSIS — H353112 Nonexudative age-related macular degeneration, right eye, intermediate dry stage: Secondary | ICD-10-CM | POA: Diagnosis not present

## 2017-10-01 DIAGNOSIS — H353211 Exudative age-related macular degeneration, right eye, with active choroidal neovascularization: Secondary | ICD-10-CM | POA: Diagnosis not present

## 2017-11-12 DIAGNOSIS — H35371 Puckering of macula, right eye: Secondary | ICD-10-CM | POA: Diagnosis not present

## 2017-11-12 DIAGNOSIS — H35372 Puckering of macula, left eye: Secondary | ICD-10-CM | POA: Diagnosis not present

## 2017-11-12 DIAGNOSIS — H3561 Retinal hemorrhage, right eye: Secondary | ICD-10-CM | POA: Diagnosis not present

## 2017-11-12 DIAGNOSIS — H353211 Exudative age-related macular degeneration, right eye, with active choroidal neovascularization: Secondary | ICD-10-CM | POA: Diagnosis not present

## 2017-12-02 DIAGNOSIS — Z23 Encounter for immunization: Secondary | ICD-10-CM | POA: Diagnosis not present

## 2017-12-31 DIAGNOSIS — H3561 Retinal hemorrhage, right eye: Secondary | ICD-10-CM | POA: Diagnosis not present

## 2017-12-31 DIAGNOSIS — H353211 Exudative age-related macular degeneration, right eye, with active choroidal neovascularization: Secondary | ICD-10-CM | POA: Diagnosis not present

## 2017-12-31 DIAGNOSIS — H35371 Puckering of macula, right eye: Secondary | ICD-10-CM | POA: Diagnosis not present

## 2018-02-04 DIAGNOSIS — H353211 Exudative age-related macular degeneration, right eye, with active choroidal neovascularization: Secondary | ICD-10-CM | POA: Diagnosis not present

## 2018-02-04 DIAGNOSIS — H35371 Puckering of macula, right eye: Secondary | ICD-10-CM | POA: Diagnosis not present

## 2018-02-18 DIAGNOSIS — E782 Mixed hyperlipidemia: Secondary | ICD-10-CM | POA: Diagnosis not present

## 2018-02-18 DIAGNOSIS — I10 Essential (primary) hypertension: Secondary | ICD-10-CM | POA: Diagnosis not present

## 2018-03-18 DIAGNOSIS — H353112 Nonexudative age-related macular degeneration, right eye, intermediate dry stage: Secondary | ICD-10-CM | POA: Diagnosis not present

## 2018-03-18 DIAGNOSIS — H353211 Exudative age-related macular degeneration, right eye, with active choroidal neovascularization: Secondary | ICD-10-CM | POA: Diagnosis not present

## 2018-03-18 DIAGNOSIS — H35372 Puckering of macula, left eye: Secondary | ICD-10-CM | POA: Diagnosis not present

## 2018-03-18 DIAGNOSIS — H33102 Unspecified retinoschisis, left eye: Secondary | ICD-10-CM | POA: Diagnosis not present

## 2018-03-18 DIAGNOSIS — H35371 Puckering of macula, right eye: Secondary | ICD-10-CM | POA: Diagnosis not present

## 2018-03-19 NOTE — Progress Notes (Signed)
Cardiology Office Note  Date: 03/22/2018   ID: [name] [name], DOB 12/23/1940, MRN [id number]  PCP: Celene Squibb, MD  Primary Cardiologist: Rozann Lesches, MD   Chief Complaint  Patient presents with  . Coronary Artery Disease    History of Present Illness: [name] [name] is a 78 y.o. male last seen in July 2019.  He is here today for a routine visit.  He states that he has some days where he feels fatigued, but has had no dizziness or syncope.  Also reports no chest pain or nitroglycerin use.  We stopped Lopressor at the last visit due to bradycardia.  He does feel like this made an improvement.  Otherwise his cardiac regimen is stable and outlined below.  He reports compliance and no obvious intolerances.  Also states that he will be seeing Dr. Nevada Crane in the near future with lab work.  Past Medical History:  Diagnosis Date  . Age-related macular degeneration, dry, right eye   . Arthritis   . Coronary atherosclerosis of native coronary artery    PTCA/stent to LAD and diagonal 2001; DES to LAD, DES to RCA, DES x 2 to circumflex/OM1 07/2016  . CVA (cerebral vascular accident) ([place]) 01/2011   Resolved dysarthria and right hemiparesis  . CVA (cerebral vascular accident) ([place]) 06/2014  . Drug intolerance    ACE inhibitor; related to cough   . Essential hypertension, benign   . Hyperlipidemia   . MI (myocardial infarction) ([place]) 2000  . Partial small bowel obstruction ([place]) 12//2012   Resolved spontaneously.  . Refusal of blood transfusions as patient is [name]     Past Surgical History:  Procedure Laterality Date  . APPENDECTOMY    . CATARACT EXTRACTION W/PHACO Right 05/14/2015   Procedure: CATARACT EXTRACTION PHACO AND INTRAOCULAR LENS PLACEMENT RIGHT EYE CDE=6.96;  Surgeon: Williams Che, MD;  Location: [facility];  Service: Ophthalmology;  Laterality: Right;  . COLONOSCOPY W/ BIOPSIES AND POLYPECTOMY  2005  . CORONARY ANGIOPLASTY WITH STENT PLACEMENT  08/21/2016   . CORONARY STENT INTERVENTION N/A 08/21/2016   Procedure: Coronary Stent Intervention;  Surgeon: Burnell Blanks, MD;  Location: [place] [facility];  Service: Cardiovascular;  Laterality: N/A;  . LEFT HEART CATH AND CORONARY ANGIOGRAPHY N/A 08/21/2016   Procedure: Left Heart Cath and Coronary Angiography;  Surgeon: Burnell Blanks, MD;  Location: [place] [facility];  Service: Cardiovascular;  Laterality: N/A;  . TONSILLECTOMY      Current Outpatient Medications  Medication Sig Dispense Refill  . aspirin 81 MG tablet Take 1 tablet (81 mg total) by mouth daily. 30 tablet 11  . Cholecalciferol (VITAMIN D3) 2000 units TABS Take 2,000 Units by mouth daily.    . clopidogrel (PLAVIX) 75 MG tablet Take 75 mg by mouth daily.    . nitroGLYCERIN (NITROSTAT) 0.4 MG SL tablet Place 1 tablet (0.4 mg total) under the tongue every 5 (five) minutes as needed. 25 tablet 3  . polycarbophil (FIBERCON) 625 MG tablet Take 1,250 mg by mouth daily.    [name] Glycol-Propyl Glycol (SYSTANE OP) Place 1 drop into both eyes 3 (three) times daily as needed (Dry Eyes).     . simvastatin (ZOCOR) 40 MG tablet Take 40 mg by mouth every evening.     Marland Kitchen telmisartan (MICARDIS) 80 MG tablet Take 80 mg by mouth daily.     Marland Kitchen UNABLE TO FIND 1 Syringe by Intravitreal route See admin instructions. Patient receives eye injections every  6 to 8 weeks.     No current facility-administered medications for this visit.    Allergies:  Penicillins   Social History: The patient  reports that he quit smoking about 59 years ago. His smoking use included cigarettes. He has a 4.00 pack-year smoking history. He has never used smokeless tobacco. He reports that he does not drink alcohol or use drugs.   ROS:  Please see the history of present illness. Otherwise, complete review of systems is positive for hearing loss.  All other systems are reviewed and negative.   Physical Exam: VS:  BP 139/72   Pulse 61   Ht 5\' 9"  (1.753 m)    Wt 185 lb 3.2 oz (84 kg)   SpO2 98%   BMI 27.35 kg/m , BMI Body mass index is 27.35 kg/m.  Wt Readings from Last 3 Encounters:  03/22/18 185 lb 3.2 oz (84 kg)  09/21/17 186 lb (84.4 kg)  03/17/17 186 lb 6.4 oz (84.6 kg)    General: Elderly male, appears comfortable at rest. HEENT: Conjunctiva and lids normal, oropharynx clear. Neck: Supple, no elevated JVP or carotid bruits, no thyromegaly. Lungs: Clear to auscultation, nonlabored breathing at rest. Cardiac: Regular rate and rhythm, no S3, soft systolic murmur, no pericardial rub. Abdomen: Soft, nontender, bowel sounds present. Extremities: No pitting edema, distal pulses 2+. Skin: Warm and dry. Musculoskeletal: No kyphosis. Neuropsychiatric: Alert and oriented x3, affect grossly appropriate.  ECG: I personally reviewed the tracing from 09/21/2017 which showed sinus bradycardia.  Recent Labwork:  July 2019: Hemoglobin 14.6, platelets 208, BUN 12, creatinine 0.95, potassium 4.6, AST 16, ALT 11, cholesterol 139, triglycerides 107, HDL 34, LDL 84, hemoglobin A1c 6.0  Other Studies Reviewed Today:  Cardiac catheterization and PCI 08/21/2016:  Mid RCA to Dist RCA lesion, 10 %stenosed.  Post intervention, there is a 0% residual stenosis.  A STENT RESOLUTE ONYX 3.0X22 drug eluting stent was successfully placed, and overlaps previously placed stent.  Prox LAD lesion, 90 %stenosed.  Post intervention, there is a 0% residual stenosis.  A STENT RESOLUTE ONYX 1.61W96 drug eluting stent was successfully placed.  Ost 1st Mrg to 1st Mrg lesion, 99 %stenosed.  Post intervention, there is a 0% residual stenosis.  A STENT RESOLUTE ONYX 3.0X12 drug eluting stent was successfully placed.  Ost Cx to Prox Cx lesion, 99 %stenosed.  Post intervention, there is a 0% residual stenosis.  Mid RCA lesion, 99 %stenosed.  Post intervention, there is a 0% residual stenosis.  A STENT RESOLUTE ONYX 3.0X15 drug eluting stent was successfully  placed, and overlaps previously placed stent.  The left ventricular systolic function is normal.  LV end diastolic pressure is normal.  The left ventricular ejection fraction is 55-65% by visual estimate.  There is no mitral valve regurgitation.  Ost 1st Diag to 1st Diag lesion, 60 %stenosed.  RPDA lesion, 30 %stenosed.  Prox RCA lesion, 20 %stenosed.  1. Severe triple vessel CAD 2. Successful PTCA/DES x 1 distal RCA 3. Successful PTCA/DES x 1 proximal LAD 4. Successful PTCA/DES x 2 proximal Circumflex/OM1 5. Normal LV systolic function  Recommendations: DAPT with ASA and Plavix for lifetime. Start beta blocker at low dose. Continue statin.  Assessment and Plan:  1.  Multivessel CAD status post DES to the LAD, RCA, and circumflex/OM system in June 2018.  He reports no active angina symptoms or nitroglycerin use.  2.  Sinus bradycardia, Lopressor discontinued.  He does state that he feels better.  No syncope.  3.  Mixed hyperlipidemia on Zocor.  He will be having follow-up lab work with Dr. Nevada Crane soon.  4.  Essential hypertension, no changes made to current regimen.  Current medicines were reviewed with the patient today.  Disposition: Follow-up in 6 months.  Signed, Satira Sark, MD, [facility] 03/22/2018 9:48 AM    [facility] at [place], [place], [place] [address] Phone: [phone]; Fax: [phone]

## 2018-03-22 ENCOUNTER — Encounter: Payer: Self-pay | Admitting: Cardiology

## 2018-03-22 ENCOUNTER — Ambulatory Visit: Payer: PPO | Admitting: Cardiology

## 2018-03-22 VITALS — BP 139/72 | HR 61 | Ht 69.0 in | Wt 185.2 lb

## 2018-03-22 DIAGNOSIS — E782 Mixed hyperlipidemia: Secondary | ICD-10-CM | POA: Diagnosis not present

## 2018-03-22 DIAGNOSIS — I10 Essential (primary) hypertension: Secondary | ICD-10-CM

## 2018-03-22 DIAGNOSIS — I25119 Atherosclerotic heart disease of native coronary artery with unspecified angina pectoris: Secondary | ICD-10-CM

## 2018-03-22 DIAGNOSIS — R001 Bradycardia, unspecified: Secondary | ICD-10-CM

## 2018-03-22 NOTE — Patient Instructions (Addendum)

## 2018-03-25 ENCOUNTER — Other Ambulatory Visit: Payer: Self-pay | Admitting: Cardiology

## 2018-03-25 DIAGNOSIS — E782 Mixed hyperlipidemia: Secondary | ICD-10-CM | POA: Diagnosis not present

## 2018-03-25 DIAGNOSIS — I10 Essential (primary) hypertension: Secondary | ICD-10-CM | POA: Diagnosis not present

## 2018-03-25 DIAGNOSIS — Z6828 Body mass index (BMI) 28.0-28.9, adult: Secondary | ICD-10-CM | POA: Diagnosis not present

## 2018-03-25 DIAGNOSIS — R7303 Prediabetes: Secondary | ICD-10-CM | POA: Diagnosis not present

## 2018-03-25 DIAGNOSIS — E559 Vitamin D deficiency, unspecified: Secondary | ICD-10-CM | POA: Diagnosis not present

## 2018-03-25 DIAGNOSIS — R7301 Impaired fasting glucose: Secondary | ICD-10-CM | POA: Diagnosis not present

## 2018-03-25 MED ORDER — NITROGLYCERIN 0.4 MG SL SUBL: 0.4000 mg | SUBLINGUAL_TABLET | SUBLINGUAL | 3 refills | Status: DC | PRN

## 2018-03-25 NOTE — Telephone Encounter (Signed)
°*  STAT* If patient is at the pharmacy, call can be transferred to refill team.   1. Which medications need to be refilled? nitroGLYCERIN (NITROSTAT) 0.4 MG SL tablet   2. Which pharmacy/location (including street and city if local pharmacy) is medication to be sent to? [facility] in [place]  3. Do they need a 30 day or 90 day supply?

## 2018-04-01 DIAGNOSIS — M25511 Pain in right shoulder: Secondary | ICD-10-CM | POA: Diagnosis not present

## 2018-04-01 DIAGNOSIS — I10 Essential (primary) hypertension: Secondary | ICD-10-CM | POA: Diagnosis not present

## 2018-04-01 DIAGNOSIS — I2510 Atherosclerotic heart disease of native coronary artery without angina pectoris: Secondary | ICD-10-CM | POA: Diagnosis not present

## 2018-04-01 DIAGNOSIS — R7301 Impaired fasting glucose: Secondary | ICD-10-CM | POA: Diagnosis not present

## 2018-04-01 DIAGNOSIS — E782 Mixed hyperlipidemia: Secondary | ICD-10-CM | POA: Diagnosis not present

## 2018-04-22 DIAGNOSIS — H353211 Exudative age-related macular degeneration, right eye, with active choroidal neovascularization: Secondary | ICD-10-CM | POA: Diagnosis not present

## 2018-04-22 DIAGNOSIS — H3561 Retinal hemorrhage, right eye: Secondary | ICD-10-CM | POA: Diagnosis not present

## 2018-04-22 DIAGNOSIS — H353112 Nonexudative age-related macular degeneration, right eye, intermediate dry stage: Secondary | ICD-10-CM | POA: Diagnosis not present

## 2018-04-22 DIAGNOSIS — H353122 Nonexudative age-related macular degeneration, left eye, intermediate dry stage: Secondary | ICD-10-CM | POA: Diagnosis not present

## 2018-06-03 DIAGNOSIS — H353211 Exudative age-related macular degeneration, right eye, with active choroidal neovascularization: Secondary | ICD-10-CM | POA: Diagnosis not present

## 2018-06-03 DIAGNOSIS — H35371 Puckering of macula, right eye: Secondary | ICD-10-CM | POA: Diagnosis not present

## 2018-06-03 DIAGNOSIS — M79604 Pain in right leg: Secondary | ICD-10-CM | POA: Diagnosis not present

## 2018-06-03 DIAGNOSIS — G4733 Obstructive sleep apnea (adult) (pediatric): Secondary | ICD-10-CM | POA: Diagnosis not present

## 2018-06-03 DIAGNOSIS — H353112 Nonexudative age-related macular degeneration, right eye, intermediate dry stage: Secondary | ICD-10-CM | POA: Diagnosis not present

## 2018-06-03 DIAGNOSIS — M25572 Pain in left ankle and joints of left foot: Secondary | ICD-10-CM | POA: Diagnosis not present

## 2018-06-23 DIAGNOSIS — Z0000 Encounter for general adult medical examination without abnormal findings: Secondary | ICD-10-CM | POA: Diagnosis not present

## 2018-07-15 DIAGNOSIS — H35372 Puckering of macula, left eye: Secondary | ICD-10-CM | POA: Diagnosis not present

## 2018-07-15 DIAGNOSIS — H35371 Puckering of macula, right eye: Secondary | ICD-10-CM | POA: Diagnosis not present

## 2018-07-15 DIAGNOSIS — H353112 Nonexudative age-related macular degeneration, right eye, intermediate dry stage: Secondary | ICD-10-CM | POA: Diagnosis not present

## 2018-07-15 DIAGNOSIS — H353211 Exudative age-related macular degeneration, right eye, with active choroidal neovascularization: Secondary | ICD-10-CM | POA: Diagnosis not present

## 2018-07-15 DIAGNOSIS — H43812 Vitreous degeneration, left eye: Secondary | ICD-10-CM | POA: Diagnosis not present

## 2018-09-02 DIAGNOSIS — H353211 Exudative age-related macular degeneration, right eye, with active choroidal neovascularization: Secondary | ICD-10-CM | POA: Diagnosis not present

## 2018-09-02 DIAGNOSIS — H35372 Puckering of macula, left eye: Secondary | ICD-10-CM | POA: Diagnosis not present

## 2018-09-02 DIAGNOSIS — H353112 Nonexudative age-related macular degeneration, right eye, intermediate dry stage: Secondary | ICD-10-CM | POA: Diagnosis not present

## 2018-09-02 DIAGNOSIS — H35371 Puckering of macula, right eye: Secondary | ICD-10-CM | POA: Diagnosis not present

## 2018-09-27 ENCOUNTER — Ambulatory Visit: Payer: PPO | Admitting: Cardiology

## 2018-09-27 ENCOUNTER — Other Ambulatory Visit: Payer: Self-pay

## 2018-09-27 ENCOUNTER — Encounter: Payer: Self-pay | Admitting: Cardiology

## 2018-09-27 VITALS — BP 130/70 | HR 55 | Temp 98.5°F | Ht 68.0 in | Wt 184.0 lb

## 2018-09-27 DIAGNOSIS — R001 Bradycardia, unspecified: Secondary | ICD-10-CM | POA: Diagnosis not present

## 2018-09-27 DIAGNOSIS — I25119 Atherosclerotic heart disease of native coronary artery with unspecified angina pectoris: Secondary | ICD-10-CM

## 2018-09-27 DIAGNOSIS — E782 Mixed hyperlipidemia: Secondary | ICD-10-CM | POA: Diagnosis not present

## 2018-09-27 DIAGNOSIS — I10 Essential (primary) hypertension: Secondary | ICD-10-CM

## 2018-09-27 NOTE — Progress Notes (Signed)
Cardiology Office Note  Date: 09/27/2018   ID: [name] [name], DOB [birth year], MRN [id number]  PCP:  Celene Squibb, MD  Cardiologist:  Rozann Lesches, MD Electrophysiologist:  None   Chief Complaint  Patient presents with  . Cardiac follow-up    History of Present Illness: [name] [name] is an 78 y.o. male last seen in January.  He presents for a routine follow-up visit.  He states that he has not had any significant angina symptoms or nitroglycerin use since last encounter.  He has not been getting outdoors as much during the pandemic, states that he wears a mask.  He does some yard work in the mornings before it gets hot and humid.  I personally reviewed his ECG today which shows sinus bradycardia with low voltage.  He does not report any dizziness or syncope.  I reviewed his medications which are outlined below.  He reports compliance.  Still has a fresh bottle of nitroglycerin.  Also states that he is due for lab work and physical with Dr. Nevada Crane soon.  Past Medical History:  Diagnosis Date  . Age-related macular degeneration, dry, right eye   . Arthritis   . Coronary atherosclerosis of native coronary artery    PTCA/stent to LAD and diagonal 2001; DES to LAD, DES to RCA, DES x 2 to circumflex/OM1 07/2016  . CVA (cerebral vascular accident) ([place]) 01/2011   Resolved dysarthria and right hemiparesis  . CVA (cerebral vascular accident) ([place]) 06/2014  . Drug intolerance    ACE inhibitor; related to cough   . Essential hypertension, benign   . Hyperlipidemia   . MI (myocardial infarction) ([place]) 2000  . Partial small bowel obstruction ([place]) 12//2012   Resolved spontaneously.  . Refusal of blood transfusions as patient is [name]     Past Surgical History:  Procedure Laterality Date  . APPENDECTOMY    . CATARACT EXTRACTION W/PHACO Right 05/14/2015   Procedure: CATARACT EXTRACTION PHACO AND INTRAOCULAR LENS PLACEMENT RIGHT EYE CDE=6.96;  Surgeon: Williams Che,  MD;  Location: [facility];  Service: Ophthalmology;  Laterality: Right;  . COLONOSCOPY W/ BIOPSIES AND POLYPECTOMY  2005  . CORONARY ANGIOPLASTY WITH STENT PLACEMENT  08/21/2016  . CORONARY STENT INTERVENTION N/A 08/21/2016   Procedure: Coronary Stent Intervention;  Surgeon: Burnell Blanks, MD;  Location: [facility];  Service: Cardiovascular;  Laterality: N/A;  . LEFT HEART CATH AND CORONARY ANGIOGRAPHY N/A 08/21/2016   Procedure: Left Heart Cath and Coronary Angiography;  Surgeon: Burnell Blanks, MD;  Location: [facility];  Service: Cardiovascular;  Laterality: N/A;  . TONSILLECTOMY      Current Outpatient Medications  Medication Sig Dispense Refill  . aspirin 81 MG tablet Take 1 tablet (81 mg total) by mouth daily. 30 tablet 11  . Cholecalciferol (VITAMIN D3) 2000 units TABS Take 2,000 Units by mouth daily.    . clopidogrel (PLAVIX) 75 MG tablet Take 75 mg by mouth daily.    . nitroGLYCERIN (NITROSTAT) 0.4 MG SL tablet Place 1 tablet (0.4 mg total) under the tongue every 5 (five) minutes x 3 doses as needed (if no relief after 3rd dose, proceed to the ED for an evaluation or call 911). 25 tablet 3  . polycarbophil (FIBERCON) 625 MG tablet Take 1,250 mg by mouth daily.    [name] Glycol-Propyl Glycol (SYSTANE OP) Place 1 drop into both eyes 3 (three) times daily as needed (Dry Eyes).     . simvastatin (  ZOCOR) 40 MG tablet Take 40 mg by mouth every evening.     Marland Kitchen telmisartan (MICARDIS) 80 MG tablet Take 80 mg by mouth daily.     Marland Kitchen UNABLE TO FIND 1 Syringe by Intravitreal route See admin instructions. Patient receives eye injections every 6 to 8 weeks.     No current facility-administered medications for this visit.    Allergies:  Penicillins   Social History: The patient  reports that he quit smoking about 59 years ago. His smoking use included cigarettes. He has a 4.00 pack-year smoking history. He has never used smokeless tobacco. He reports that he does  not drink alcohol or use drugs.   ROS:  Please see the history of present illness. Otherwise, complete review of systems is positive for none.  All other systems are reviewed and negative.   Physical Exam: VS:  BP 130/70   Pulse (!) 55   Temp 98.5 F (36.9 C)   Ht 5\' 8"  (1.727 m)   Wt 184 lb (83.5 kg)   SpO2 98%   BMI 27.98 kg/m , BMI Body mass index is 27.98 kg/m.  Wt Readings from Last 3 Encounters:  09/27/18 184 lb (83.5 kg)  03/22/18 185 lb 3.2 oz (84 kg)  09/21/17 186 lb (84.4 kg)    General:  Elderly male, appears comfortable at rest. HEENT: Conjunctiva and lids normal, oropharynx clear. Neck: Supple, no elevated JVP or carotid bruits, no thyromegaly. Lungs: Clear to auscultation, nonlabored breathing at rest. Cardiac: Regular rate and rhythm, no S3, soft systolic murmur. Abdomen: Soft, nontender, bowel sounds present. Extremities: No pitting edema, distal pulses 2+. Skin: Warm and dry. Musculoskeletal: No kyphosis. Neuropsychiatric: Alert and oriented x3, affect grossly appropriate.  ECG:  An ECG dated 09/21/2017 was personally reviewed today and demonstrated:  Sinus bradycardia.  Recent Labwork:  July 2019: Hemoglobin 14.6, platelets 208, BUN 12, creatinine 0.95, potassium 4.6, AST 16, ALT 11, cholesterol 139, triglycerides 107, HDL 34, LDL 84, hemoglobin A1c 6%  Other Studies Reviewed Today:  Cardiac catheterization and PCI 08/21/2016:  Mid RCA to Dist RCA lesion, 10 %stenosed.  Post intervention, there is a 0% residual stenosis.  A STENT RESOLUTE ONYX 3.0X22 drug eluting stent was successfully placed, and overlaps previously placed stent.  Prox LAD lesion, 90 %stenosed.  Post intervention, there is a 0% residual stenosis.  A STENT RESOLUTE ONYX 1.51V61 drug eluting stent was successfully placed.  Ost 1st Mrg to 1st Mrg lesion, 99 %stenosed.  Post intervention, there is a 0% residual stenosis.  A STENT RESOLUTE ONYX 3.0X12 drug eluting stent was  successfully placed.  Ost Cx to Prox Cx lesion, 99 %stenosed.  Post intervention, there is a 0% residual stenosis.  Mid RCA lesion, 99 %stenosed.  Post intervention, there is a 0% residual stenosis.  A STENT RESOLUTE ONYX 3.0X15 drug eluting stent was successfully placed, and overlaps previously placed stent.  The left ventricular systolic function is normal.  LV end diastolic pressure is normal.  The left ventricular ejection fraction is 55-65% by visual estimate.  There is no mitral valve regurgitation.  Ost 1st Diag to 1st Diag lesion, 60 %stenosed.  RPDA lesion, 30 %stenosed.  Prox RCA lesion, 20 %stenosed.  1. Severe triple vessel CAD 2. Successful PTCA/DES x 1 distal RCA 3. Successful PTCA/DES x 1 proximal LAD 4. Successful PTCA/DES x 2 proximal Circumflex/OM1 5. Normal LV systolic function  Recommendations: DAPT with ASA and Plavix for lifetime. Start beta blocker at low dose. Continue statin.  Assessment and Plan:  1.  CAD status post DES to the LAD, RCA, and circumflex OM system in June 2018.  He reports no active angina or nitroglycerin use on medical therapy.  Continue with observation.  ECG reviewed.  2.  Sinus bradycardia, not symptomatic at this time.  Lopressor discontinued.  3.  Mixed hyperlipidemia on Zocor.  He is following up with Dr. Nevada Crane very soon for lab work.  Last LDL was 84.  4.  Essential hypertension, systolic 223.  Continue with current medications.  Medication Adjustments/Labs and Tests Ordered: Current medicines are reviewed at length with the patient today.  Concerns regarding medicines are outlined above.   Tests Ordered: Orders Placed This Encounter  Procedures  . EKG 12-Lead    Medication Changes: No orders of the defined types were placed in this encounter.   Disposition:  Follow up 6 months in the [place] office.  Signed, Satira Sark, MD, [facility] 09/27/2018 10:17 AM    [facility] [place], [place], [place] [address] Phone: [phone]; Fax: [phone]

## 2018-09-27 NOTE — Patient Instructions (Signed)

## 2018-09-29 DIAGNOSIS — E782 Mixed hyperlipidemia: Secondary | ICD-10-CM | POA: Diagnosis not present

## 2018-09-29 DIAGNOSIS — R7301 Impaired fasting glucose: Secondary | ICD-10-CM | POA: Diagnosis not present

## 2018-09-29 DIAGNOSIS — I10 Essential (primary) hypertension: Secondary | ICD-10-CM | POA: Diagnosis not present

## 2018-09-29 DIAGNOSIS — R7303 Prediabetes: Secondary | ICD-10-CM | POA: Diagnosis not present

## 2018-09-29 DIAGNOSIS — E559 Vitamin D deficiency, unspecified: Secondary | ICD-10-CM | POA: Diagnosis not present

## 2018-09-29 DIAGNOSIS — Z125 Encounter for screening for malignant neoplasm of prostate: Secondary | ICD-10-CM | POA: Diagnosis not present

## 2018-10-04 DIAGNOSIS — E782 Mixed hyperlipidemia: Secondary | ICD-10-CM | POA: Diagnosis not present

## 2018-10-04 DIAGNOSIS — H3530 Unspecified macular degeneration: Secondary | ICD-10-CM | POA: Diagnosis not present

## 2018-10-04 DIAGNOSIS — I2510 Atherosclerotic heart disease of native coronary artery without angina pectoris: Secondary | ICD-10-CM | POA: Diagnosis not present

## 2018-10-04 DIAGNOSIS — Z0001 Encounter for general adult medical examination with abnormal findings: Secondary | ICD-10-CM | POA: Diagnosis not present

## 2018-10-04 DIAGNOSIS — I10 Essential (primary) hypertension: Secondary | ICD-10-CM | POA: Diagnosis not present

## 2018-10-04 DIAGNOSIS — R7301 Impaired fasting glucose: Secondary | ICD-10-CM | POA: Diagnosis not present

## 2018-10-05 ENCOUNTER — Encounter: Payer: Self-pay | Admitting: Internal Medicine

## 2018-10-28 DIAGNOSIS — H353112 Nonexudative age-related macular degeneration, right eye, intermediate dry stage: Secondary | ICD-10-CM | POA: Diagnosis not present

## 2018-10-28 DIAGNOSIS — H43311 Vitreous membranes and strands, right eye: Secondary | ICD-10-CM | POA: Diagnosis not present

## 2018-10-28 DIAGNOSIS — H35372 Puckering of macula, left eye: Secondary | ICD-10-CM | POA: Diagnosis not present

## 2018-10-28 DIAGNOSIS — H353211 Exudative age-related macular degeneration, right eye, with active choroidal neovascularization: Secondary | ICD-10-CM | POA: Diagnosis not present

## 2018-11-10 DIAGNOSIS — H26491 Other secondary cataract, right eye: Secondary | ICD-10-CM | POA: Diagnosis not present

## 2018-11-15 DIAGNOSIS — M79605 Pain in left leg: Secondary | ICD-10-CM | POA: Diagnosis not present

## 2018-12-01 DIAGNOSIS — I10 Essential (primary) hypertension: Secondary | ICD-10-CM | POA: Diagnosis not present

## 2018-12-01 DIAGNOSIS — E782 Mixed hyperlipidemia: Secondary | ICD-10-CM | POA: Diagnosis not present

## 2018-12-03 DIAGNOSIS — Z23 Encounter for immunization: Secondary | ICD-10-CM | POA: Diagnosis not present

## 2018-12-15 DIAGNOSIS — H353211 Exudative age-related macular degeneration, right eye, with active choroidal neovascularization: Secondary | ICD-10-CM | POA: Diagnosis not present

## 2018-12-15 DIAGNOSIS — H35371 Puckering of macula, right eye: Secondary | ICD-10-CM | POA: Diagnosis not present

## 2019-01-19 ENCOUNTER — Other Ambulatory Visit: Payer: Self-pay

## 2019-02-01 DIAGNOSIS — H353112 Nonexudative age-related macular degeneration, right eye, intermediate dry stage: Secondary | ICD-10-CM | POA: Diagnosis not present

## 2019-02-01 DIAGNOSIS — H353211 Exudative age-related macular degeneration, right eye, with active choroidal neovascularization: Secondary | ICD-10-CM | POA: Diagnosis not present

## 2019-02-01 DIAGNOSIS — H35371 Puckering of macula, right eye: Secondary | ICD-10-CM | POA: Diagnosis not present

## 2019-02-01 DIAGNOSIS — H43311 Vitreous membranes and strands, right eye: Secondary | ICD-10-CM | POA: Diagnosis not present

## 2019-02-02 DIAGNOSIS — E7849 Other hyperlipidemia: Secondary | ICD-10-CM | POA: Diagnosis not present

## 2019-02-02 DIAGNOSIS — I2510 Atherosclerotic heart disease of native coronary artery without angina pectoris: Secondary | ICD-10-CM | POA: Diagnosis not present

## 2019-02-02 DIAGNOSIS — I10 Essential (primary) hypertension: Secondary | ICD-10-CM | POA: Diagnosis not present

## 2019-02-02 DIAGNOSIS — Z23 Encounter for immunization: Secondary | ICD-10-CM | POA: Diagnosis not present

## 2019-03-14 NOTE — Progress Notes (Signed)
Cardiology Office Note  Date: 03/15/2019   ID: [name] [name], DOB [birth year], MRN [id number]  PCP:  Celene Squibb, MD  Cardiologist:  Rozann Lesches, MD Electrophysiologist:  None   Chief Complaint  Patient presents with  . Cardiac follow-up    History of Present Illness: [name] [name] is an 79 y.o. male last seen in August 2020.  He presents for a routine visit.  Since last assessment, he does not report any significant angina or nitroglycerin use.  Remains functional with ADLs.  He just recently got his Covid vaccine.  I reviewed his medications.  Cardiac regimen includes aspirin, Plavix, Zocor, telmisartan, and as needed nitroglycerin.  We did send in a refill request.  Lab work from August 2020 showed LDL 81.  Past Medical History:  Diagnosis Date  . Age-related macular degeneration, dry, right eye   . Arthritis   . Coronary atherosclerosis of native coronary artery    PTCA/stent to LAD and diagonal 2001; DES to LAD, DES to RCA, DES x 2 to circumflex/OM1 07/2016  . CVA (cerebral vascular accident) ([place]) 01/2011   Resolved dysarthria and right hemiparesis  . CVA (cerebral vascular accident) ([place]) 06/2014  . Drug intolerance    ACE inhibitor; related to cough   . Essential hypertension, benign   . Hyperlipidemia   . MI (myocardial infarction) ([place]) 2000  . Partial small bowel obstruction ([place]) 12//2012   Resolved spontaneously.  . Refusal of blood transfusions as patient is [name]     Past Surgical History:  Procedure Laterality Date  . APPENDECTOMY    . CATARACT EXTRACTION W/PHACO Right 05/14/2015   Procedure: CATARACT EXTRACTION PHACO AND INTRAOCULAR LENS PLACEMENT RIGHT EYE CDE=6.96;  Surgeon: Williams Che, MD;  Location: [facility];  Service: Ophthalmology;  Laterality: Right;  . COLONOSCOPY W/ BIOPSIES AND POLYPECTOMY  2005  . CORONARY ANGIOPLASTY WITH STENT PLACEMENT  08/21/2016  . CORONARY STENT INTERVENTION N/A 08/21/2016   Procedure: Coronary  Stent Intervention;  Surgeon: Burnell Blanks, MD;  Location: [facility];  Service: Cardiovascular;  Laterality: N/A;  . LEFT HEART CATH AND CORONARY ANGIOGRAPHY N/A 08/21/2016   Procedure: Left Heart Cath and Coronary Angiography;  Surgeon: Burnell Blanks, MD;  Location: [facility];  Service: Cardiovascular;  Laterality: N/A;  . TONSILLECTOMY      Current Outpatient Medications  Medication Sig Dispense Refill  . aspirin 81 MG tablet Take 1 tablet (81 mg total) by mouth daily. 30 tablet 11  . Cholecalciferol (VITAMIN D3) 2000 units TABS Take 2,000 Units by mouth daily.    . clopidogrel (PLAVIX) 75 MG tablet Take 75 mg by mouth daily.    . nitroGLYCERIN (NITROSTAT) 0.4 MG SL tablet Place 1 tablet (0.4 mg total) under the tongue every 5 (five) minutes x 3 doses as needed (if no relief after 3rd dose, proceed to the ED for an evaluation or call 911). 25 tablet 3  . polycarbophil (FIBERCON) 625 MG tablet Take 1,250 mg by mouth daily.    [name] Glycol-Propyl Glycol (SYSTANE OP) Place 1 drop into both eyes 3 (three) times daily as needed (Dry Eyes).     . simvastatin (ZOCOR) 40 MG tablet Take 40 mg by mouth every evening.     Marland Kitchen telmisartan (MICARDIS) 80 MG tablet Take 80 mg by mouth daily.     Marland Kitchen UNABLE TO FIND 1 Syringe by Intravitreal route See admin instructions. Patient receives eye injections every 6 to  8 weeks.     No current facility-administered medications for this visit.   Allergies:  Penicillins   Social History: The patient  reports that he quit smoking about 60 years ago. His smoking use included cigarettes. He has a 4.00 pack-year smoking history. He has never used smokeless tobacco. He reports that he does not drink alcohol or use drugs.   ROS:  Please see the history of present illness. Otherwise, complete review of systems is positive for none.  All other systems are reviewed and negative.   Physical Exam: VS:  BP (!) 142/70   Pulse 65   Ht  5\' 9"  (1.753 m)   Wt 184 lb (83.5 kg)   SpO2 98%   BMI 27.17 kg/m , BMI Body mass index is 27.17 kg/m.  Wt Readings from Last 3 Encounters:  03/15/19 184 lb (83.5 kg)  09/27/18 184 lb (83.5 kg)  03/22/18 185 lb 3.2 oz (84 kg)    General: Elderly male, appears comfortable at rest. HEENT: Conjunctiva and lids normal, wearing a mask. Neck: Supple, no elevated JVP or carotid bruits, no thyromegaly. Lungs: Clear to auscultation, nonlabored breathing at rest. Cardiac: Regular rate and rhythm, no S3, soft systolic murmur. Abdomen: Soft, nontender, bowel sounds present. Extremities: No pitting edema, distal pulses 2+. Skin: Warm and dry. Musculoskeletal: No kyphosis. Neuropsychiatric: Alert and oriented x3, affect grossly appropriate.  ECG:  An ECG dated 09/27/2018 was personally reviewed today and demonstrated:  Sinus bradycardia with low voltage.  Recent Labwork:  August 2020: Hemoglobin 14.6, platelets 207, BUN 15, creatinine 0.95, potassium 4.3, AST 14, ALT 11, triglycerides 93, HDL 37, LDL 81, hemoglobin A1c 5.9%  Other Studies Reviewed Today:  Cardiac catheterization and PCI 08/21/2016:  Mid RCA to Dist RCA lesion, 10 %stenosed.  Post intervention, there is a 0% residual stenosis.  A STENT RESOLUTE ONYX 3.0X22 drug eluting stent was successfully placed, and overlaps previously placed stent.  Prox LAD lesion, 90 %stenosed.  Post intervention, there is a 0% residual stenosis.  A STENT RESOLUTE ONYX [id number] drug eluting stent was successfully placed.  Ost 1st Mrg to 1st Mrg lesion, 99 %stenosed.  Post intervention, there is a 0% residual stenosis.  A STENT RESOLUTE ONYX 3.0X12 drug eluting stent was successfully placed.  Ost Cx to Prox Cx lesion, 99 %stenosed.  Post intervention, there is a 0% residual stenosis.  Mid RCA lesion, 99 %stenosed.  Post intervention, there is a 0% residual stenosis.  A STENT RESOLUTE ONYX 3.0X15 drug eluting stent was successfully  placed, and overlaps previously placed stent.  The left ventricular systolic function is normal.  LV end diastolic pressure is normal.  The left ventricular ejection fraction is 55-65% by visual estimate.  There is no mitral valve regurgitation.  Ost 1st Diag to 1st Diag lesion, 60 %stenosed.  RPDA lesion, 30 %stenosed.  Prox RCA lesion, 20 %stenosed.  1. Severe triple vessel CAD 2. Successful PTCA/DES x 1 distal RCA 3. Successful PTCA/DES x 1 proximal LAD 4. Successful PTCA/DES x 2 proximal Circumflex/OM1 5. Normal LV systolic function  Recommendations: DAPT with ASA and Plavix for lifetime. Start beta blocker at low dose. Continue statin.  Assessment and Plan:  1.  CAD status post DES to the LAD, RCA, and circumflex OM system in June 2018.  He remains stable without active angina on medical therapy.  Continue aspirin, Plavix, telmisartan, and Zocor.  Refill provided for fresh bottle of nitroglycerin.  2.  Mixed hyperlipidemia on Zocor.  Last LDL  81.  Continue with current plan.  Medication Adjustments/Labs and Tests Ordered: Current medicines are reviewed at length with the patient today.  Concerns regarding medicines are outlined above.   Tests Ordered: No orders of the defined types were placed in this encounter.   Medication Changes: Meds ordered this encounter  Medications  . nitroGLYCERIN (NITROSTAT) 0.4 MG SL tablet    Sig: Place 1 tablet (0.4 mg total) under the tongue every 5 (five) minutes x 3 doses as needed (if no relief after 3rd dose, proceed to the ED for an evaluation or call 911).    Dispense:  25 tablet    Refill:  3    Disposition:  Follow up 6 months in the [place] office.  Signed, Satira Sark, MD, [facility] 03/15/2019 2:45 PM    [facility] [place], [place], [place] [address] Phone: [phone]; Fax: [phone]

## 2019-03-15 ENCOUNTER — Encounter: Payer: Self-pay | Admitting: Cardiology

## 2019-03-15 ENCOUNTER — Ambulatory Visit: Payer: PPO | Admitting: Cardiology

## 2019-03-15 ENCOUNTER — Other Ambulatory Visit: Payer: Self-pay

## 2019-03-15 VITALS — BP 142/70 | HR 65 | Ht 69.0 in | Wt 184.0 lb

## 2019-03-15 DIAGNOSIS — I25119 Atherosclerotic heart disease of native coronary artery with unspecified angina pectoris: Secondary | ICD-10-CM

## 2019-03-15 DIAGNOSIS — E782 Mixed hyperlipidemia: Secondary | ICD-10-CM | POA: Diagnosis not present

## 2019-03-15 DIAGNOSIS — I10 Essential (primary) hypertension: Secondary | ICD-10-CM | POA: Diagnosis not present

## 2019-03-15 MED ORDER — NITROGLYCERIN 0.4 MG SL SUBL: 0.4000 mg | SUBLINGUAL_TABLET | SUBLINGUAL | 3 refills | Status: DC | PRN

## 2019-03-15 NOTE — Patient Instructions (Addendum)

## 2019-03-22 DIAGNOSIS — H35371 Puckering of macula, right eye: Secondary | ICD-10-CM | POA: Diagnosis not present

## 2019-03-22 DIAGNOSIS — H353211 Exudative age-related macular degeneration, right eye, with active choroidal neovascularization: Secondary | ICD-10-CM | POA: Diagnosis not present

## 2019-03-22 DIAGNOSIS — H43311 Vitreous membranes and strands, right eye: Secondary | ICD-10-CM | POA: Diagnosis not present

## 2019-03-22 DIAGNOSIS — H35372 Puckering of macula, left eye: Secondary | ICD-10-CM | POA: Diagnosis not present

## 2019-03-23 DIAGNOSIS — E7849 Other hyperlipidemia: Secondary | ICD-10-CM | POA: Diagnosis not present

## 2019-03-23 DIAGNOSIS — I10 Essential (primary) hypertension: Secondary | ICD-10-CM | POA: Diagnosis not present

## 2019-03-23 DIAGNOSIS — I2510 Atherosclerotic heart disease of native coronary artery without angina pectoris: Secondary | ICD-10-CM | POA: Diagnosis not present

## 2019-03-23 DIAGNOSIS — Z23 Encounter for immunization: Secondary | ICD-10-CM | POA: Diagnosis not present

## 2019-04-07 DIAGNOSIS — E559 Vitamin D deficiency, unspecified: Secondary | ICD-10-CM | POA: Diagnosis not present

## 2019-04-07 DIAGNOSIS — E782 Mixed hyperlipidemia: Secondary | ICD-10-CM | POA: Diagnosis not present

## 2019-04-07 DIAGNOSIS — I259 Chronic ischemic heart disease, unspecified: Secondary | ICD-10-CM | POA: Diagnosis not present

## 2019-04-07 DIAGNOSIS — H9319 Tinnitus, unspecified ear: Secondary | ICD-10-CM | POA: Diagnosis not present

## 2019-04-07 DIAGNOSIS — I679 Cerebrovascular disease, unspecified: Secondary | ICD-10-CM | POA: Diagnosis not present

## 2019-04-07 DIAGNOSIS — I2510 Atherosclerotic heart disease of native coronary artery without angina pectoris: Secondary | ICD-10-CM | POA: Diagnosis not present

## 2019-04-07 DIAGNOSIS — Z87438 Personal history of other diseases of male genital organs: Secondary | ICD-10-CM | POA: Diagnosis not present

## 2019-04-07 DIAGNOSIS — M720 Palmar fascial fibromatosis [Dupuytren]: Secondary | ICD-10-CM | POA: Diagnosis not present

## 2019-04-07 DIAGNOSIS — I25119 Atherosclerotic heart disease of native coronary artery with unspecified angina pectoris: Secondary | ICD-10-CM | POA: Diagnosis not present

## 2019-04-07 DIAGNOSIS — I10 Essential (primary) hypertension: Secondary | ICD-10-CM | POA: Diagnosis not present

## 2019-04-07 DIAGNOSIS — G459 Transient cerebral ischemic attack, unspecified: Secondary | ICD-10-CM | POA: Diagnosis not present

## 2019-04-07 DIAGNOSIS — R7301 Impaired fasting glucose: Secondary | ICD-10-CM | POA: Diagnosis not present

## 2019-04-11 DIAGNOSIS — R7301 Impaired fasting glucose: Secondary | ICD-10-CM | POA: Diagnosis not present

## 2019-04-11 DIAGNOSIS — I2510 Atherosclerotic heart disease of native coronary artery without angina pectoris: Secondary | ICD-10-CM | POA: Diagnosis not present

## 2019-04-11 DIAGNOSIS — I10 Essential (primary) hypertension: Secondary | ICD-10-CM | POA: Diagnosis not present

## 2019-04-11 DIAGNOSIS — E782 Mixed hyperlipidemia: Secondary | ICD-10-CM | POA: Diagnosis not present

## 2019-04-11 DIAGNOSIS — H3530 Unspecified macular degeneration: Secondary | ICD-10-CM | POA: Diagnosis not present

## 2019-04-21 DIAGNOSIS — I10 Essential (primary) hypertension: Secondary | ICD-10-CM | POA: Diagnosis not present

## 2019-04-21 DIAGNOSIS — I2510 Atherosclerotic heart disease of native coronary artery without angina pectoris: Secondary | ICD-10-CM | POA: Diagnosis not present

## 2019-04-21 DIAGNOSIS — R7301 Impaired fasting glucose: Secondary | ICD-10-CM | POA: Diagnosis not present

## 2019-04-21 DIAGNOSIS — E782 Mixed hyperlipidemia: Secondary | ICD-10-CM | POA: Diagnosis not present

## 2019-04-21 DIAGNOSIS — H3530 Unspecified macular degeneration: Secondary | ICD-10-CM | POA: Diagnosis not present

## 2019-05-05 DIAGNOSIS — H353211 Exudative age-related macular degeneration, right eye, with active choroidal neovascularization: Secondary | ICD-10-CM | POA: Diagnosis not present

## 2019-05-05 DIAGNOSIS — H35371 Puckering of macula, right eye: Secondary | ICD-10-CM | POA: Diagnosis not present

## 2019-05-05 DIAGNOSIS — H43311 Vitreous membranes and strands, right eye: Secondary | ICD-10-CM | POA: Diagnosis not present

## 2019-05-05 DIAGNOSIS — H35372 Puckering of macula, left eye: Secondary | ICD-10-CM | POA: Diagnosis not present

## 2019-05-24 DIAGNOSIS — H35373 Puckering of macula, bilateral: Secondary | ICD-10-CM | POA: Diagnosis not present

## 2019-05-24 DIAGNOSIS — Z961 Presence of intraocular lens: Secondary | ICD-10-CM | POA: Diagnosis not present

## 2019-05-24 DIAGNOSIS — H16223 Keratoconjunctivitis sicca, not specified as Sjogren's, bilateral: Secondary | ICD-10-CM | POA: Diagnosis not present

## 2019-05-24 DIAGNOSIS — H3581 Retinal edema: Secondary | ICD-10-CM | POA: Diagnosis not present

## 2019-05-24 DIAGNOSIS — H353211 Exudative age-related macular degeneration, right eye, with active choroidal neovascularization: Secondary | ICD-10-CM | POA: Diagnosis not present

## 2019-05-24 DIAGNOSIS — H2512 Age-related nuclear cataract, left eye: Secondary | ICD-10-CM | POA: Diagnosis not present

## 2019-05-24 DIAGNOSIS — H353122 Nonexudative age-related macular degeneration, left eye, intermediate dry stage: Secondary | ICD-10-CM | POA: Diagnosis not present

## 2019-05-26 HISTORY — PX: CATARACT EXTRACTION W/PHACO: SHX586

## 2019-06-03 DIAGNOSIS — H25812 Combined forms of age-related cataract, left eye: Secondary | ICD-10-CM | POA: Diagnosis not present

## 2019-06-10 DIAGNOSIS — I10 Essential (primary) hypertension: Secondary | ICD-10-CM | POA: Diagnosis not present

## 2019-06-10 DIAGNOSIS — E7849 Other hyperlipidemia: Secondary | ICD-10-CM | POA: Diagnosis not present

## 2019-06-10 DIAGNOSIS — I2510 Atherosclerotic heart disease of native coronary artery without angina pectoris: Secondary | ICD-10-CM | POA: Diagnosis not present

## 2019-06-10 DIAGNOSIS — Z23 Encounter for immunization: Secondary | ICD-10-CM | POA: Diagnosis not present

## 2019-06-20 ENCOUNTER — Ambulatory Visit (INDEPENDENT_AMBULATORY_CARE_PROVIDER_SITE_OTHER): Payer: PPO | Admitting: Ophthalmology

## 2019-06-20 ENCOUNTER — Other Ambulatory Visit: Payer: Self-pay

## 2019-06-20 ENCOUNTER — Encounter (INDEPENDENT_AMBULATORY_CARE_PROVIDER_SITE_OTHER): Payer: Self-pay | Admitting: Ophthalmology

## 2019-06-20 DIAGNOSIS — H35371 Puckering of macula, right eye: Secondary | ICD-10-CM | POA: Diagnosis not present

## 2019-06-20 DIAGNOSIS — H35372 Puckering of macula, left eye: Secondary | ICD-10-CM | POA: Insufficient documentation

## 2019-06-20 DIAGNOSIS — H43311 Vitreous membranes and strands, right eye: Secondary | ICD-10-CM

## 2019-06-20 DIAGNOSIS — H353211 Exudative age-related macular degeneration, right eye, with active choroidal neovascularization: Secondary | ICD-10-CM | POA: Diagnosis not present

## 2019-06-20 MED ORDER — BEVACIZUMAB CHEMO INJECTION 1.25MG/0.05ML SYRINGE FOR KALEIDOSCOPE
1.2500 mg | INTRAVITREAL | Status: AC | PRN
  Administered 2019-06-20: 12:00:00 1.25 mg via INTRAVITREAL

## 2019-06-20 NOTE — Progress Notes (Signed)
06/20/2019     CHIEF COMPLAINT Patient presents for Retina Follow Up   HISTORY OF PRESENT ILLNESS: [name] [name] is a 79 y.o. male who presents to the clinic today for:   HPI    Retina Follow Up    Patient presents with  Wet AMD.  In right eye.  This started 7 weeks ago.  Severity is mild.  Duration of 7 weeks.  Since onset it is stable.          Comments    7 Week AMD F/U OD, poss Avastin OD  Pt sts he had cataract sx OS x 2 weeks ago with Dr. Wyatt Portela. Pt reports stable VA OD. No new symptoms reported OU.        Last edited by Rockie Neighbours, [place] on 06/20/2019 10:46 AM. (History)      Referring physician: Celene Squibb, MD [place],  [place] [address]  HISTORICAL INFORMATION:   Selected notes from the MEDICAL RECORD NUMBER    Lab Results  Component Value Date   HGBA1C 6.0 (H) 07/14/2014     CURRENT MEDICATIONS: Current Outpatient Medications (Ophthalmic Drugs)  Medication Sig  . Polyethyl Glycol-Propyl Glycol (SYSTANE OP) Place 1 drop into both eyes 3 (three) times daily as needed (Dry Eyes).   Marland Kitchen PROLENSA 0.07 % SOLN Place 1 drop into the left eye daily.   No current facility-administered medications for this visit. (Ophthalmic Drugs)   Current Outpatient Medications (Other)  Medication Sig  . aspirin 81 MG tablet Take 1 tablet (81 mg total) by mouth daily.  . Cholecalciferol (VITAMIN D3) 2000 units TABS Take 2,000 Units by mouth daily.  . clopidogrel (PLAVIX) 75 MG tablet Take 75 mg by mouth daily.  . nitroGLYCERIN (NITROSTAT) 0.4 MG SL tablet Place 1 tablet (0.4 mg total) under the tongue every 5 (five) minutes x 3 doses as needed (if no relief after 3rd dose, proceed to the ED for an evaluation or call 911).  . polycarbophil (FIBERCON) 625 MG tablet Take 1,250 mg by mouth daily.  . simvastatin (ZOCOR) 40 MG tablet Take 40 mg by mouth every evening.   Marland Kitchen telmisartan (MICARDIS) 80 MG tablet Take 80 mg by mouth daily.   Marland Kitchen UNABLE TO FIND 1  Syringe by Intravitreal route See admin instructions. Patient receives eye injections every 6 to 8 weeks.   No current facility-administered medications for this visit. (Other)      REVIEW OF SYSTEMS:    ALLERGIES Allergies  Allergen Reactions  . Penicillins Nausea Only    Has patient had a PCN reaction causing immediate rash, facial/tongue/throat swelling, SOB or lightheadedness with hypotension: No Has patient had a PCN reaction causing severe rash involving mucus membranes or skin necrosis: No Has patient had a PCN reaction that required hospitalization No Has patient had a PCN reaction occurring within the last 10 years: No If all of the above answers are "NO", then may proceed with Cephalosporin use.     PAST MEDICAL HISTORY Past Medical History:  Diagnosis Date  . Age-related macular degeneration, dry, right eye   . Arthritis   . Coronary atherosclerosis of native coronary artery    PTCA/stent to LAD and diagonal 2001; DES to LAD, DES to RCA, DES x 2 to circumflex/OM1 07/2016  . CVA (cerebral vascular accident) ([place]) 01/2011   Resolved dysarthria and right hemiparesis  . CVA (cerebral vascular accident) ([place]) 06/2014  . Drug intolerance    ACE  inhibitor; related to cough   . Essential hypertension, benign   . Hyperlipidemia   . MI (myocardial infarction) ([place]) 2000  . Partial small bowel obstruction ([place]) 12//2012   Resolved spontaneously.  . Refusal of blood transfusions as patient is [name]    Past Surgical History:  Procedure Laterality Date  . APPENDECTOMY    . CATARACT EXTRACTION W/PHACO Right 05/14/2015   Procedure: CATARACT EXTRACTION PHACO AND INTRAOCULAR LENS PLACEMENT RIGHT EYE CDE=6.96;  Surgeon: Williams Che, MD;  Location: [facility];  Service: Ophthalmology;  Laterality: Right;  . CATARACT EXTRACTION W/PHACO Left 05/2019  . COLONOSCOPY W/ BIOPSIES AND POLYPECTOMY  2005  . CORONARY ANGIOPLASTY WITH STENT PLACEMENT  08/21/2016  . CORONARY  STENT INTERVENTION N/A 08/21/2016   Procedure: Coronary Stent Intervention;  Surgeon: Burnell Blanks, MD;  Location: [facility];  Service: Cardiovascular;  Laterality: N/A;  . LEFT HEART CATH AND CORONARY ANGIOGRAPHY N/A 08/21/2016   Procedure: Left Heart Cath and Coronary Angiography;  Surgeon: Burnell Blanks, MD;  Location: [facility];  Service: Cardiovascular;  Laterality: N/A;  . TONSILLECTOMY      FAMILY HISTORY Family History  Problem Relation Age of Onset  . Cancer Father   . Heart failure Father   . Hyperlipidemia Father   . Diabetes Sister   . Hypertension Sister   . Cancer Mother   . Cancer Brother   . Cancer Sister     SOCIAL HISTORY Social History   Tobacco Use  . Smoking status: Former Smoker    Packs/day: 1.00    Years: 4.00    Pack years: 4.00    Types: Cigarettes    Quit date: 10/22/1958    Years since quitting: 60.7  . Smokeless tobacco: Never Used  Substance Use Topics  . Alcohol use: No    Alcohol/week: 0.0 standard drinks  . Drug use: No         OPHTHALMIC EXAM: Base Eye Exam    Visual Acuity (Snellen - Linear)      Right Left   Dist [place] 20/40 20/40 -1   Dist ph [place] 20/40 +2 20/40 +1       Tonometry (Tonopen, 10:53 AM)      Right Left   Pressure 14 14       Pupils      Pupils Dark Light Shape React APD   Right PERRL 2 1 Round Brisk None   Left PERRL 2 1 Round Brisk None       Visual Fields (Counting fingers)      Left Right    Full Full       Extraocular Movement      Right Left    Full Full       Neuro/Psych    Oriented x3: Yes   Mood/Affect: Normal       Dilation    Right eye: 1.0% Mydriacyl, 2.5% Phenylephrine @ 10:53 AM        Slit Lamp and Fundus Exam    External Exam      Right Left   External Normal Normal       Slit Lamp Exam      Right Left   Lids/Lashes Normal Normal   Conjunctiva/Sclera White and quiet White and quiet   Cornea Clear Clear   Anterior Chamber Deep and  quiet Deep and quiet   Iris Round and reactive Round and reactive   Lens Posterior chamber intraocular lens Posterior chamber  intraocular lens   Anterior Vitreous Normal Normal          IMAGING AND PROCEDURES  Imaging and Procedures for 06/20/19           ASSESSMENT/PLAN:  No problem-specific Assessment & Plan notes found for this encounter.      ICD-10-CM   1. Exudative age-related macular degeneration of right eye with active choroidal neovascularization (HCC)  H35.3211 OCT, Retina - OU - Both Eyes  2. Pathologic vitreous membrane, right  H43.311   3. Right epiretinal membrane  H35.371     1.  2.  3.  Ophthalmic Meds Ordered this visit:  No orders of the defined types were placed in this encounter.      No follow-ups on file.  There are no Patient Instructions on file for this visit.   Explained the diagnoses, plan, and follow up with the patient and they expressed understanding.  Patient expressed understanding of the importance of proper follow up care.   Clent Demark Larell Baney M.D. Diseases & Surgery of the Retina and Vitreous Retina & Diabetic [place] 06/20/19     Abbreviations: M myopia (nearsighted); A astigmatism; H hyperopia (farsighted); P presbyopia; Mrx spectacle prescription;  CTL contact lenses; OD right eye; OS left eye; OU both eyes  XT exotropia; ET esotropia; PEK punctate epithelial keratitis; PEE punctate epithelial erosions; DES dry eye syndrome; MGD meibomian gland dysfunction; ATs artificial tears; PFAT's preservative free artificial tears; [place] nuclear sclerotic cataract; PSC posterior subcapsular cataract; ERM epi-retinal membrane; PVD posterior vitreous detachment; RD retinal detachment; DM diabetes mellitus; DR diabetic retinopathy; NPDR non-proliferative diabetic retinopathy; PDR proliferative diabetic retinopathy; CSME clinically significant macular edema; DME diabetic macular edema; dbh dot blot hemorrhages; CWS cotton wool spot; POAG  primary open angle glaucoma; C/D cup-to-disc ratio; HVF humphrey visual field; GVF goldmann visual field; OCT optical coherence tomography; IOP intraocular pressure; BRVO Branch retinal vein occlusion; CRVO central retinal vein occlusion; CRAO central retinal artery occlusion; BRAO branch retinal artery occlusion; RT retinal tear; SB scleral buckle; PPV pars plana vitrectomy; VH Vitreous hemorrhage; PRP panretinal laser photocoagulation; IVK intravitreal kenalog; VMT vitreomacular traction; MH Macular hole;  NVD neovascularization of the disc; NVE neovascularization elsewhere; AREDS age related eye disease study; ARMD age related macular degeneration; POAG primary open angle glaucoma; EBMD epithelial/anterior basement membrane dystrophy; ACIOL anterior chamber intraocular lens; IOL intraocular lens; PCIOL posterior chamber intraocular lens; Phaco/IOL phacoemulsification with intraocular lens placement; [place] photorefractive keratectomy; LASIK laser assisted in situ keratomileusis; HTN hypertension; DM diabetes mellitus; COPD chronic obstructive pulmonary disease

## 2019-08-08 ENCOUNTER — Other Ambulatory Visit: Payer: Self-pay

## 2019-08-08 ENCOUNTER — Ambulatory Visit (INDEPENDENT_AMBULATORY_CARE_PROVIDER_SITE_OTHER): Payer: PPO | Admitting: Ophthalmology

## 2019-08-08 ENCOUNTER — Encounter (INDEPENDENT_AMBULATORY_CARE_PROVIDER_SITE_OTHER): Payer: Self-pay | Admitting: Ophthalmology

## 2019-08-08 DIAGNOSIS — H353211 Exudative age-related macular degeneration, right eye, with active choroidal neovascularization: Secondary | ICD-10-CM

## 2019-08-08 MED ORDER — BEVACIZUMAB CHEMO INJECTION 1.25MG/0.05ML SYRINGE FOR KALEIDOSCOPE
1.2500 mg | INTRAVITREAL | Status: AC | PRN
  Administered 2019-08-08: 1.25 mg via INTRAVITREAL

## 2019-08-08 NOTE — Progress Notes (Signed)
08/08/2019     CHIEF COMPLAINT Patient presents for Retina Follow Up   HISTORY OF PRESENT ILLNESS: [name] [name] is a 79 y.o. male who presents to the clinic today for:   HPI    Retina Follow Up    Patient presents with  Wet AMD.  In right eye.  Duration of 7 weeks.  Since onset it is stable.          Comments    7 wk follow up - OCT OU, Poss Avastin OD Patient denies change in vision and overall has no complaints, except for having more floaters than usual.       Last edited by Gerda Diss on 08/08/2019 10:56 AM. (History)      Referring physician: Celene Squibb, MD [place],  [place] [address]  HISTORICAL INFORMATION:   Selected notes from the MEDICAL RECORD NUMBER    Lab Results  Component Value Date   HGBA1C 6.0 (H) 07/14/2014     CURRENT MEDICATIONS: Current Outpatient Medications (Ophthalmic Drugs)  Medication Sig  . Polyethyl Glycol-Propyl Glycol (SYSTANE OP) Place 1 drop into both eyes 3 (three) times daily as needed (Dry Eyes).   Marland Kitchen PROLENSA 0.07 % SOLN Place 1 drop into the left eye daily.   No current facility-administered medications for this visit. (Ophthalmic Drugs)   Current Outpatient Medications (Other)  Medication Sig  . aspirin 81 MG tablet Take 1 tablet (81 mg total) by mouth daily.  . Cholecalciferol (VITAMIN D3) 2000 units TABS Take 2,000 Units by mouth daily.  . clopidogrel (PLAVIX) 75 MG tablet Take 75 mg by mouth daily.  . nitroGLYCERIN (NITROSTAT) 0.4 MG SL tablet Place 1 tablet (0.4 mg total) under the tongue every 5 (five) minutes x 3 doses as needed (if no relief after 3rd dose, proceed to the ED for an evaluation or call 911).  . polycarbophil (FIBERCON) 625 MG tablet Take 1,250 mg by mouth daily.  . simvastatin (ZOCOR) 40 MG tablet Take 40 mg by mouth every evening.   Marland Kitchen telmisartan (MICARDIS) 80 MG tablet Take 80 mg by mouth daily.   Marland Kitchen UNABLE TO FIND 1 Syringe by Intravitreal route See admin instructions. Patient  receives eye injections every 6 to 8 weeks.   No current facility-administered medications for this visit. (Other)      REVIEW OF SYSTEMS:    ALLERGIES Allergies  Allergen Reactions  . Penicillins Nausea Only    Has patient had a PCN reaction causing immediate rash, facial/tongue/throat swelling, SOB or lightheadedness with hypotension: No Has patient had a PCN reaction causing severe rash involving mucus membranes or skin necrosis: No Has patient had a PCN reaction that required hospitalization No Has patient had a PCN reaction occurring within the last 10 years: No If all of the above answers are "NO", then may proceed with Cephalosporin use.     PAST MEDICAL HISTORY Past Medical History:  Diagnosis Date  . Age-related macular degeneration, dry, right eye   . Arthritis   . Coronary atherosclerosis of native coronary artery    PTCA/stent to LAD and diagonal 2001; DES to LAD, DES to RCA, DES x 2 to circumflex/OM1 07/2016  . CVA (cerebral vascular accident) ([place]) 01/2011   Resolved dysarthria and right hemiparesis  . CVA (cerebral vascular accident) ([place]) 06/2014  . Drug intolerance    ACE inhibitor; related to cough   . Essential hypertension, benign   . Hyperlipidemia   . MI (myocardial  infarction) ([place]) 2000  . Partial small bowel obstruction ([place]) 12//2012   Resolved spontaneously.  . Refusal of blood transfusions as patient is [name]    Past Surgical History:  Procedure Laterality Date  . APPENDECTOMY    . CATARACT EXTRACTION W/PHACO Right 05/14/2015   Procedure: CATARACT EXTRACTION PHACO AND INTRAOCULAR LENS PLACEMENT RIGHT EYE CDE=6.96;  Surgeon: Williams Che, MD;  Location: [facility];  Service: Ophthalmology;  Laterality: Right;  . CATARACT EXTRACTION W/PHACO Left 05/2019  . COLONOSCOPY W/ BIOPSIES AND POLYPECTOMY  2005  . CORONARY ANGIOPLASTY WITH STENT PLACEMENT  08/21/2016  . CORONARY STENT INTERVENTION N/A 08/21/2016   Procedure: Coronary Stent  Intervention;  Surgeon: Burnell Blanks, MD;  Location: [facility];  Service: Cardiovascular;  Laterality: N/A;  . LEFT HEART CATH AND CORONARY ANGIOGRAPHY N/A 08/21/2016   Procedure: Left Heart Cath and Coronary Angiography;  Surgeon: Burnell Blanks, MD;  Location: [facility];  Service: Cardiovascular;  Laterality: N/A;  . TONSILLECTOMY      FAMILY HISTORY Family History  Problem Relation Age of Onset  . Cancer Father   . Heart failure Father   . Hyperlipidemia Father   . Diabetes Sister   . Hypertension Sister   . Cancer Mother   . Cancer Brother   . Cancer Sister     SOCIAL HISTORY Social History   Tobacco Use  . Smoking status: Former Smoker    Packs/day: 1.00    Years: 4.00    Pack years: 4.00    Types: Cigarettes    Quit date: 10/22/1958    Years since quitting: 60.8  . Smokeless tobacco: Never Used  Vaping Use  . Vaping Use: Never used  Substance Use Topics  . Alcohol use: No    Alcohol/week: 0.0 standard drinks  . Drug use: No         OPHTHALMIC EXAM:  Base Eye Exam    Visual Acuity (Snellen - Linear)      Right Left   Dist [place] 20/50+2 20/25-2   Dist ph [place] 20/40-1        Tonometry (Tonopen, 10:59 AM)      Right Left   Pressure 16 14       Visual Fields (Counting fingers)      Left Right    Full Full       Neuro/Psych    Oriented x3: Yes   Mood/Affect: Normal       Dilation    Right eye: 1.0% Mydriacyl, 2.5% Phenylephrine @ 10:59 AM        Slit Lamp and Fundus Exam    External Exam      Right Left   External Normal Normal       Slit Lamp Exam      Right Left   Lids/Lashes Normal Normal   Conjunctiva/Sclera White and quiet White and quiet   Cornea Clear Clear   Anterior Chamber Deep and quiet Deep and quiet   Iris Round and reactive Round and reactive   Lens Posterior chamber intraocular lens Posterior chamber intraocular lens   Anterior Vitreous Normal Normal       Fundus Exam      Right Left    Posterior Vitreous Normal    Disc Normal    C/D Ratio 0.05    Macula Macular thickening, nasal to FAZ, subretinal neovascular membrane    Vessels Normal    Periphery Normal  IMAGING AND PROCEDURES  Imaging and Procedures for 08/08/19  OCT, Retina - OU - Both Eyes       Right Eye Quality was good. Scan locations included subfoveal. Central Foveal Thickness: 369. Progression has improved. Findings include subretinal fluid.   Left Eye Quality was good. Scan locations included subfoveal. Central Foveal Thickness: 490. Progression has improved. Findings include cystoid macular edema, epiretinal membrane.   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       Intravitreal Injection, Pharmacologic Agent - OD - Right Eye       Time Out 08/08/2019. 11:37 AM. Confirmed correct patient, procedure, site, and patient consented.   Anesthesia Topical anesthesia was used. Anesthetic medications included [place] 3.5%.   Procedure Preparation included 10% betadine to eyelids, Ofloxacin .   Injection:  1.25 mg Bevacizumab (AVASTIN) SOLN   NDC: [address]-3, Lot: [id number]   Route: Intravitreal, Site: Right Eye, Waste: 0 mg  Post-op Post injection exam found visual acuity of at least counting fingers. The patient tolerated the procedure well. There were no complications. The patient received written and verbal post procedure care education. Post injection medications were not given.                 ASSESSMENT/PLAN:  Exudative age-related macular degeneration of right eye with active choroidal neovascularization (HCC) Wet ARMD OD nasal to the foveal avascular zone.  This condition is improving currently at 7-week interval.  Since today and examination repeat in 8 weeks      ICD-10-CM   1. 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  2.  3.  Ophthalmic Meds Ordered this visit:  Meds ordered this encounter  Medications  . Bevacizumab (AVASTIN) SOLN 1.25 mg       Return in about 8 weeks (around 10/03/2019) for OD, AVASTIN OCT, dilate.  There are no Patient Instructions on file for this visit.   Explained the diagnoses, plan, and follow up with the patient and they expressed understanding.  Patient expressed understanding of the importance of proper follow up care.   Clent Demark Makisha Marrin M.D. Diseases & Surgery of the Retina and Vitreous Retina & Diabetic [place] 08/08/19     Abbreviations: M myopia (nearsighted); A astigmatism; H hyperopia (farsighted); P presbyopia; Mrx spectacle prescription;  CTL contact lenses; OD right eye; OS left eye; OU both eyes  XT exotropia; ET esotropia; PEK punctate epithelial keratitis; PEE punctate epithelial erosions; DES dry eye syndrome; MGD meibomian gland dysfunction; ATs artificial tears; PFAT's preservative free artificial tears; [place] nuclear sclerotic cataract; PSC posterior subcapsular cataract; ERM epi-retinal membrane; PVD posterior vitreous detachment; RD retinal detachment; DM diabetes mellitus; DR diabetic retinopathy; NPDR non-proliferative diabetic retinopathy; PDR proliferative diabetic retinopathy; CSME clinically significant macular edema; DME diabetic macular edema; dbh dot blot hemorrhages; CWS cotton wool spot; POAG primary open angle glaucoma; C/D cup-to-disc ratio; HVF humphrey visual field; GVF goldmann visual field; OCT optical coherence tomography; IOP intraocular pressure; BRVO Branch retinal vein occlusion; CRVO central retinal vein occlusion; CRAO central retinal  artery occlusion; BRAO branch retinal artery occlusion; RT retinal tear;  SB scleral buckle; PPV pars plana vitrectomy; VH Vitreous hemorrhage; PRP panretinal laser photocoagulation; IVK intravitreal kenalog; VMT vitreomacular traction; MH Macular hole;  NVD neovascularization of the disc; NVE neovascularization elsewhere; AREDS age related eye disease study; ARMD age related macular degeneration; POAG primary open angle glaucoma; EBMD epithelial/anterior basement membrane dystrophy; ACIOL anterior chamber intraocular lens; IOL intraocular lens; PCIOL posterior chamber intraocular lens; Phaco/IOL phacoemulsification with intraocular lens placement; PRK photorefractive keratectomy; LASIK laser assisted in situ keratomileusis; HTN hypertension; DM diabetes mellitus; COPD chronic obstructive pulmonary disease 

## 2019-08-08 NOTE — Assessment & Plan Note (Signed)
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  This condition is improving currently at 7-week interval.  Since today and examination repeat in 8 weeks

## 2019-09-13 ENCOUNTER — Ambulatory Visit: Payer: PPO | Admitting: Cardiology

## 2019-09-13 ENCOUNTER — Encounter: Payer: Self-pay | Admitting: Cardiology

## 2019-09-13 VITALS — BP 130/70 | HR 64 | Ht 69.0 in | Wt 185.6 lb

## 2019-09-13 DIAGNOSIS — E782 Mixed hyperlipidemia: Secondary | ICD-10-CM

## 2019-09-13 DIAGNOSIS — I25119 Atherosclerotic heart disease of native coronary artery with unspecified angina pectoris: Secondary | ICD-10-CM | POA: Diagnosis not present

## 2019-09-13 NOTE — Patient Instructions (Addendum)

## 2019-09-13 NOTE — Progress Notes (Signed)
Cardiology Office Note  Date: 09/13/2019   ID: [name] [name], DOB 1941-02-05, MRN [id number]  PCP:  Celene Squibb, MD  Cardiologist:  Rozann Lesches, MD Electrophysiologist:  None   Chief Complaint  Patient presents with  . Cardiac follow-up    History of Present Illness: [name] [name] is an 79 y.o. male last seen in January.  He presents for a routine visit.  Since last evaluation he does not report any progressive angina symptoms or nitroglycerin use.  He has been doing some walking, not as consistent as in the past.  He reports some instability in his right knee joint.  I reviewed his cardiac medications which are stable and outlined below.  He has had no intolerances.  Blood pressure is better controlled today compared to last visit.  I reviewed his interval lab work from February as outlined below.  LDL was 88.  I personally reviewed his ECG today which shows sinus rhythm with lead motion artifact.  Past Medical History:  Diagnosis Date  . Age-related macular degeneration, dry, right eye   . Arthritis   . Coronary atherosclerosis of native coronary artery    PTCA/stent to LAD and diagonal 2001; DES to LAD, DES to RCA, DES x 2 to circumflex/OM1 07/2016  . CVA (cerebral vascular accident) ([place]) 01/2011   Resolved dysarthria and right hemiparesis  . CVA (cerebral vascular accident) ([place]) 06/2014  . Drug intolerance    ACE inhibitor; related to cough   . Essential hypertension   . Hyperlipidemia   . MI (myocardial infarction) ([place]) 2000  . Partial small bowel obstruction ([place]) 12//2012   Resolved spontaneously.  . Refusal of blood transfusions as patient is [name]     Past Surgical History:  Procedure Laterality Date  . APPENDECTOMY    . CATARACT EXTRACTION W/PHACO Right 05/14/2015   Procedure: CATARACT EXTRACTION PHACO AND INTRAOCULAR LENS PLACEMENT RIGHT EYE CDE=6.96;  Surgeon: Williams Che, MD;  Location: [facility];  Service: Ophthalmology;   Laterality: Right;  . CATARACT EXTRACTION W/PHACO Left 05/2019  . COLONOSCOPY W/ BIOPSIES AND POLYPECTOMY  2005  . CORONARY ANGIOPLASTY WITH STENT PLACEMENT  08/21/2016  . CORONARY STENT INTERVENTION N/A 08/21/2016   Procedure: Coronary Stent Intervention;  Surgeon: Burnell Blanks, MD;  Location: [place] [facility];  Service: Cardiovascular;  Laterality: N/A;  . LEFT HEART CATH AND CORONARY ANGIOGRAPHY N/A 08/21/2016   Procedure: Left Heart Cath and Coronary Angiography;  Surgeon: Burnell Blanks, MD;  Location: [place] [facility];  Service: Cardiovascular;  Laterality: N/A;  . TONSILLECTOMY      Current Outpatient Medications  Medication Sig Dispense Refill  . aspirin 81 MG tablet Take 1 tablet (81 mg total) by mouth daily. 30 tablet 11  . Cholecalciferol (VITAMIN D3) 2000 units TABS Take 2,000 Units by mouth daily.    . clopidogrel (PLAVIX) 75 MG tablet Take 75 mg by mouth daily.    . nitroGLYCERIN (NITROSTAT) 0.4 MG SL tablet Place 1 tablet (0.4 mg total) under the tongue every 5 (five) minutes x 3 doses as needed (if no relief after 3rd dose, proceed to the ED for an evaluation or call 911). 25 tablet 3  . polycarbophil (FIBERCON) 625 MG tablet Take 1,250 mg by mouth daily.    [name] Glycol-Propyl Glycol (SYSTANE OP) Place 1 drop into both eyes 3 (three) times daily as needed (Dry Eyes).     Marland Kitchen PROLENSA 0.07 % SOLN Place 1 drop into  the left eye daily.    . simvastatin (ZOCOR) 40 MG tablet Take 40 mg by mouth every evening.     Marland Kitchen telmisartan (MICARDIS) 80 MG tablet Take 80 mg by mouth daily.     Marland Kitchen UNABLE TO FIND 1 Syringe by Intravitreal route See admin instructions. Patient receives eye injections every 6 to 8 weeks.     No current facility-administered medications for this visit.   Allergies:  Penicillins   ROS:  No palpitations or syncope.  Physical Exam: VS:  BP 130/70   Pulse 64   Ht 5\' 9"  (1.753 m)   Wt 185 lb 9.6 oz (84.2 kg)   SpO2 96%   BMI 27.41  kg/m , BMI Body mass index is 27.41 kg/m.  Wt Readings from Last 3 Encounters:  09/13/19 185 lb 9.6 oz (84.2 kg)  03/15/19 184 lb (83.5 kg)  09/27/18 184 lb (83.5 kg)    General: Patient appears comfortable at rest. HEENT: Conjunctiva and lids normal, oropharynx clear with moist mucosa. Neck: Supple, no elevated JVP or carotid bruits, no thyromegaly. Lungs: Clear to auscultation, nonlabored breathing at rest. Cardiac: Regular rate and rhythm, no S3 or significant systolic murmur, no pericardial rub. Abdomen: Soft, nontender, no hepatomegaly, bowel sounds present, no guarding or rebound. Extremities: No pitting edema, distal pulses 2+. Skin: Warm and dry. Musculoskeletal: No kyphosis. Neuropsychiatric: Alert and oriented x3, affect grossly appropriate.  ECG:  An ECG dated 09/27/2018 was personally reviewed today and demonstrated:  Sinus bradycardia with low voltage.  Recent Labwork:  February 2021: Hemoglobin 14.6, platelets 210, BUN 19, creatinine 0.89, potassium 4.3, AST 13, ALT 13, cholesterol 142, triglycerides 90, HDL 37, LDL 88, hemoglobin A1c 6.1%  Other Studies Reviewed Today:  Cardiac catheterization and PCI 08/21/2016:  Mid RCA to Dist RCA lesion, 10 %stenosed.  Post intervention, there is a 0% residual stenosis.  A STENT RESOLUTE ONYX 3.0X22 drug eluting stent was successfully placed, and overlaps previously placed stent.  Prox LAD lesion, 90 %stenosed.  Post intervention, there is a 0% residual stenosis.  A STENT RESOLUTE ONYX 0.62B76 drug eluting stent was successfully placed.  Ost 1st Mrg to 1st Mrg lesion, 99 %stenosed.  Post intervention, there is a 0% residual stenosis.  A STENT RESOLUTE ONYX 3.0X12 drug eluting stent was successfully placed.  Ost Cx to Prox Cx lesion, 99 %stenosed.  Post intervention, there is a 0% residual stenosis.  Mid RCA lesion, 99 %stenosed.  Post intervention, there is a 0% residual stenosis.  A STENT RESOLUTE ONYX 3.0X15  drug eluting stent was successfully placed, and overlaps previously placed stent.  The left ventricular systolic function is normal.  LV end diastolic pressure is normal.  The left ventricular ejection fraction is 55-65% by visual estimate.  There is no mitral valve regurgitation.  Ost 1st Diag to 1st Diag lesion, 60 %stenosed.  RPDA lesion, 30 %stenosed.  Prox RCA lesion, 20 %stenosed.  1. Severe triple vessel CAD 2. Successful PTCA/DES x 1 distal RCA 3. Successful PTCA/DES x 1 proximal LAD 4. Successful PTCA/DES x 2 proximal Circumflex/OM1 5. Normal LV systolic function  Recommendations: DAPT with ASA and Plavix for lifetime. Start beta blocker at low dose. Continue statin.  Assessment and Plan:  1.  CAD status post DES to the LAD, RCA, and circumflex OM territory in June 2018.  He continues on anticipated lifelong DAPT, does report intermittent bruising but no major bleeding problems.  He does not describe any active angina, ECG reviewed.  Continue  ARB and statin therapy as well.  2.  Continue on Zocor.  Recent LDL 88.  No changes were made today.  Medication Adjustments/Labs and Tests Ordered: Current medicines are reviewed at length with the patient today.  Concerns regarding medicines are outlined above.   Tests Ordered: Orders Placed This Encounter  Procedures  . EKG 12-Lead    Medication Changes: No orders of the defined types were placed in this encounter.   Disposition:  Follow up 6 months in the [place] office.  Signed, Satira Sark, MD, [facility] 09/13/2019 1:22 PM    [facility], [place], [place] [address] Phone: [phone]; Fax: [phone]

## 2019-09-14 DIAGNOSIS — L570 Actinic keratosis: Secondary | ICD-10-CM | POA: Diagnosis not present

## 2019-09-14 DIAGNOSIS — X32XXXD Exposure to sunlight, subsequent encounter: Secondary | ICD-10-CM | POA: Diagnosis not present

## 2019-10-03 ENCOUNTER — Encounter (INDEPENDENT_AMBULATORY_CARE_PROVIDER_SITE_OTHER): Payer: Self-pay | Admitting: Ophthalmology

## 2019-10-03 ENCOUNTER — Ambulatory Visit (INDEPENDENT_AMBULATORY_CARE_PROVIDER_SITE_OTHER): Payer: PPO | Admitting: Ophthalmology

## 2019-10-03 ENCOUNTER — Other Ambulatory Visit: Payer: Self-pay

## 2019-10-03 DIAGNOSIS — H353211 Exudative age-related macular degeneration, right eye, with active choroidal neovascularization: Secondary | ICD-10-CM

## 2019-10-03 DIAGNOSIS — H35372 Puckering of macula, left eye: Secondary | ICD-10-CM

## 2019-10-03 MED ORDER — BEVACIZUMAB CHEMO INJECTION 1.25MG/0.05ML SYRINGE FOR KALEIDOSCOPE
1.2500 mg | INTRAVITREAL | Status: AC | PRN
Start: 2019-10-03 — End: 2019-10-03
  Administered 2019-10-03: 1.25 mg via INTRAVITREAL

## 2019-10-03 NOTE — Assessment & Plan Note (Signed)
Good acuity OS today with severe epiretinal membrane will continue to observe

## 2019-10-03 NOTE — Assessment & Plan Note (Signed)
OD, improved and stable subretinal fluid around pigment epithelial detachment nasal to the fovea at 8-week interval post Avastin.  We will repeat intravitreal Avastin OD today and examination in 8 weeks

## 2019-10-03 NOTE — Progress Notes (Signed)
10/03/2019     CHIEF COMPLAINT Patient presents for Retina Follow Up   HISTORY OF PRESENT ILLNESS: [name] [name] is a 79 y.o. male who presents to the clinic today for:   HPI    Retina Follow Up    Patient presents with  Wet AMD.  In right eye.  Duration of 8 weeks.  Since onset it is stable.          Comments    8 week follow up - OCT OU, poss Avastin OD Patient denies change in vision and overall has no complaints.        Last edited by Gerda Diss on 10/03/2019 10:03 AM. (History)      Referring physician: Celene Squibb, MD [place],  [place] [address]  HISTORICAL INFORMATION:   Selected notes from the MEDICAL RECORD NUMBER    Lab Results  Component Value Date   HGBA1C 6.0 (H) 07/14/2014     CURRENT MEDICATIONS: Current Outpatient Medications (Ophthalmic Drugs)  Medication Sig  . Polyethyl Glycol-Propyl Glycol (SYSTANE OP) Place 1 drop into both eyes 3 (three) times daily as needed (Dry Eyes).   Marland Kitchen PROLENSA 0.07 % SOLN Place 1 drop into the left eye daily.   No current facility-administered medications for this visit. (Ophthalmic Drugs)   Current Outpatient Medications (Other)  Medication Sig  . aspirin 81 MG tablet Take 1 tablet (81 mg total) by mouth daily.  . Cholecalciferol (VITAMIN D3) 2000 units TABS Take 2,000 Units by mouth daily.  . clopidogrel (PLAVIX) 75 MG tablet Take 75 mg by mouth daily.  . nitroGLYCERIN (NITROSTAT) 0.4 MG SL tablet Place 1 tablet (0.4 mg total) under the tongue every 5 (five) minutes x 3 doses as needed (if no relief after 3rd dose, proceed to the ED for an evaluation or call 911).  . polycarbophil (FIBERCON) 625 MG tablet Take 1,250 mg by mouth daily.  . simvastatin (ZOCOR) 40 MG tablet Take 40 mg by mouth every evening.   Marland Kitchen telmisartan (MICARDIS) 80 MG tablet Take 80 mg by mouth daily.   Marland Kitchen UNABLE TO FIND 1 Syringe by Intravitreal route See admin instructions. Patient receives eye injections every 6 to 8  weeks.   No current facility-administered medications for this visit. (Other)      REVIEW OF SYSTEMS:    ALLERGIES Allergies  Allergen Reactions  . Penicillins Nausea Only    Has patient had a PCN reaction causing immediate rash, facial/tongue/throat swelling, SOB or lightheadedness with hypotension: No Has patient had a PCN reaction causing severe rash involving mucus membranes or skin necrosis: No Has patient had a PCN reaction that required hospitalization No Has patient had a PCN reaction occurring within the last 10 years: No If all of the above answers are "NO", then may proceed with Cephalosporin use.     PAST MEDICAL HISTORY Past Medical History:  Diagnosis Date  . Age-related macular degeneration, dry, right eye   . Arthritis   . Coronary atherosclerosis of native coronary artery    PTCA/stent to LAD and diagonal 2001; DES to LAD, DES to RCA, DES x 2 to circumflex/OM1 07/2016  . CVA (cerebral vascular accident) ([place]) 01/2011   Resolved dysarthria and right hemiparesis  . CVA (cerebral vascular accident) ([place]) 06/2014  . Drug intolerance    ACE inhibitor; related to cough   . Essential hypertension   . Hyperlipidemia   . MI (myocardial infarction) ([place]) 2000  . Partial small  bowel obstruction ([place]) 12//2012   Resolved spontaneously.  . Refusal of blood transfusions as patient is [name]    Past Surgical History:  Procedure Laterality Date  . APPENDECTOMY    . CATARACT EXTRACTION W/PHACO Right 05/14/2015   Procedure: CATARACT EXTRACTION PHACO AND INTRAOCULAR LENS PLACEMENT RIGHT EYE CDE=6.96;  Surgeon: [place] Che, MD;  Location: [facility];  Service: Ophthalmology;  Laterality: Right;  . CATARACT EXTRACTION W/PHACO Left 05/2019  . COLONOSCOPY W/ BIOPSIES AND POLYPECTOMY  2005  . CORONARY ANGIOPLASTY WITH STENT PLACEMENT  08/21/2016  . CORONARY STENT INTERVENTION N/A 08/21/2016   Procedure: Coronary Stent Intervention;  Surgeon: Burnell Blanks, MD;  Location: [place] [facility];  Service: Cardiovascular;  Laterality: N/A;  . LEFT HEART CATH AND CORONARY ANGIOGRAPHY N/A 08/21/2016   Procedure: Left Heart Cath and Coronary Angiography;  Surgeon: Burnell Blanks, MD;  Location: [facility] [facility];  Service: Cardiovascular;  Laterality: N/A;  . TONSILLECTOMY      FAMILY HISTORY Family History  Problem Relation Age of Onset  . Cancer Father   . Heart failure Father   . Hyperlipidemia Father   . Diabetes Sister   . Hypertension Sister   . Cancer Mother   . Cancer Brother   . Cancer Sister     SOCIAL HISTORY Social History   Tobacco Use  . Smoking status: Former Smoker    Packs/day: 1.00    Years: 4.00    Pack years: 4.00    Types: Cigarettes    Quit date: 10/22/1958    Years since quitting: 60.9  . Smokeless tobacco: Never Used  Vaping Use  . Vaping Use: Never used  Substance Use Topics  . Alcohol use: No    Alcohol/week: 0.0 standard drinks  . Drug use: No         OPHTHALMIC EXAM: Base Eye Exam    Visual Acuity (Snellen - Linear)      Right Left   Dist [place] 20/40-2 20/25-2   Dist ph [place] 20/30-2        Tonometry (Tonopen, 10:10 AM)      Right Left   Pressure 15 14       Pupils      Pupils Dark Light Shape React APD   Right PERRL 2 2 Round Minimal None   Left PERRL 2 2 Round Minimal None       Visual Fields (Counting fingers)      Left Right    Full Full       Extraocular Movement      Right Left    Full Full       Neuro/Psych    Oriented x3: Yes   Mood/Affect: Normal       Dilation    Right eye: 1.0% Mydriacyl, 2.5% Phenylephrine @ 10:10 AM        Slit Lamp and Fundus Exam    External Exam      Right Left   External Normal Normal       Slit Lamp Exam      Right Left   Lids/Lashes Normal Normal   Conjunctiva/Sclera White and quiet White and quiet   Cornea Clear Clear   Anterior Chamber Deep and quiet Deep and quiet   Iris Round and reactive Round and reactive    Lens Posterior chamber intraocular lens Posterior chamber intraocular lens   Anterior Vitreous Normal Normal       Fundus Exam  Right Left   Posterior Vitreous Normal    Disc Normal    C/D Ratio 0.05    Macula Macular thickening, nasal to FAZ, subretinal neovascular membrane    Vessels Normal    Periphery Normal           IMAGING AND PROCEDURES  Imaging and Procedures for 10/03/19  OCT, Retina - OU - Both Eyes       Right Eye Quality was good. Scan locations included subfoveal. Central Foveal Thickness: 372. Progression has been stable. Findings include abnormal foveal contour, subretinal fluid, pigment epithelial detachment.   Left Eye Quality was good. Scan locations included subfoveal. Central Foveal Thickness: 485. Findings include abnormal foveal contour, epiretinal membrane.   Notes OD Subretinal fluid and pigment epithelial detachment remained stable at 8-week interval nasal to the foveal region, no extension.  OS, epiretinal membrane, stable with good acuity will observe       Intravitreal Injection, Pharmacologic Agent - OD - Right Eye       Time Out 10/03/2019. 10:32 AM. Confirmed correct patient, procedure, site, and patient consented.   Anesthesia Topical anesthesia was used. Anesthetic medications included Akten 3.5%.   Procedure Preparation included 5% betadine to ocular surface, 10% betadine to eyelids, Ofloxacin . A 30 gauge needle was used.   Injection:  1.25 mg Bevacizumab (AVASTIN) SOLN   NDC: [address]-1, Lot: [id number]   Route: Intravitreal, Site: Right Eye, Waste: 0 mg  Post-op Post injection exam found visual acuity of at least counting fingers. The patient tolerated the procedure well. There were no complications. The patient received written and verbal post procedure care education. Post injection medications were not given.                 ASSESSMENT/PLAN:  Exudative age-related macular degeneration of right eye with active  choroidal neovascularization (HCC) OD, improved and stable subretinal fluid around pigment epithelial detachment nasal to the fovea at 8-week interval post Avastin.  We will repeat intravitreal Avastin OD today and examination in 8 weeks  Left epiretinal membrane Good acuity OS today with severe epiretinal membrane will continue to observe      ICD-10-CM   1. Exudative age-related macular degeneration of right eye with active choroidal neovascularization (HCC)  H35.3211 OCT, Retina - OU - Both Eyes    Intravitreal Injection, Pharmacologic Agent - OD - Right Eye    Bevacizumab (AVASTIN) SOLN 1.25 mg  2. Left epiretinal membrane  H35.372     1.  Repeat intravitreal Avastin OD today  2.  Repeat examination OD in 8 weeks  3.  Ophthalmic Meds Ordered this visit:  Meds ordered this encounter  Medications  . Bevacizumab (AVASTIN) SOLN 1.25 mg       Return in about 8 weeks (around 11/28/2019) for dilate, OD, AVASTIN OCT.  There are no Patient Instructions on file for this visit.   Explained the diagnoses, plan, and follow up with the patient and they expressed understanding.  Patient expressed understanding of the importance of proper follow up care.   Clent Demark Arye Weyenberg M.D. Diseases & Surgery of the Retina and Vitreous [facility] Diabetic [place] 10/03/19     Abbreviations: M myopia (nearsighted); A astigmatism; H hyperopia (farsighted); P presbyopia; Mrx spectacle prescription;  CTL contact lenses; OD right eye; OS left eye; OU both eyes  XT exotropia; ET esotropia; PEK punctate epithelial keratitis; PEE punctate epithelial erosions; DES dry eye syndrome; MGD meibomian gland dysfunction; ATs artificial tears; PFAT's preservative free artificial  tears; [place] nuclear sclerotic cataract; PSC posterior subcapsular cataract; ERM epi-retinal membrane; PVD posterior vitreous detachment; RD retinal detachment; DM diabetes mellitus; DR diabetic retinopathy; NPDR non-proliferative diabetic  retinopathy; PDR proliferative diabetic retinopathy; CSME clinically significant macular edema; DME diabetic macular edema; dbh dot blot hemorrhages; CWS cotton wool spot; POAG primary open angle glaucoma; C/D cup-to-disc ratio; HVF humphrey visual field; GVF goldmann visual field; OCT optical coherence tomography; IOP intraocular pressure; BRVO Branch retinal vein occlusion; CRVO central retinal vein occlusion; CRAO central retinal artery occlusion; BRAO branch retinal artery occlusion; RT retinal tear; SB scleral buckle; PPV pars plana vitrectomy; VH Vitreous hemorrhage; PRP panretinal laser photocoagulation; IVK intravitreal kenalog; VMT vitreomacular traction; MH Macular hole;  NVD neovascularization of the disc; NVE neovascularization elsewhere; AREDS age related eye disease study; ARMD age related macular degeneration; POAG primary open angle glaucoma; EBMD epithelial/anterior basement membrane dystrophy; ACIOL anterior chamber intraocular lens; IOL intraocular lens; PCIOL posterior chamber intraocular lens; Phaco/IOL phacoemulsification with intraocular lens placement; [place] photorefractive keratectomy; LASIK laser assisted in situ keratomileusis; HTN hypertension; DM diabetes mellitus; COPD chronic obstructive pulmonary disease

## 2019-10-03 NOTE — Patient Instructions (Signed)
Will notify the office of new onset visual acuity decline, distortion or blurring of vision

## 2019-10-12 DIAGNOSIS — I25119 Atherosclerotic heart disease of native coronary artery with unspecified angina pectoris: Secondary | ICD-10-CM | POA: Diagnosis not present

## 2019-10-12 DIAGNOSIS — R7301 Impaired fasting glucose: Secondary | ICD-10-CM | POA: Diagnosis not present

## 2019-10-12 DIAGNOSIS — E559 Vitamin D deficiency, unspecified: Secondary | ICD-10-CM | POA: Diagnosis not present

## 2019-10-12 DIAGNOSIS — H3530 Unspecified macular degeneration: Secondary | ICD-10-CM | POA: Diagnosis not present

## 2019-10-12 DIAGNOSIS — Z23 Encounter for immunization: Secondary | ICD-10-CM | POA: Diagnosis not present

## 2019-10-12 DIAGNOSIS — E782 Mixed hyperlipidemia: Secondary | ICD-10-CM | POA: Diagnosis not present

## 2019-10-12 DIAGNOSIS — H9319 Tinnitus, unspecified ear: Secondary | ICD-10-CM | POA: Diagnosis not present

## 2019-10-12 DIAGNOSIS — I10 Essential (primary) hypertension: Secondary | ICD-10-CM | POA: Diagnosis not present

## 2019-10-12 DIAGNOSIS — M720 Palmar fascial fibromatosis [Dupuytren]: Secondary | ICD-10-CM | POA: Diagnosis not present

## 2019-10-12 DIAGNOSIS — I2510 Atherosclerotic heart disease of native coronary artery without angina pectoris: Secondary | ICD-10-CM | POA: Diagnosis not present

## 2019-10-12 DIAGNOSIS — I259 Chronic ischemic heart disease, unspecified: Secondary | ICD-10-CM | POA: Diagnosis not present

## 2019-10-12 DIAGNOSIS — G459 Transient cerebral ischemic attack, unspecified: Secondary | ICD-10-CM | POA: Diagnosis not present

## 2019-10-17 DIAGNOSIS — I10 Essential (primary) hypertension: Secondary | ICD-10-CM | POA: Diagnosis not present

## 2019-10-17 DIAGNOSIS — I2510 Atherosclerotic heart disease of native coronary artery without angina pectoris: Secondary | ICD-10-CM | POA: Diagnosis not present

## 2019-10-17 DIAGNOSIS — E782 Mixed hyperlipidemia: Secondary | ICD-10-CM | POA: Diagnosis not present

## 2019-10-17 DIAGNOSIS — Z0001 Encounter for general adult medical examination with abnormal findings: Secondary | ICD-10-CM | POA: Diagnosis not present

## 2019-10-17 DIAGNOSIS — R7301 Impaired fasting glucose: Secondary | ICD-10-CM | POA: Diagnosis not present

## 2019-10-17 DIAGNOSIS — H3530 Unspecified macular degeneration: Secondary | ICD-10-CM | POA: Diagnosis not present

## 2019-11-09 DIAGNOSIS — Z23 Encounter for immunization: Secondary | ICD-10-CM | POA: Diagnosis not present

## 2019-11-28 ENCOUNTER — Other Ambulatory Visit: Payer: Self-pay

## 2019-11-28 ENCOUNTER — Ambulatory Visit (INDEPENDENT_AMBULATORY_CARE_PROVIDER_SITE_OTHER): Payer: PPO | Admitting: Ophthalmology

## 2019-11-28 ENCOUNTER — Encounter (INDEPENDENT_AMBULATORY_CARE_PROVIDER_SITE_OTHER): Payer: PPO | Admitting: Ophthalmology

## 2019-11-28 ENCOUNTER — Encounter (INDEPENDENT_AMBULATORY_CARE_PROVIDER_SITE_OTHER): Payer: Self-pay | Admitting: Ophthalmology

## 2019-11-28 DIAGNOSIS — H353211 Exudative age-related macular degeneration, right eye, with active choroidal neovascularization: Secondary | ICD-10-CM

## 2019-11-28 DIAGNOSIS — H35372 Puckering of macula, left eye: Secondary | ICD-10-CM | POA: Diagnosis not present

## 2019-11-28 MED ORDER — BEVACIZUMAB CHEMO INJECTION 1.25MG/0.05ML SYRINGE FOR KALEIDOSCOPE
1.2500 mg | INTRAVITREAL | Status: AC | PRN
  Administered 2019-11-28: 11:00:00 1.25 mg via INTRAVITREAL

## 2019-11-28 NOTE — Progress Notes (Signed)
11/28/2019     CHIEF COMPLAINT Patient presents for Retina Follow Up   HISTORY OF PRESENT ILLNESS: [name] [name] is a 79 y.o. male who presents to the clinic today for:   HPI    Retina Follow Up    Patient presents with  Wet AMD.  In right eye.  This started 8 weeks ago.  Severity is mild.  Duration of 8 weeks.  Since onset it is stable.          Comments    8 Week AMD F/U OD, poss Avastin OD  Pt reports stable [facility] OU. Pt reports continued intermittent floaters OD.        Last edited by Rockie Neighbours, [place] on 11/28/2019 10:00 AM. (History)      Referring physician: Celene Squibb, MD [place],  [place] [id number]  HISTORICAL INFORMATION:   Selected notes from the MEDICAL RECORD NUMBER    Lab Results  Component Value Date   HGBA1C 6.0 (H) 07/14/2014     CURRENT MEDICATIONS: Current Outpatient Medications (Ophthalmic Drugs)  Medication Sig  . Polyethyl Glycol-Propyl Glycol (SYSTANE OP) Place 1 drop into both eyes 3 (three) times daily as needed (Dry Eyes).   Marland Kitchen PROLENSA 0.07 % SOLN Place 1 drop into the left eye daily.   No current facility-administered medications for this visit. (Ophthalmic Drugs)   Current Outpatient Medications (Other)  Medication Sig  . aspirin 81 MG tablet Take 1 tablet (81 mg total) by mouth daily.  . Cholecalciferol (VITAMIN D3) 2000 units TABS Take 2,000 Units by mouth daily.  . clopidogrel (PLAVIX) 75 MG tablet Take 75 mg by mouth daily.  . nitroGLYCERIN (NITROSTAT) 0.4 MG SL tablet Place 1 tablet (0.4 mg total) under the tongue every 5 (five) minutes x 3 doses as needed (if no relief after 3rd dose, proceed to the ED for an evaluation or call 911).  . polycarbophil (FIBERCON) 625 MG tablet Take 1,250 mg by mouth daily.  . simvastatin (ZOCOR) 40 MG tablet Take 40 mg by mouth every evening.   Marland Kitchen telmisartan (MICARDIS) 80 MG tablet Take 80 mg by mouth daily.   Marland Kitchen UNABLE TO FIND 1 Syringe by Intravitreal route See admin  instructions. Patient receives eye injections every 6 to 8 weeks.   No current facility-administered medications for this visit. (Other)      REVIEW OF SYSTEMS:    ALLERGIES Allergies  Allergen Reactions  . Penicillins Nausea Only    Has patient had a PCN reaction causing immediate rash, facial/tongue/throat swelling, SOB or lightheadedness with hypotension: No Has patient had a PCN reaction causing severe rash involving mucus membranes or skin necrosis: No Has patient had a PCN reaction that required hospitalization No Has patient had a PCN reaction occurring within the last 10 years: No If all of the above answers are "NO", then may proceed with Cephalosporin use.     PAST MEDICAL HISTORY Past Medical History:  Diagnosis Date  . Age-related macular degeneration, dry, right eye   . Arthritis   . Coronary atherosclerosis of native coronary artery    PTCA/stent to LAD and diagonal 2001; DES to LAD, DES to RCA, DES x 2 to circumflex/OM1 07/2016  . CVA (cerebral vascular accident) ([place]) 01/2011   Resolved dysarthria and right hemiparesis  . CVA (cerebral vascular accident) ([place]) 06/2014  . Drug intolerance    ACE inhibitor; related to cough   . Essential hypertension   . Hyperlipidemia   .  MI (myocardial infarction) ([place]) 2000  . Partial small bowel obstruction ([place]) 12//2012   Resolved spontaneously.  . Refusal of blood transfusions as patient is [name]    Past Surgical History:  Procedure Laterality Date  . APPENDECTOMY    . CATARACT EXTRACTION W/PHACO Right 05/14/2015   Procedure: CATARACT EXTRACTION PHACO AND INTRAOCULAR LENS PLACEMENT RIGHT EYE CDE=6.96;  Surgeon: Williams Che, MD;  Location: [facility];  Service: Ophthalmology;  Laterality: Right;  . CATARACT EXTRACTION W/PHACO Left 05/2019  . COLONOSCOPY W/ BIOPSIES AND POLYPECTOMY  2005  . CORONARY ANGIOPLASTY WITH STENT PLACEMENT  08/21/2016  . CORONARY STENT INTERVENTION N/A 08/21/2016   Procedure:  Coronary Stent Intervention;  Surgeon: Burnell Blanks, MD;  Location: [facility];  Service: Cardiovascular;  Laterality: N/A;  . LEFT HEART CATH AND CORONARY ANGIOGRAPHY N/A 08/21/2016   Procedure: Left Heart Cath and Coronary Angiography;  Surgeon: Burnell Blanks, MD;  Location: [facility];  Service: Cardiovascular;  Laterality: N/A;  . TONSILLECTOMY      FAMILY HISTORY Family History  Problem Relation Age of Onset  . Cancer Father   . Heart failure Father   . Hyperlipidemia Father   . Diabetes Sister   . Hypertension Sister   . Cancer Mother   . Cancer Brother   . Cancer Sister     SOCIAL HISTORY Social History   Tobacco Use  . Smoking status: Former Smoker    Packs/day: 1.00    Years: 4.00    Pack years: 4.00    Types: Cigarettes    Quit date: 10/22/1958    Years since quitting: 61.1  . Smokeless tobacco: Never Used  Vaping Use  . Vaping Use: Never used  Substance Use Topics  . Alcohol use: No    Alcohol/week: 0.0 standard drinks  . Drug use: No         OPHTHALMIC EXAM:  Base Eye Exam    Visual Acuity (ETDRS)      Right Left   Dist [place] 20/50 +2 20/40 +1   Dist ph [place] 20/40 +1 20/30       Tonometry (Tonopen, 10:01 AM)      Right Left   Pressure 15 14       Pupils      Pupils Dark Light Shape React APD   Right PERRL 2 2 Round Minimal None   Left PERRL 2 2 Round Minimal None       Visual Fields (Counting fingers)      Left Right    Full Full       Extraocular Movement      Right Left    Full Full       Neuro/Psych    Oriented x3: Yes   Mood/Affect: Normal       Dilation    Right eye: 1.0% Mydriacyl, 2.5% Phenylephrine @ 10:05 AM        Slit Lamp and Fundus Exam    External Exam      Right Left   External Normal Normal       Slit Lamp Exam      Right Left   Lids/Lashes Normal Normal   Conjunctiva/Sclera White and quiet White and quiet   Cornea Clear Clear   Anterior Chamber Deep and quiet Deep  and quiet   Iris Round and reactive Round and reactive   Lens Posterior chamber intraocular lens Posterior chamber intraocular lens   Anterior Vitreous Normal Normal  Fundus Exam      Right Left   Posterior Vitreous Normal    Disc Normal    C/D Ratio 0.05    Macula Macular thickening, nasal to FAZ, subretinal neovascular membrane    Vessels Normal    Periphery Normal           IMAGING AND PROCEDURES  Imaging and Procedures for 11/28/19  OCT, Retina - OU - Both Eyes       Right Eye Quality was good. Scan locations included subfoveal. Central Foveal Thickness: 359. Progression has improved. Findings include abnormal foveal contour, subretinal scarring, disciform scar.   Left Eye Quality was good. Scan locations included subfoveal. Central Foveal Thickness: 462. Progression has been stable. Findings include epiretinal membrane, abnormal foveal contour.   Notes Is less active CNVM nasal to the fovea, currently at 8-week interval.  Repeat injection today Examination in 8 weeks       Intravitreal Injection, Pharmacologic Agent - OD - Right Eye       Time Out 11/28/2019. 11:04 AM. Confirmed correct patient, procedure, site, and patient consented.   Anesthesia Topical anesthesia was used. Anesthetic medications included [place] 3.5%.   Procedure Preparation included 5% betadine to ocular surface, 10% betadine to eyelids, Tobramycin 0.3%. A 30 gauge needle was used.   Injection:  1.25 mg Bevacizumab (AVASTIN) SOLN   NDC: 70360-001-02, Lot: [id number]   Route: Intravitreal, Site: Right Eye, Waste: 0 mg  Post-op Post injection exam found visual acuity of at least counting fingers. The patient tolerated the procedure well. There were no complications. The patient received written and verbal post procedure care education. Post injection medications were not given.                 ASSESSMENT/PLAN:  Left epiretinal membrane Visual acuity left eye remained stable  with rather massive epiretinal membrane and inner retinal schisis  Exudative age-related macular degeneration of right eye with active choroidal neovascularization ([place]) Proved and stable now at 8-week interval repeat injection today      ICD-10-CM   1. Exudative age-related macular degeneration of right eye with active choroidal neovascularization (HCC)  H35.3211 OCT, Retina - OU - Both Eyes    Intravitreal Injection, Pharmacologic Agent - OD - Right Eye    Bevacizumab (AVASTIN) SOLN 1.25 mg  2. Left epiretinal membrane  H35.372     1.  Repeat injection intravitreal Avastin today at 8-week interval to maintain  2.  Dilate OU next visit to monitor epiretinal membrane left eye  3.  Ophthalmic Meds Ordered this visit:  Meds ordered this encounter  Medications  . Bevacizumab (AVASTIN) SOLN 1.25 mg       Return in about 8 weeks (around 01/23/2020) for DILATE OU, AVASTIN OCT, OD.  Patient Instructions  Patient asked to report any new onset visual acuity declines distortions or declines    Explained the diagnoses, plan, and follow up with the patient and they expressed understanding.  Patient expressed understanding of the importance of proper follow up care.   Clent Demark Jasmine Maceachern M.D. Diseases & Surgery of the Retina and Vitreous Retina & Diabetic [place] 11/28/19     Abbreviations: M myopia (nearsighted); A astigmatism; H hyperopia (farsighted); P presbyopia; Mrx spectacle prescription;  CTL contact lenses; OD right eye; OS left eye; OU both eyes  XT exotropia; ET esotropia; PEK punctate epithelial keratitis; PEE punctate epithelial erosions; DES dry eye syndrome; MGD meibomian gland dysfunction; ATs artificial tears; PFAT's preservative free artificial tears; [place]  nuclear sclerotic cataract; PSC posterior subcapsular cataract; ERM epi-retinal membrane; PVD posterior vitreous detachment; RD retinal detachment; DM diabetes mellitus; DR diabetic retinopathy; NPDR non-proliferative  diabetic retinopathy; PDR proliferative diabetic retinopathy; CSME clinically significant macular edema; DME diabetic macular edema; dbh dot blot hemorrhages; CWS cotton wool spot; POAG primary open angle glaucoma; C/D cup-to-disc ratio; HVF humphrey visual field; GVF goldmann visual field; OCT optical coherence tomography; IOP intraocular pressure; BRVO Branch retinal vein occlusion; CRVO central retinal vein occlusion; CRAO central retinal artery occlusion; BRAO branch retinal artery occlusion; RT retinal tear; SB scleral buckle; PPV pars plana vitrectomy; VH Vitreous hemorrhage; PRP panretinal laser photocoagulation; IVK intravitreal kenalog; VMT vitreomacular traction; MH Macular hole;  NVD neovascularization of the disc; NVE neovascularization elsewhere; AREDS age related eye disease study; ARMD age related macular degeneration; POAG primary open angle glaucoma; EBMD epithelial/anterior basement membrane dystrophy; ACIOL anterior chamber intraocular lens; IOL intraocular lens; PCIOL posterior chamber intraocular lens; Phaco/IOL phacoemulsification with intraocular lens placement; [place] photorefractive keratectomy; LASIK laser assisted in situ keratomileusis; HTN hypertension; DM diabetes mellitus; COPD chronic obstructive pulmonary disease

## 2019-11-28 NOTE — Assessment & Plan Note (Signed)
Proved and stable now at 8-week interval repeat injection today

## 2019-11-28 NOTE — Assessment & Plan Note (Signed)
Visual acuity left eye remained stable with rather massive epiretinal membrane and inner retinal schisis

## 2019-11-28 NOTE — Patient Instructions (Signed)
Patient asked to report any new onset visual acuity declines distortions or declines

## 2020-01-03 DIAGNOSIS — H35373 Puckering of macula, bilateral: Secondary | ICD-10-CM | POA: Diagnosis not present

## 2020-01-03 DIAGNOSIS — H353122 Nonexudative age-related macular degeneration, left eye, intermediate dry stage: Secondary | ICD-10-CM | POA: Diagnosis not present

## 2020-01-03 DIAGNOSIS — H353211 Exudative age-related macular degeneration, right eye, with active choroidal neovascularization: Secondary | ICD-10-CM | POA: Diagnosis not present

## 2020-01-03 DIAGNOSIS — Z961 Presence of intraocular lens: Secondary | ICD-10-CM | POA: Diagnosis not present

## 2020-01-03 DIAGNOSIS — H16223 Keratoconjunctivitis sicca, not specified as Sjogren's, bilateral: Secondary | ICD-10-CM | POA: Diagnosis not present

## 2020-01-03 DIAGNOSIS — H3581 Retinal edema: Secondary | ICD-10-CM | POA: Diagnosis not present

## 2020-01-23 ENCOUNTER — Other Ambulatory Visit: Payer: Self-pay

## 2020-01-23 ENCOUNTER — Ambulatory Visit (INDEPENDENT_AMBULATORY_CARE_PROVIDER_SITE_OTHER): Payer: PPO | Admitting: Ophthalmology

## 2020-01-23 ENCOUNTER — Encounter (INDEPENDENT_AMBULATORY_CARE_PROVIDER_SITE_OTHER): Payer: Self-pay | Admitting: Ophthalmology

## 2020-01-23 DIAGNOSIS — H35372 Puckering of macula, left eye: Secondary | ICD-10-CM

## 2020-01-23 DIAGNOSIS — H353211 Exudative age-related macular degeneration, right eye, with active choroidal neovascularization: Secondary | ICD-10-CM

## 2020-01-23 DIAGNOSIS — H353122 Nonexudative age-related macular degeneration, left eye, intermediate dry stage: Secondary | ICD-10-CM

## 2020-01-23 NOTE — Assessment & Plan Note (Signed)
OD, stable at 8-week interval, fibrous pigment epithelial detachment treat today at 8-week interval and examination again in 8 weeks

## 2020-01-23 NOTE — Assessment & Plan Note (Signed)
Increased macular thickening from epiretinal membrane

## 2020-01-23 NOTE — Progress Notes (Signed)
01/23/2020     CHIEF COMPLAINT Patient presents for Retina Follow Up   HISTORY OF PRESENT ILLNESS: [name] [name] is a 79 y.o. male who presents to the clinic today for:   HPI    Retina Follow Up    Patient presents with  Wet AMD.  In right eye.  This started 8 weeks ago.  Severity is mild.  Duration of 8 weeks.  Since onset it is stable.          Comments    8 Week F/U OU, poss Avastin OD  Pt denies noticeable changes to [place] OU since last visit. Pt denies ocular pain, flashes of light, or floaters OU.         Last edited by Rockie Neighbours, [place] on 01/23/2020 10:03 AM. (History)      Referring physician: Celene Squibb, MD [place],  [place] [id number]  HISTORICAL INFORMATION:   Selected notes from the MEDICAL RECORD NUMBER    Lab Results  Component Value Date   HGBA1C 6.0 (H) 07/14/2014     CURRENT MEDICATIONS: Current Outpatient Medications (Ophthalmic Drugs)  Medication Sig  . Polyethyl Glycol-Propyl Glycol (SYSTANE OP) Place 1 drop into both eyes 3 (three) times daily as needed (Dry Eyes).   Marland Kitchen PROLENSA 0.07 % SOLN Place 1 drop into the left eye daily.   No current facility-administered medications for this visit. (Ophthalmic Drugs)   Current Outpatient Medications (Other)  Medication Sig  . aspirin 81 MG tablet Take 1 tablet (81 mg total) by mouth daily.  . Cholecalciferol (VITAMIN D3) 2000 units TABS Take 2,000 Units by mouth daily.  . clopidogrel (PLAVIX) 75 MG tablet Take 75 mg by mouth daily.  . nitroGLYCERIN (NITROSTAT) 0.4 MG SL tablet Place 1 tablet (0.4 mg total) under the tongue every 5 (five) minutes x 3 doses as needed (if no relief after 3rd dose, proceed to the ED for an evaluation or call 911).  . polycarbophil (FIBERCON) 625 MG tablet Take 1,250 mg by mouth daily.  . simvastatin (ZOCOR) 40 MG tablet Take 40 mg by mouth every evening.   Marland Kitchen telmisartan (MICARDIS) 80 MG tablet Take 80 mg by mouth daily.   Marland Kitchen UNABLE TO FIND 1  Syringe by Intravitreal route See admin instructions. Patient receives eye injections every 6 to 8 weeks.   No current facility-administered medications for this visit. (Other)      REVIEW OF SYSTEMS:    ALLERGIES Allergies  Allergen Reactions  . Penicillins Nausea Only    Has patient had a PCN reaction causing immediate rash, facial/tongue/throat swelling, SOB or lightheadedness with hypotension: No Has patient had a PCN reaction causing severe rash involving mucus membranes or skin necrosis: No Has patient had a PCN reaction that required hospitalization No Has patient had a PCN reaction occurring within the last 10 years: No If all of the above answers are "NO", then may proceed with Cephalosporin use.     PAST MEDICAL HISTORY Past Medical History:  Diagnosis Date  . Age-related macular degeneration, dry, right eye   . Arthritis   . Coronary atherosclerosis of native coronary artery    PTCA/stent to LAD and diagonal 2001; DES to LAD, DES to RCA, DES x 2 to circumflex/OM1 07/2016  . CVA (cerebral vascular accident) ([place]) 01/2011   Resolved dysarthria and right hemiparesis  . CVA (cerebral vascular accident) ([place]) 06/2014  . Drug intolerance    ACE inhibitor; related to cough   .  Essential hypertension   . Hyperlipidemia   . MI (myocardial infarction) ([place]) 2000  . Partial small bowel obstruction ([place]) 12//2012   Resolved spontaneously.  . Refusal of blood transfusions as patient is [name]    Past Surgical History:  Procedure Laterality Date  . APPENDECTOMY    . CATARACT EXTRACTION W/PHACO Right 05/14/2015   Procedure: CATARACT EXTRACTION PHACO AND INTRAOCULAR LENS PLACEMENT RIGHT EYE CDE=6.96;  Surgeon: Williams Che, MD;  Location: [facility];  Service: Ophthalmology;  Laterality: Right;  . CATARACT EXTRACTION W/PHACO Left 05/2019  . COLONOSCOPY W/ BIOPSIES AND POLYPECTOMY  2005  . CORONARY ANGIOPLASTY WITH STENT PLACEMENT  08/21/2016  . CORONARY STENT  INTERVENTION N/A 08/21/2016   Procedure: Coronary Stent Intervention;  Surgeon: Burnell Blanks, MD;  Location: [facility];  Service: Cardiovascular;  Laterality: N/A;  . LEFT HEART CATH AND CORONARY ANGIOGRAPHY N/A 08/21/2016   Procedure: Left Heart Cath and Coronary Angiography;  Surgeon: Burnell Blanks, MD;  Location: [facility];  Service: Cardiovascular;  Laterality: N/A;  . TONSILLECTOMY      FAMILY HISTORY Family History  Problem Relation Age of Onset  . Cancer Father   . Heart failure Father   . Hyperlipidemia Father   . Diabetes Sister   . Hypertension Sister   . Cancer Mother   . Cancer Brother   . Cancer Sister     SOCIAL HISTORY Social History   Tobacco Use  . Smoking status: Former Smoker    Packs/day: 1.00    Years: 4.00    Pack years: 4.00    Types: Cigarettes    Quit date: 10/22/1958    Years since quitting: 61.2  . Smokeless tobacco: Never Used  Vaping Use  . Vaping Use: Never used  Substance Use Topics  . Alcohol use: No    Alcohol/week: 0.0 standard drinks  . Drug use: No         OPHTHALMIC EXAM: Base Eye Exam    Visual Acuity (ETDRS)      Right Left   Dist [place] 20/50 +2 20/30 +1   Dist ph [place] 20/40 NI       Tonometry (Tonopen, 10:04 AM)      Right Left   Pressure 17 19       Pupils      Pupils Dark Light Shape React APD   Right PERRL 2 2 Round Minimal None   Left PERRL 2 2 Round Minimal None       Visual Fields (Counting fingers)      Left Right    Full Full       Extraocular Movement      Right Left    Full Full       Neuro/Psych    Oriented x3: Yes   Mood/Affect: Normal       Dilation    Both eyes: 1.0% Mydriacyl, 2.5% Phenylephrine @ 10:06 AM        Slit Lamp and Fundus Exam    External Exam      Right Left   External Normal Normal       Slit Lamp Exam      Right Left   Lids/Lashes Normal Normal   Conjunctiva/Sclera White and quiet White and quiet   Cornea Clear Clear    Anterior Chamber Deep and quiet Deep and quiet   Iris Round and reactive Round and reactive   Lens Posterior chamber intraocular lens Posterior chamber intraocular lens  Anterior Vitreous Normal Normal       Fundus Exam      Right Left   Posterior Vitreous Normal Normal   Disc Normal Normal   C/D Ratio 0.05 0.05   Macula Macular thickening, nasal to FAZ, subretinal neovascular membrane Epiretinal membrane   Vessels Normal Normal   Periphery Normal Normal          IMAGING AND PROCEDURES  Imaging and Procedures for 01/23/20  OCT, Retina - OU - Both Eyes       Right Eye Quality was good. Scan locations included subfoveal. Central Foveal Thickness: 371. Progression has been stable. Findings include pigment epithelial detachment.   Left Eye Quality was good. Scan locations included subfoveal. Central Foveal Thickness: 476. Progression has been stable. Findings include abnormal foveal contour, epiretinal membrane.   Notes Much less active fibrovascular pigment epithelial detachment nasal to the fovea right eye, at 8-week interval repeat today and examination again in 8 weeks  Left eye with epiretinal membrane with progression of retinal thickening yet good acuity will observe                ASSESSMENT/PLAN:  Left epiretinal membrane Increased macular thickening from epiretinal membrane  Exudative age-related macular degeneration of right eye with active choroidal neovascularization (HCC) OD, stable at 8-week interval, fibrous pigment epithelial detachment treat today at 8-week interval and examination again in 8 weeks  Intermediate stage nonexudative age-related macular degeneration of left eye The nature of age--related macular degeneration was discussed with the patient as well as the distinction between dry and wet types. Checking an Amsler Grid daily with advice to return immediately should a distortion develop, was given to the patient. The patient 's smoking  status now and in the past was determined and advice based on the AREDS study was provided regarding the consumption of antioxidant supplements. AREDS 2 vitamin formulation was recommended. Consumption of dark leafy vegetables and fresh fruits of various colors was recommended. Treatment modalities for wet macular degeneration particularly the use of intravitreal injections of anti-blood vessel growth factors was discussed with the patient. Avastin, Lucentis, and Eylea are the available options. On occasion, therapy includes the use of photodynamic therapy and thermal laser. Stressed to the patient do not rub eyes.  Patient was advised to check Amsler Grid daily and return immediately if changes are noted. Instructions on using the grid were given to the patient. All patient questions were answered.      ICD-10-CM   1. Exudative age-related macular degeneration of right eye with active choroidal neovascularization (HCC)  H35.3211 OCT, Retina - OU - Both Eyes  2. Left epiretinal membrane  H35.372   3. Intermediate stage nonexudative age-related macular degeneration of left eye  H35.3122     1.  Repeat intravitreal Avastin OD today and examination in 8 weeks  2.  3.  Ophthalmic Meds Ordered this visit:  No orders of the defined types were placed in this encounter.      No follow-ups on file.  There are no Patient Instructions on file for this visit.   Explained the diagnoses, plan, and follow up with the patient and they expressed understanding.  Patient expressed understanding of the importance of proper follow up care.   Clent Demark Denzil Bristol M.D. Diseases & Surgery of the Retina and Vitreous Retina & Diabetic [place] 01/23/20     Abbreviations: M myopia (nearsighted); A astigmatism; H hyperopia (farsighted); P presbyopia; Mrx spectacle prescription;  CTL contact lenses; OD right  eye; OS left eye; OU both eyes  XT exotropia; ET esotropia; PEK punctate epithelial keratitis; PEE  punctate epithelial erosions; DES dry eye syndrome; MGD meibomian gland dysfunction; ATs artificial tears; PFAT's preservative free artificial tears; [place] nuclear sclerotic cataract; PSC posterior subcapsular cataract; ERM epi-retinal membrane; PVD posterior vitreous detachment; RD retinal detachment; DM diabetes mellitus; DR diabetic retinopathy; NPDR non-proliferative diabetic retinopathy; PDR proliferative diabetic retinopathy; CSME clinically significant macular edema; DME diabetic macular edema; dbh dot blot hemorrhages; CWS cotton wool spot; POAG primary open angle glaucoma; C/D cup-to-disc ratio; HVF humphrey visual field; GVF goldmann visual field; OCT optical coherence tomography; IOP intraocular pressure; BRVO Branch retinal vein occlusion; CRVO central retinal vein occlusion; CRAO central retinal artery occlusion; BRAO branch retinal artery occlusion; RT retinal tear; SB scleral buckle; PPV pars plana vitrectomy; VH Vitreous hemorrhage; PRP panretinal laser photocoagulation; IVK intravitreal kenalog; VMT vitreomacular traction; MH Macular hole;  NVD neovascularization of the disc; NVE neovascularization elsewhere; AREDS age related eye disease study; ARMD age related macular degeneration; POAG primary open angle glaucoma; EBMD epithelial/anterior basement membrane dystrophy; ACIOL anterior chamber intraocular lens; IOL intraocular lens; PCIOL posterior chamber intraocular lens; Phaco/IOL phacoemulsification with intraocular lens placement; [place] photorefractive keratectomy; LASIK laser assisted in situ keratomileusis; HTN hypertension; DM diabetes mellitus; COPD chronic obstructive pulmonary disease

## 2020-01-23 NOTE — Assessment & Plan Note (Signed)

## 2020-02-15 DIAGNOSIS — R051 Acute cough: Secondary | ICD-10-CM | POA: Diagnosis not present

## 2020-02-15 DIAGNOSIS — J0100 Acute maxillary sinusitis, unspecified: Secondary | ICD-10-CM | POA: Diagnosis not present

## 2020-03-19 ENCOUNTER — Other Ambulatory Visit: Payer: Self-pay

## 2020-03-19 ENCOUNTER — Ambulatory Visit (INDEPENDENT_AMBULATORY_CARE_PROVIDER_SITE_OTHER): Payer: PPO | Admitting: Ophthalmology

## 2020-03-19 ENCOUNTER — Encounter (INDEPENDENT_AMBULATORY_CARE_PROVIDER_SITE_OTHER): Payer: Self-pay | Admitting: Ophthalmology

## 2020-03-19 DIAGNOSIS — H353211 Exudative age-related macular degeneration, right eye, with active choroidal neovascularization: Secondary | ICD-10-CM

## 2020-03-19 DIAGNOSIS — H35372 Puckering of macula, left eye: Secondary | ICD-10-CM | POA: Diagnosis not present

## 2020-03-19 MED ORDER — BEVACIZUMAB 2.5 MG/0.1ML IZ SOSY
2.5000 mg | PREFILLED_SYRINGE | INTRAVITREAL | Status: AC | PRN
  Administered 2020-03-19: 2.5 mg via INTRAVITREAL

## 2020-03-19 NOTE — Assessment & Plan Note (Signed)
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  We will repeat injection today and examination again in 8 weeks OD possible injection

## 2020-03-19 NOTE — Progress Notes (Signed)
03/19/2020     CHIEF COMPLAINT Patient presents for Retina Follow Up (8 Week AMD F/U OD, poss Avastin OD//Pt denies noticeable changes to [place] OU since last visit. Pt denies ocular pain, flashes of light, or floaters OU. //)   HISTORY OF PRESENT ILLNESS: [name] [name] is a 80 y.o. male who presents to the clinic today for:   HPI    Retina Follow Up    Patient presents with  Wet AMD.  In right eye.  This started 8 weeks ago.  Severity is mild.  Duration of 8 weeks.  Since onset it is stable. Additional comments: 8 Week AMD F/U OD, poss Avastin OD  Pt denies noticeable changes to [place] OU since last visit. Pt denies ocular pain, flashes of light, or floaters OU.          Last edited by Rockie Neighbours, [place] on 03/19/2020 10:21 AM. (History)      Referring physician: Celene Squibb, MD [place],   [id number]  HISTORICAL INFORMATION:   Selected notes from the MEDICAL RECORD NUMBER    Lab Results  Component Value Date   HGBA1C 6.0 (H) 07/14/2014     CURRENT MEDICATIONS: Current Outpatient Medications (Ophthalmic Drugs)  Medication Sig  . Polyethyl Glycol-Propyl Glycol (SYSTANE OP) Place 1 drop into both eyes 3 (three) times daily as needed (Dry Eyes).   Marland Kitchen PROLENSA 0.07 % SOLN Place 1 drop into the left eye daily.   No current facility-administered medications for this visit. (Ophthalmic Drugs)   Current Outpatient Medications (Other)  Medication Sig  . aspirin 81 MG tablet Take 1 tablet (81 mg total) by mouth daily.  . Cholecalciferol (VITAMIN D3) 2000 units TABS Take 2,000 Units by mouth daily.  . clopidogrel (PLAVIX) 75 MG tablet Take 75 mg by mouth daily.  . nitroGLYCERIN (NITROSTAT) 0.4 MG SL tablet Place 1 tablet (0.4 mg total) under the tongue every 5 (five) minutes x 3 doses as needed (if no relief after 3rd dose, proceed to the ED for an evaluation or call 911).  . polycarbophil (FIBERCON) 625 MG tablet Take 1,250 mg by mouth daily.  . simvastatin  (ZOCOR) 40 MG tablet Take 40 mg by mouth every evening.   Marland Kitchen telmisartan (MICARDIS) 80 MG tablet Take 80 mg by mouth daily.   Marland Kitchen UNABLE TO FIND 1 Syringe by Intravitreal route See admin instructions. Patient receives eye injections every 6 to 8 weeks.   No current facility-administered medications for this visit. (Other)      REVIEW OF SYSTEMS:    ALLERGIES Allergies  Allergen Reactions  . Penicillins Nausea Only    Has patient had a PCN reaction causing immediate rash, facial/tongue/throat swelling, SOB or lightheadedness with hypotension: No Has patient had a PCN reaction causing severe rash involving mucus membranes or skin necrosis: No Has patient had a PCN reaction that required hospitalization No Has patient had a PCN reaction occurring within the last 10 years: No If all of the above answers are "NO", then may proceed with Cephalosporin use.     PAST MEDICAL HISTORY Past Medical History:  Diagnosis Date  . Age-related macular degeneration, dry, right eye   . Arthritis   . Coronary atherosclerosis of native coronary artery    PTCA/stent to LAD and diagonal 2001; DES to LAD, DES to RCA, DES x 2 to circumflex/OM1 07/2016  . CVA (cerebral vascular accident) ([place]) 01/2011   Resolved dysarthria and right hemiparesis  .  CVA (cerebral vascular accident) ([facility]) 06/2014  . Drug intolerance    ACE inhibitor; related to cough   . Essential hypertension   . Hyperlipidemia   . MI (myocardial infarction) ([facility]) 2000  . Partial small bowel obstruction (HCC) 12//2012   Resolved spontaneously.  . Refusal of blood transfusions as patient is [name]    Past Surgical History:  Procedure Laterality Date  . APPENDECTOMY    . CATARACT EXTRACTION W/PHACO Right 05/14/2015   Procedure: CATARACT EXTRACTION PHACO AND INTRAOCULAR LENS PLACEMENT RIGHT EYE CDE=6.96;  Surgeon: Susa Simmonds, MD;  Location: [facility];  Service: Ophthalmology;  Laterality: Right;  . CATARACT EXTRACTION  W/PHACO Left 05/2019  . COLONOSCOPY W/ BIOPSIES AND POLYPECTOMY  2005  . CORONARY ANGIOPLASTY WITH STENT PLACEMENT  08/21/2016  . CORONARY STENT INTERVENTION N/A 08/21/2016   Procedure: Coronary Stent Intervention;  Surgeon: Kathleene Hazel, MD;  Location: [facility] INVASIVE [facility];  Service: Cardiovascular;  Laterality: N/A;  . LEFT HEART CATH AND CORONARY ANGIOGRAPHY N/A 08/21/2016   Procedure: Left Heart Cath and Coronary Angiography;  Surgeon: Kathleene Hazel, MD;  [facility]: [facility];  Service: Cardiovascular;  Laterality: N/A;  . TONSILLECTOMY      FAMILY HISTORY Family History  Problem Relation Age of Onset  . Cancer Father   . Heart failure Father   . Hyperlipidemia Father   . Diabetes Sister   . Hypertension Sister   . Cancer Mother   . Cancer Brother   . Cancer Sister     SOCIAL HISTORY Social History   Tobacco Use  . Smoking status: Former Smoker    Packs/day: 1.00    Years: 4.00    Pack years: 4.00    Types: Cigarettes    Quit date: 10/22/1958    Years since quitting: 61.4  . Smokeless tobacco: Never Used  Vaping Use  . Vaping Use: Never used  Substance Use Topics  . Alcohol use: No    Alcohol/week: 0.0 standard drinks  . Drug use: No         OPHTHALMIC EXAM: Base Eye Exam    Visual Acuity (ETDRS)      Right Left   Dist [place] 20/50 20/30 -2   Dist ph [place] 20/30 -1 NI       Tonometry (Tonopen, 10:22 AM)      Right Left   Pressure 12 12       Pupils      Pupils Dark Light Shape React APD   Right PERRL 2 2 Round Minimal None   Left PERRL 2 2 Round Minimal None       Visual Fields (Counting fingers)      Left Right    Full Full       Extraocular Movement      Right Left    Full Full       Neuro/Psych    Oriented x3: Yes   Mood/Affect: Normal       Dilation    Right eye: 1.0% Mydriacyl, 2.5% Phenylephrine @ 10:26 AM        Slit Lamp and Fundus Exam    External Exam      Right Left   External Normal Normal        Slit Lamp Exam      Right Left   Lids/Lashes Normal Normal   Conjunctiva/Sclera White and quiet White and quiet   Cornea Clear Clear   Anterior Chamber Deep and quiet Deep and  quiet   Iris Round and reactive Round and reactive   Lens Posterior chamber intraocular lens Posterior chamber intraocular lens   Anterior Vitreous Normal Normal       Fundus Exam      Right Left   Posterior Vitreous Normal    Disc Normal    C/D Ratio 0.05    Macula Macular thickening, nasal to FAZ, subretinal neovascular membrane, Hard drusen    Vessels Normal    Periphery Normal           IMAGING AND PROCEDURES  Imaging and Procedures for 03/19/20  OCT, Retina - OU - Both Eyes       Right Eye Quality was good. Scan locations included subfoveal. Central Foveal Thickness: 364. Progression has been stable. Findings include abnormal foveal contour, subretinal fluid, pigment epithelial detachment.   Left Eye Quality was good. Scan locations included subfoveal. Central Foveal Thickness: 480. Progression has worsened. Findings include abnormal foveal contour, epiretinal membrane.   Notes Subretinal fluid overlying pigment epithelial detachment vascularized, nasal to the fovea OD, stable on Avastin currently at 8-week follow-up.       Intravitreal Injection, Pharmacologic Agent - OD - Right Eye       Time Out 03/19/2020. 10:57 AM. Confirmed correct patient, procedure, site, and patient consented.   Anesthesia Topical anesthesia was used. Anesthetic medications included Akten 3.5%.   Procedure Preparation included 5% betadine to ocular surface, 10% betadine to eyelids, Tobramycin 0.3%. A 30 gauge needle was used.   Injection:  2.5 mg Bevacizumab (AVASTIN) 2.5mg /0.61mL SOSY   NDC: O3169984, LotSE:[id number]   Route: Intravitreal, Site: Right Eye  Post-op Post injection exam found visual acuity of at least counting fingers. The patient tolerated the procedure well. There were no  complications. The patient received written and verbal post procedure care education. Post injection medications were not given.                 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  We will repeat injection today and examination again in 8 weeks OD possible injection      ICD-10-CM   1. Exudative age-related macular degeneration of right eye with active choroidal neovascularization (HCC)  H35.3211 OCT, Retina - OU - Both Eyes    Intravitreal Injection, Pharmacologic Agent - OD - Right Eye    bevacizumab (AVASTIN) SOSY 2.5 mg  2. Left epiretinal membrane  H35.372     1.  Repeat injection intravitreal Avastin OD today  2.  Dilate OU next monitor macular pucker left eye  3.  Possible injection right eye next  Ophthalmic Meds Ordered this visit:  Meds ordered this encounter  Medications  . bevacizumab (AVASTIN) SOSY 2.5 mg       Return in about 8 weeks (around 05/14/2020) for OD, AVASTIN OCT, DILATE OU.  There are no Patient Instructions on file for this visit.   Explained the diagnoses, plan, and follow up with the patient and they expressed understanding.  Patient expressed understanding of the importance of proper follow up care.   Clent Demark Andriea Hasegawa M.D. Diseases & Surgery of the Retina and Vitreous Retina & Diabetic [place] 03/19/20     Abbreviations: M myopia (nearsighted); A astigmatism; H hyperopia (farsighted); P presbyopia; Mrx spectacle prescription;  CTL contact lenses; OD right eye; OS left eye; OU both eyes  XT exotropia; ET esotropia; PEK punctate epithelial keratitis; PEE punctate epithelial erosions; DES dry eye syndrome; MGD meibomian gland  dysfunction; ATs artificial tears; PFAT's preservative free artificial tears; [place] nuclear sclerotic cataract; PSC posterior subcapsular cataract; ERM epi-retinal membrane; PVD posterior vitreous detachment; RD retinal detachment; DM diabetes mellitus; DR diabetic retinopathy; NPDR non-proliferative diabetic retinopathy; PDR proliferative diabetic retinopathy; CSME clinically significant macular edema; DME diabetic macular edema; dbh dot blot hemorrhages; CWS cotton wool spot; POAG primary open angle glaucoma; C/D cup-to-disc ratio; HVF humphrey visual field; GVF goldmann visual field; OCT optical coherence tomography; IOP intraocular pressure; BRVO Branch retinal vein occlusion; CRVO central retinal vein occlusion; CRAO central retinal artery occlusion; BRAO branch retinal artery occlusion; RT retinal tear; SB scleral buckle; PPV pars plana vitrectomy; VH Vitreous hemorrhage; PRP panretinal laser photocoagulation; IVK intravitreal kenalog; VMT vitreomacular traction; MH Macular hole;  NVD neovascularization of the disc; NVE neovascularization elsewhere; AREDS age related eye disease study; ARMD age related macular degeneration; POAG primary open angle glaucoma; EBMD epithelial/anterior basement membrane dystrophy; ACIOL anterior chamber intraocular lens; IOL intraocular lens; PCIOL posterior chamber intraocular lens; Phaco/IOL phacoemulsification with intraocular lens placement; [place] photorefractive keratectomy; LASIK laser assisted in situ keratomileusis; HTN hypertension; DM diabetes mellitus; COPD chronic obstructive pulmonary disease

## 2020-03-19 NOTE — Assessment & Plan Note (Signed)
Macular thickening left eye continues to worsen secondary to epiretinal membrane

## 2020-04-13 DIAGNOSIS — G459 Transient cerebral ischemic attack, unspecified: Secondary | ICD-10-CM | POA: Diagnosis not present

## 2020-04-13 DIAGNOSIS — I25119 Atherosclerotic heart disease of native coronary artery with unspecified angina pectoris: Secondary | ICD-10-CM | POA: Diagnosis not present

## 2020-04-13 DIAGNOSIS — E782 Mixed hyperlipidemia: Secondary | ICD-10-CM | POA: Diagnosis not present

## 2020-04-13 DIAGNOSIS — M720 Palmar fascial fibromatosis [Dupuytren]: Secondary | ICD-10-CM | POA: Diagnosis not present

## 2020-04-13 DIAGNOSIS — Z23 Encounter for immunization: Secondary | ICD-10-CM | POA: Diagnosis not present

## 2020-04-13 DIAGNOSIS — I10 Essential (primary) hypertension: Secondary | ICD-10-CM | POA: Diagnosis not present

## 2020-04-13 DIAGNOSIS — I259 Chronic ischemic heart disease, unspecified: Secondary | ICD-10-CM | POA: Diagnosis not present

## 2020-04-13 DIAGNOSIS — H9319 Tinnitus, unspecified ear: Secondary | ICD-10-CM | POA: Diagnosis not present

## 2020-04-13 DIAGNOSIS — I2510 Atherosclerotic heart disease of native coronary artery without angina pectoris: Secondary | ICD-10-CM | POA: Diagnosis not present

## 2020-04-13 DIAGNOSIS — R7301 Impaired fasting glucose: Secondary | ICD-10-CM | POA: Diagnosis not present

## 2020-04-13 DIAGNOSIS — H3530 Unspecified macular degeneration: Secondary | ICD-10-CM | POA: Diagnosis not present

## 2020-04-13 DIAGNOSIS — E559 Vitamin D deficiency, unspecified: Secondary | ICD-10-CM | POA: Diagnosis not present

## 2020-04-18 DIAGNOSIS — R7301 Impaired fasting glucose: Secondary | ICD-10-CM | POA: Diagnosis not present

## 2020-04-18 DIAGNOSIS — H353211 Exudative age-related macular degeneration, right eye, with active choroidal neovascularization: Secondary | ICD-10-CM | POA: Diagnosis not present

## 2020-04-18 DIAGNOSIS — I10 Essential (primary) hypertension: Secondary | ICD-10-CM | POA: Diagnosis not present

## 2020-04-18 DIAGNOSIS — E782 Mixed hyperlipidemia: Secondary | ICD-10-CM | POA: Diagnosis not present

## 2020-04-18 DIAGNOSIS — H3530 Unspecified macular degeneration: Secondary | ICD-10-CM | POA: Diagnosis not present

## 2020-04-18 DIAGNOSIS — I2510 Atherosclerotic heart disease of native coronary artery without angina pectoris: Secondary | ICD-10-CM | POA: Diagnosis not present

## 2020-05-14 ENCOUNTER — Other Ambulatory Visit: Payer: Self-pay

## 2020-05-14 ENCOUNTER — Encounter (INDEPENDENT_AMBULATORY_CARE_PROVIDER_SITE_OTHER): Payer: Self-pay | Admitting: Ophthalmology

## 2020-05-14 ENCOUNTER — Ambulatory Visit (INDEPENDENT_AMBULATORY_CARE_PROVIDER_SITE_OTHER): Payer: PPO | Admitting: Ophthalmology

## 2020-05-14 DIAGNOSIS — H353211 Exudative age-related macular degeneration, right eye, with active choroidal neovascularization: Secondary | ICD-10-CM | POA: Diagnosis not present

## 2020-05-14 DIAGNOSIS — H35372 Puckering of macula, left eye: Secondary | ICD-10-CM | POA: Diagnosis not present

## 2020-05-14 MED ORDER — BEVACIZUMAB 2.5 MG/0.1ML IZ SOSY
2.5000 mg | PREFILLED_SYRINGE | INTRAVITREAL | Status: AC | PRN
  Administered 2020-05-14: 2.5 mg via INTRAVITREAL

## 2020-05-14 NOTE — Assessment & Plan Note (Signed)
Moderate OS with impact on acuity yet stable at 20/30 acuity may observe or could intervene at any time with surgical removal of ILM

## 2020-05-14 NOTE — Assessment & Plan Note (Signed)
Vascularized pigment epithelial detachment nasal to the fovea stable OD   over time

## 2020-05-14 NOTE — Patient Instructions (Signed)
Patient instructed to contact the office promptly if new onset visual acuity declines or distortions

## 2020-05-14 NOTE — Progress Notes (Signed)
05/14/2020     CHIEF COMPLAINT Patient presents for Retina Follow Up (8 Week Wet AMD f\u. Possible Avastin OD. OCT/Pt states vision is stable since last visit. Denies complaints.)   HISTORY OF PRESENT ILLNESS: [name] [name] is a 80 y.o. male who presents to the clinic today for:   HPI    Retina Follow Up    Patient presents with  Wet AMD.  In right eye.  Severity is moderate.  Duration of 8 weeks.  Since onset it is stable.  I, the attending physician,  performed the HPI with the patient and updated documentation appropriately. Additional comments: 8 Week Wet AMD f\u. Possible Avastin OD. OCT Pt states vision is stable since last visit. Denies complaints.       Last edited by Tilda Franco on 05/14/2020 10:32 AM. (History)      Referring physician: Celene Squibb, MD [place],  [place] [id number]  HISTORICAL INFORMATION:   Selected notes from the MEDICAL RECORD NUMBER    Lab Results  Component Value Date   HGBA1C 6.0 (H) 07/14/2014     CURRENT MEDICATIONS: Current Outpatient Medications (Ophthalmic Drugs)  Medication Sig  . Polyethyl Glycol-Propyl Glycol (SYSTANE OP) Place 1 drop into both eyes 3 (three) times daily as needed (Dry Eyes).   Marland Kitchen PROLENSA 0.07 % SOLN Place 1 drop into the left eye daily.   No current facility-administered medications for this visit. (Ophthalmic Drugs)   Current Outpatient Medications (Other)  Medication Sig  . aspirin 81 MG tablet Take 1 tablet (81 mg total) by mouth daily.  . Cholecalciferol (VITAMIN D3) 2000 units TABS Take 2,000 Units by mouth daily.  . clopidogrel (PLAVIX) 75 MG tablet Take 75 mg by mouth daily.  . nitroGLYCERIN (NITROSTAT) 0.4 MG SL tablet Place 1 tablet (0.4 mg total) under the tongue every 5 (five) minutes x 3 doses as needed (if no relief after 3rd dose, proceed to the ED for an evaluation or call 911).  . polycarbophil (FIBERCON) 625 MG tablet Take 1,250 mg by mouth daily.  . simvastatin (ZOCOR)  40 MG tablet Take 40 mg by mouth every evening.   Marland Kitchen telmisartan (MICARDIS) 80 MG tablet Take 80 mg by mouth daily.   Marland Kitchen UNABLE TO FIND 1 Syringe by Intravitreal route See admin instructions. Patient receives eye injections every 6 to 8 weeks.   No current facility-administered medications for this visit. (Other)      REVIEW OF SYSTEMS:    ALLERGIES Allergies  Allergen Reactions  . Penicillins Nausea Only    Has patient had a PCN reaction causing immediate rash, facial/tongue/throat swelling, SOB or lightheadedness with hypotension: No Has patient had a PCN reaction causing severe rash involving mucus membranes or skin necrosis: No Has patient had a PCN reaction that required hospitalization No Has patient had a PCN reaction occurring within the last 10 years: No If all of the above answers are "NO", then may proceed with Cephalosporin use.     PAST MEDICAL HISTORY Past Medical History:  Diagnosis Date  . Age-related macular degeneration, dry, right eye   . Arthritis   . Coronary atherosclerosis of native coronary artery    PTCA/stent to LAD and diagonal 2001; DES to LAD, DES to RCA, DES x 2 to circumflex/OM1 07/2016  . CVA (cerebral vascular accident) ([place]) 01/2011   Resolved dysarthria and right hemiparesis  . CVA (cerebral vascular accident) ([place]) 06/2014  . Drug intolerance  ACE inhibitor; related to cough   . Essential hypertension   . Hyperlipidemia   . MI (myocardial infarction) ([place]) 2000  . Partial small bowel obstruction ([place]) 12//2012   Resolved spontaneously.  . Refusal of blood transfusions as patient is [name]    Past Surgical History:  Procedure Laterality Date  . APPENDECTOMY    . CATARACT EXTRACTION W/PHACO Right 05/14/2015   Procedure: CATARACT EXTRACTION PHACO AND INTRAOCULAR LENS PLACEMENT RIGHT EYE CDE=6.96;  Surgeon: Williams Che, MD;  Location: [facility];  Service: Ophthalmology;  Laterality: Right;  . CATARACT EXTRACTION W/PHACO Left  05/2019  . COLONOSCOPY W/ BIOPSIES AND POLYPECTOMY  2005  . CORONARY ANGIOPLASTY WITH STENT PLACEMENT  08/21/2016  . CORONARY STENT INTERVENTION N/A 08/21/2016   Procedure: Coronary Stent Intervention;  Surgeon: Burnell Blanks, MD;  Location: [place] [facility];  Service: Cardiovascular;  Laterality: N/A;  . LEFT HEART CATH AND CORONARY ANGIOGRAPHY N/A 08/21/2016   Procedure: Left Heart Cath and Coronary Angiography;  Surgeon: Burnell Blanks, MD;  Location: [place] CV LAB;  Service: Cardiovascular;  Laterality: N/A;  . TONSILLECTOMY      FAMILY HISTORY Family History  Problem Relation Age of Onset  . Cancer Father   . Heart failure Father   . Hyperlipidemia Father   . Diabetes Sister   . Hypertension Sister   . Cancer Mother   . Cancer Brother   . Cancer Sister     SOCIAL HISTORY Social History   Tobacco Use  . Smoking status: Former Smoker    Packs/day: 1.00    Years: 4.00    Pack years: 4.00    Types: Cigarettes    Quit date: 10/22/1958    Years since quitting: 61.6  . Smokeless tobacco: Never Used  Vaping Use  . Vaping Use: Never used  Substance Use Topics  . Alcohol use: No    Alcohol/week: 0.0 standard drinks  . Drug use: No         OPHTHALMIC EXAM: Base Eye Exam    Visual Acuity (Snellen - Linear)      Right Left   Dist [place] 20/40 -2 20/30 -1   Dist ph [place] 20/30        Tonometry (Tonopen, 10:37 AM)      Right Left   Pressure 17 16       Pupils      Pupils Dark Light Shape React APD   Right PERRL 2 2 Round Minimal None   Left PERRL 2 2 Round Minimal None       Visual Fields (Counting fingers)      Left Right    Full Full       Neuro/Psych    Oriented x3: Yes   Mood/Affect: Normal       Dilation    Both eyes: 1.0% Mydriacyl, 2.5% Phenylephrine @ 10:37 AM        Slit Lamp and Fundus Exam    External Exam      Right Left   External Normal Normal       Slit Lamp Exam      Right Left   Lids/Lashes Normal Normal    Conjunctiva/Sclera White and quiet White and quiet   Cornea Clear Clear   Anterior Chamber Deep and quiet Deep and quiet   Iris Round and reactive Round and reactive   Lens Posterior chamber intraocular lens Posterior chamber intraocular lens   Anterior Vitreous Normal Normal  Fundus Exam      Right Left   Posterior Vitreous Normal Normal   Disc Normal Normal   C/D Ratio 0.0 0.05   Macula Macular thickening, nasal to FAZ, subretinal neovascular membrane less active, Hard drusen Epiretinal membrane   Vessels Normal Normal   Periphery Normal Normal          IMAGING AND PROCEDURES  Imaging and Procedures for 05/14/20  OCT, Retina - OU - Both Eyes       Right Eye Quality was good. Scan locations included subfoveal. Central Foveal Thickness: 367. Progression has been stable. Findings include abnormal foveal contour, subretinal fluid, pigment epithelial detachment.   Left Eye Quality was good. Scan locations included subfoveal. Central Foveal Thickness: 478. Progression has worsened. Findings include abnormal foveal contour, epiretinal membrane.   Notes Subretinal fluid overlying pigment epithelial detachment vascularized, nasal to the fovea OD, stable on Avastin currently at 8-week follow-up. OS with stable epiretinal membrane       Intravitreal Injection, Pharmacologic Agent - OD - Right Eye       Time Out 05/14/2020. 11:37 AM. Confirmed correct patient, procedure, site, and patient consented.   Anesthesia Topical anesthesia was used. Anesthetic medications included Akten 3.5%.   Procedure Preparation included 5% betadine to ocular surface, 10% betadine to eyelids, Tobramycin 0.3%. A 30 gauge needle was used.   Injection:  2.5 mg Bevacizumab (AVASTIN) 2.5mg /0.14mL SOSY   NDC: 78588-502-77, Lot: [id number]   Route: Intravitreal, Site: Right Eye  Post-op Post injection exam found visual acuity of at least counting fingers. The patient tolerated the procedure well.  There were no complications. The patient received written and verbal post procedure care education. Post injection medications were not given.                 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 Exudative age-related macular degeneration of right eye with active choroidal neovascularization (HCC)  H35.3211 OCT, Retina - OU - Both Eyes    Intravitreal Injection, Pharmacologic Agent - OD - Right Eye    bevacizumab (AVASTIN) SOSY 2.5 mg  2. Left epiretinal membrane  H35.372     1.  Noncentral involved wet ARMD OD, vascularized PED nasal to the fovea stabilized at 8-week post injection Avastin, repeat in traction today and examination in 9 weeks  2.  3.  Ophthalmic Meds Ordered this visit:  Meds ordered this encounter  Medications  . bevacizumab (AVASTIN) SOSY 2.5 mg       Return in about 9 weeks (around 07/16/2020) for dilate, OD, AVASTIN OCT.  Patient Instructions  Patient instructed to contact the office promptly if new onset visual acuity declines or distortions    Explained the diagnoses, plan, and follow up with the patient and they expressed understanding.  Patient expressed understanding of the importance of proper follow up care.   Clent Demark Rankin M.D. Diseases & Surgery of the Retina and Vitreous Retina & Diabetic [place] 05/14/20     Abbreviations: M myopia (nearsighted); A astigmatism; H hyperopia (farsighted); P presbyopia; Mrx spectacle prescription;  CTL contact lenses; OD right eye; OS left eye; OU both eyes  XT exotropia;  ET esotropia; PEK punctate epithelial keratitis; PEE punctate epithelial erosions;  DES dry eye syndrome; MGD meibomian gland dysfunction; ATs artificial tears; PFAT's preservative free artificial tears; [place] nuclear sclerotic cataract; PSC posterior subcapsular cataract; ERM epi-retinal membrane; PVD posterior vitreous detachment; RD retinal detachment; DM diabetes mellitus; DR diabetic retinopathy; NPDR non-proliferative diabetic retinopathy; PDR proliferative diabetic retinopathy; CSME clinically significant macular edema; DME diabetic macular edema; dbh dot blot hemorrhages; CWS cotton wool spot; POAG primary open angle glaucoma; C/D cup-to-disc ratio; HVF humphrey visual field; GVF goldmann visual field; OCT optical coherence tomography; IOP intraocular pressure; BRVO Branch retinal vein occlusion; CRVO central retinal vein occlusion; CRAO central retinal artery occlusion; BRAO branch retinal artery occlusion; RT retinal tear; SB scleral buckle; PPV pars plana vitrectomy; VH Vitreous hemorrhage; PRP panretinal laser photocoagulation; IVK intravitreal kenalog; VMT vitreomacular traction; MH Macular hole;  NVD neovascularization of the disc; NVE neovascularization elsewhere; AREDS age related eye disease study; ARMD age related macular degeneration; POAG primary open angle glaucoma; EBMD epithelial/anterior basement membrane dystrophy; ACIOL anterior chamber intraocular lens; IOL intraocular lens; PCIOL posterior chamber intraocular lens; Phaco/IOL phacoemulsification with intraocular lens placement; [place] photorefractive keratectomy; LASIK laser assisted in situ keratomileusis; HTN hypertension; DM diabetes mellitus; COPD chronic obstructive pulmonary disease

## 2020-05-24 ENCOUNTER — Ambulatory Visit (HOSPITAL_COMMUNITY)
Admission: RE | Admit: 2020-05-24 | Discharge: 2020-05-24 | Disposition: A | Discharge status: Home / Self Care | Payer: PPO | Source: Ambulatory Visit | Attending: Family Medicine | Admitting: Family Medicine

## 2020-05-24 ENCOUNTER — Other Ambulatory Visit (HOSPITAL_COMMUNITY): Payer: Self-pay | Admitting: Family Medicine

## 2020-05-24 ENCOUNTER — Other Ambulatory Visit: Payer: Self-pay

## 2020-05-24 DIAGNOSIS — M25552 Pain in left hip: Secondary | ICD-10-CM

## 2020-05-28 ENCOUNTER — Other Ambulatory Visit: Payer: Self-pay

## 2020-05-28 ENCOUNTER — Ambulatory Visit: Payer: PPO | Admitting: Cardiology

## 2020-05-28 ENCOUNTER — Encounter: Payer: Self-pay | Admitting: Cardiology

## 2020-05-28 VITALS — BP 140/70 | HR 64 | Ht 69.0 in | Wt 196.4 lb

## 2020-05-28 DIAGNOSIS — E782 Mixed hyperlipidemia: Secondary | ICD-10-CM | POA: Diagnosis not present

## 2020-05-28 DIAGNOSIS — I25119 Atherosclerotic heart disease of native coronary artery with unspecified angina pectoris: Secondary | ICD-10-CM | POA: Diagnosis not present

## 2020-05-28 NOTE — Progress Notes (Signed)
Cardiology Office Note  Date: 05/28/2020   ID: [name] [name], DOB 1940-08-20, MRN [id number]  PCP:  Celene Squibb, MD  Cardiologist:  Rozann Lesches, MD Electrophysiologist:  None   Chief Complaint  Patient presents with  . Cardiac follow-up    History of Present Illness: [name] [name] is an 80 y.o. male last seen in July 2021.  He presents for a routine visit.  Reports no angina symptoms or nitroglycerin use since last encounter.  He has been having some trouble with left hip pain, states that recent plain films showed evidence of arthritis.  I reviewed his recent lab work from February as outlined below.  He continues on a stable cardiac medical regimen as outlined below.  Also following with Dr. Nevada Crane for primary care.  Past Medical History:  Diagnosis Date  . Age-related macular degeneration, dry, right eye   . Arthritis   . Coronary atherosclerosis of native coronary artery    PTCA/stent to LAD and diagonal 2001; DES to LAD, DES to RCA, DES x 2 to circumflex/OM1 07/2016  . CVA (cerebral vascular accident) ([place]) 01/2011   Resolved dysarthria and right hemiparesis  . CVA (cerebral vascular accident) ([place]) 06/2014  . Drug intolerance    ACE inhibitor; related to cough   . Essential hypertension   . Hyperlipidemia   . MI (myocardial infarction) ([place]) 2000  . Partial small bowel obstruction ([place]) 12//2012   Resolved spontaneously.  . Refusal of blood transfusions as patient is [name]     Past Surgical History:  Procedure Laterality Date  . APPENDECTOMY    . CATARACT EXTRACTION W/PHACO Right 05/14/2015   Procedure: CATARACT EXTRACTION PHACO AND INTRAOCULAR LENS PLACEMENT RIGHT EYE CDE=6.96;  Surgeon: Williams Che, MD;  Location: [facility];  Service: Ophthalmology;  Laterality: Right;  . CATARACT EXTRACTION W/PHACO Left 05/2019  . COLONOSCOPY W/ BIOPSIES AND POLYPECTOMY  2005  . CORONARY ANGIOPLASTY WITH STENT PLACEMENT  08/21/2016  . CORONARY STENT  INTERVENTION N/A 08/21/2016   Procedure: Coronary Stent Intervention;  Surgeon: Burnell Blanks, MD;  Location: [facility];  Service: Cardiovascular;  Laterality: N/A;  . LEFT HEART CATH AND CORONARY ANGIOGRAPHY N/A 08/21/2016   Procedure: Left Heart Cath and Coronary Angiography;  Surgeon: Burnell Blanks, MD;  Location: [facility];  Service: Cardiovascular;  Laterality: N/A;  . TONSILLECTOMY      Current Outpatient Medications  Medication Sig Dispense Refill  . aspirin 81 MG tablet Take 1 tablet (81 mg total) by mouth daily. 30 tablet 11  . Cholecalciferol (VITAMIN D3) 2000 units TABS Take 2,000 Units by mouth daily.    . clopidogrel (PLAVIX) 75 MG tablet Take 75 mg by mouth daily.    . nitroGLYCERIN (NITROSTAT) 0.4 MG SL tablet Place 1 tablet (0.4 mg total) under the tongue every 5 (five) minutes x 3 doses as needed (if no relief after 3rd dose, proceed to the ED for an evaluation or call 911). 25 tablet 3  . polycarbophil (FIBERCON) 625 MG tablet Take 1,250 mg by mouth daily.    [name] Glycol-Propyl Glycol (SYSTANE OP) Place 1 drop into both eyes 3 (three) times daily as needed (Dry Eyes).     Marland Kitchen PROLENSA 0.07 % SOLN Place 1 drop into the left eye daily.    . simvastatin (ZOCOR) 40 MG tablet Take 40 mg by mouth every evening.     Marland Kitchen telmisartan (MICARDIS) 80 MG tablet Take 80 mg by  mouth daily.     Marland Kitchen UNABLE TO FIND 1 Syringe by Intravitreal route See admin instructions. Patient receives eye injections every 6 to 8 weeks.     No current facility-administered medications for this visit.   Allergies:  Penicillins   ROS: No palpitations or syncope.  Physical Exam: VS:  BP 140/70   Pulse 64   Ht 5\' 9"  (1.753 m)   Wt 196 lb 6.4 oz (89.1 kg)   SpO2 98%   BMI 29.00 kg/m , BMI Body mass index is 29 kg/m.  Wt Readings from Last 3 Encounters:  05/28/20 196 lb 6.4 oz (89.1 kg)  09/13/19 185 lb 9.6 oz (84.2 kg)  03/15/19 184 lb (83.5 kg)    General:  Patient appears comfortable at rest. HEENT: Conjunctiva and lids normal, wearing a mask. Neck: Supple, no elevated JVP or carotid bruits, no thyromegaly. Lungs: Clear to auscultation, nonlabored breathing at rest. Cardiac: Regular rate and rhythm, no S3 or significant systolic murmur, no pericardial rub. Extremities: No pitting edema.  ECG:  An ECG dated 09/13/2019 was personally reviewed today and demonstrated:  Sinus rhythm with lead motion artifact.  Recent Labwork:  February 2022: Hemoglobin 14.4, platelets 222, BUN 15, creatinine 0.88, potassium 4.4, AST 13, ALT 14, cholesterol 137, triglycerides 102, HDL 37, LDL 81, hemoglobin A1c 6.1%  Other Studies Reviewed Today:  Cardiac catheterization and PCI 08/21/2016:  Mid RCA to Dist RCA lesion, 10 %stenosed.  Post intervention, there is a 0% residual stenosis.  A STENT RESOLUTE ONYX 3.0X22 drug eluting stent was successfully placed, and overlaps previously placed stent.  Prox LAD lesion, 90 %stenosed.  Post intervention, there is a 0% residual stenosis.  A STENT RESOLUTE ONYX 4.09W11 drug eluting stent was successfully placed.  Ost 1st Mrg to 1st Mrg lesion, 99 %stenosed.  Post intervention, there is a 0% residual stenosis.  A STENT RESOLUTE ONYX 3.0X12 drug eluting stent was successfully placed.  Ost Cx to Prox Cx lesion, 99 %stenosed.  Post intervention, there is a 0% residual stenosis.  Mid RCA lesion, 99 %stenosed.  Post intervention, there is a 0% residual stenosis.  A STENT RESOLUTE ONYX 3.0X15 drug eluting stent was successfully placed, and overlaps previously placed stent.  The left ventricular systolic function is normal.  LV end diastolic pressure is normal.  The left ventricular ejection fraction is 55-65% by visual estimate.  There is no mitral valve regurgitation.  Ost 1st Diag to 1st Diag lesion, 60 %stenosed.  RPDA lesion, 30 %stenosed.  Prox RCA lesion, 20 %stenosed.  1. Severe triple vessel  CAD 2. Successful PTCA/DES x 1 distal RCA 3. Successful PTCA/DES x 1 proximal LAD 4. Successful PTCA/DES x 2 proximal Circumflex/OM1 5. Normal LV systolic function  Recommendations: DAPT with ASA and Plavix for lifetime. Start beta blocker at low dose. Continue statin.  Assessment and Plan:  1.  CAD status post DES to the LAD, RCA, and circumflex OM territory as of 2018.  He remains clinically stable, no active angina symptoms on medical therapy.  Continue aspirin, Plavix, Micardis, Zocor, and as needed nitroglycerin.  2.  Mixed hyperlipidemia, remains on Zocor.  Last LDL 81.  Medication Adjustments/Labs and Tests Ordered: Current medicines are reviewed at length with the patient today.  Concerns regarding medicines are outlined above.   Tests Ordered: No orders of the defined types were placed in this encounter.   Medication Changes: No orders of the defined types were placed in this encounter.   Disposition:  Follow  up 6 months in the [place] office.  Signed, Satira Sark, MD, [facility] 05/28/2020 3:38 PM    [facility] [place], [place], [place] [address] Phone: [phone]; Fax: [phone]

## 2020-05-28 NOTE — Patient Instructions (Signed)
Your physician recommends that you schedule a follow-up appointment in: 6 MONTHS WITH DR MCDOWELL  Your physician recommends that you continue on your current medications as directed. Please refer to the Current Medication list given to you today.  Thank you for choosing [facility]!!    

## 2020-05-30 DIAGNOSIS — L570 Actinic keratosis: Secondary | ICD-10-CM | POA: Diagnosis not present

## 2020-05-30 DIAGNOSIS — X32XXXD Exposure to sunlight, subsequent encounter: Secondary | ICD-10-CM | POA: Diagnosis not present

## 2020-06-15 ENCOUNTER — Telehealth: Payer: Self-pay | Admitting: Cardiology

## 2020-06-15 MED ORDER — NITROGLYCERIN 0.4 MG SL SUBL: 0.4000 mg | SUBLINGUAL_TABLET | SUBLINGUAL | 3 refills | Status: DC | PRN

## 2020-06-15 NOTE — Telephone Encounter (Signed)
*  STAT* If patient is at the pharmacy, call can be transferred to refill team.   1. Which medications need to be refilled? (please list name of each medication and dose if known)  nitroGLYCERIN (NITROSTAT) 0.4 MG SL tablet [[id number]]   2. Which pharmacy/location (including street and city if local pharmacy) is medication to be sent to? [facility] on [address]   3. Do they need a 30 day or 90 day supply?

## 2020-06-15 NOTE — Telephone Encounter (Signed)
Medication sent to pharmacy  

## 2020-06-16 DIAGNOSIS — R0982 Postnasal drip: Secondary | ICD-10-CM | POA: Diagnosis not present

## 2020-07-16 ENCOUNTER — Ambulatory Visit (INDEPENDENT_AMBULATORY_CARE_PROVIDER_SITE_OTHER): Payer: PPO | Admitting: Ophthalmology

## 2020-07-16 ENCOUNTER — Other Ambulatory Visit: Payer: Self-pay

## 2020-07-16 ENCOUNTER — Encounter (INDEPENDENT_AMBULATORY_CARE_PROVIDER_SITE_OTHER): Payer: Self-pay | Admitting: Ophthalmology

## 2020-07-16 DIAGNOSIS — H353211 Exudative age-related macular degeneration, right eye, with active choroidal neovascularization: Secondary | ICD-10-CM | POA: Diagnosis not present

## 2020-07-16 DIAGNOSIS — H35372 Puckering of macula, left eye: Secondary | ICD-10-CM | POA: Diagnosis not present

## 2020-07-16 MED ORDER — BEVACIZUMAB 2.5 MG/0.1ML IZ SOSY
2.5000 mg | PREFILLED_SYRINGE | INTRAVITREAL | Status: AC | PRN
  Administered 2020-07-16: 2.5 mg via INTRAVITREAL

## 2020-07-16 NOTE — Assessment & Plan Note (Signed)
No change OS by OCT evaluation and acuity will observe

## 2020-07-16 NOTE — Assessment & Plan Note (Addendum)
Recurrent parafoveal CNVM currently stable at 9 weeks, will repeat injection Avastin today, stable acuity

## 2020-07-16 NOTE — Progress Notes (Signed)
07/16/2020     CHIEF COMPLAINT Patient presents for Macular Degeneration (Follow-up for wet AMD with lesion nasal to the fovea OD, stable acuity per patient)   HISTORY OF PRESENT ILLNESS: [name] [name] is a 80 y.o. male who presents to the clinic today for:   HPI    Macular Degeneration    Comments: Follow-up for wet AMD with lesion nasal to the fovea OD, stable acuity per patient       Last edited by Hurman Horn, MD on 07/16/2020 11:14 AM. (History)      Referring physician: Celene Squibb, MD [place],  [place] [address]  HISTORICAL INFORMATION:   Selected notes from the MEDICAL RECORD NUMBER    Lab Results  Component Value Date   HGBA1C 6.0 (H) 07/14/2014     CURRENT MEDICATIONS: Current Outpatient Medications (Ophthalmic Drugs)  Medication Sig  . Polyethyl Glycol-Propyl Glycol (SYSTANE OP) Place 1 drop into both eyes 3 (three) times daily as needed (Dry Eyes).   Marland Kitchen PROLENSA 0.07 % SOLN Place 1 drop into the left eye daily.   No current facility-administered medications for this visit. (Ophthalmic Drugs)   Current Outpatient Medications (Other)  Medication Sig  . aspirin 81 MG tablet Take 1 tablet (81 mg total) by mouth daily.  . Cholecalciferol (VITAMIN D3) 2000 units TABS Take 2,000 Units by mouth daily.  . clopidogrel (PLAVIX) 75 MG tablet Take 75 mg by mouth daily.  . nitroGLYCERIN (NITROSTAT) 0.4 MG SL tablet Place 1 tablet (0.4 mg total) under the tongue every 5 (five) minutes x 3 doses as needed (if no relief after 3rd dose, proceed to the ED for an evaluation or call 911).  . polycarbophil (FIBERCON) 625 MG tablet Take 1,250 mg by mouth daily.  . simvastatin (ZOCOR) 40 MG tablet Take 40 mg by mouth every evening.   Marland Kitchen telmisartan (MICARDIS) 80 MG tablet Take 80 mg by mouth daily.   Marland Kitchen UNABLE TO FIND 1 Syringe by Intravitreal route See admin instructions. Patient receives eye injections every 6 to 8 weeks.   No current facility-administered  medications for this visit. (Other)      REVIEW OF SYSTEMS: ROS    Negative for: Constitutional, Gastrointestinal, Neurological, Skin, Genitourinary, Musculoskeletal, HENT, Endocrine, Cardiovascular, Eyes, Respiratory, Psychiatric, Allergic/Imm, Heme/Lymph   Last edited by Hurman Horn, MD on 07/16/2020 11:14 AM. (History)       ALLERGIES Allergies  Allergen Reactions  . Penicillins Nausea Only    Has patient had a PCN reaction causing immediate rash, facial/tongue/throat swelling, SOB or lightheadedness with hypotension: No Has patient had a PCN reaction causing severe rash involving mucus membranes or skin necrosis: No Has patient had a PCN reaction that required hospitalization No Has patient had a PCN reaction occurring within the last 10 years: No If all of the above answers are "NO", then may proceed with Cephalosporin use.     PAST MEDICAL HISTORY Past Medical History:  Diagnosis Date  . Age-related macular degeneration, dry, right eye   . Arthritis   . Coronary atherosclerosis of native coronary artery    PTCA/stent to LAD and diagonal 2001; DES to LAD, DES to RCA, DES x 2 to circumflex/OM1 07/2016  . CVA (cerebral vascular accident) ([place]) 01/2011   Resolved dysarthria and right hemiparesis  . CVA (cerebral vascular accident) ([place]) 06/2014  . Drug intolerance    ACE inhibitor; related to cough   . Essential hypertension   .  Hyperlipidemia   . MI (myocardial infarction) ([place]) 2000  . Partial small bowel obstruction ([place]) 12//2012   Resolved spontaneously.  . Refusal of blood transfusions as patient is [name]    Past Surgical History:  Procedure Laterality Date  . APPENDECTOMY    . CATARACT EXTRACTION W/PHACO Right 05/14/2015   Procedure: CATARACT EXTRACTION PHACO AND INTRAOCULAR LENS PLACEMENT RIGHT EYE CDE=6.96;  Surgeon: Williams Che, MD;  Location: [facility];  Service: Ophthalmology;  Laterality: Right;  . CATARACT EXTRACTION W/PHACO Left 05/2019   . COLONOSCOPY W/ BIOPSIES AND POLYPECTOMY  2005  . CORONARY ANGIOPLASTY WITH STENT PLACEMENT  08/21/2016  . CORONARY STENT INTERVENTION N/A 08/21/2016   Procedure: Coronary Stent Intervention;  Surgeon: Burnell Blanks, MD;  Location: [facility];  Service: Cardiovascular;  Laterality: N/A;  . LEFT HEART CATH AND CORONARY ANGIOGRAPHY N/A 08/21/2016   Procedure: Left Heart Cath and Coronary Angiography;  Surgeon: Burnell Blanks, MD;  Location: [facility];  Service: Cardiovascular;  Laterality: N/A;  . TONSILLECTOMY      FAMILY HISTORY Family History  Problem Relation Age of Onset  . Cancer Father   . Heart failure Father   . Hyperlipidemia Father   . Diabetes Sister   . Hypertension Sister   . Cancer Mother   . Cancer Brother   . Cancer Sister     SOCIAL HISTORY Social History   Tobacco Use  . Smoking status: Former Smoker    Packs/day: 1.00    Years: 4.00    Pack years: 4.00    Types: Cigarettes    Quit date: 10/22/1958    Years since quitting: 61.7  . Smokeless tobacco: Never Used  Vaping Use  . Vaping Use: Never used  Substance Use Topics  . Alcohol use: No    Alcohol/week: 0.0 standard drinks  . Drug use: No         OPHTHALMIC EXAM: Base Eye Exam    Visual Acuity (ETDRS)      Right Left   Dist [place] 20/40 20/30   Dist ph [place] NI NI       Tonometry (Tonopen, 10:22 AM)      Right Left   Pressure 15 15       Neuro/Psych    Oriented x3: Yes   Mood/Affect: Normal       Dilation    Right eye: 1.0% Mydriacyl, 2.5% Phenylephrine @ 10:21 AM        Slit Lamp and Fundus Exam    External Exam      Right Left   External Normal Normal       Slit Lamp Exam      Right Left   Lids/Lashes Normal Normal   Conjunctiva/Sclera White and quiet White and quiet   Cornea Clear Clear   Anterior Chamber Deep and quiet Deep and quiet   Iris Round and reactive Round and reactive   Lens Centered posterior chamber intraocular lens Centered  posterior chamber intraocular lens   Anterior Vitreous Normal Normal       Fundus Exam      Right Left   Posterior Vitreous Normal    Disc Normal    C/D Ratio 0.0    Macula Macular thickening, nasal to FAZ, subretinal neovascular membrane less active, Hard drusen    Vessels Normal    Periphery Normal           IMAGING AND PROCEDURES  Imaging and Procedures for 07/16/20  OCT, Retina - OU - Both Eyes       Right Eye Quality was good. Scan locations included subfoveal. Central Foveal Thickness: 362. Progression has been stable. Findings include abnormal foveal contour, subretinal fluid, pigment epithelial detachment.   Left Eye Quality was good. Scan locations included subfoveal. Central Foveal Thickness: 476. Progression has been stable. Findings include abnormal foveal contour, epiretinal membrane.   Notes Subretinal fluid overlying pigment epithelial detachment vascularized, nasal to the fovea OD, stable on Avastin currently at 9-week follow-up. OS with stable epiretinal membrane       Intravitreal Injection, Pharmacologic Agent - OD - Right Eye       Time Out 07/16/2020. 11:15 AM. Confirmed correct patient, procedure, site, and patient consented.   Anesthesia Topical anesthesia was used. Anesthetic medications included Akten 3.5%.   Procedure Preparation included 5% betadine to ocular surface, 10% betadine to eyelids, Tobramycin 0.3%. A 30 gauge needle was used.   Injection:  2.5 mg Bevacizumab (AVASTIN) 2.5mg /0.80mL SOSY   NDC: 74259-563-87, Lot: [id number]   Route: Intravitreal, Site: Right Eye  Post-op Post injection exam found visual acuity of at least counting fingers. The patient tolerated the procedure well. There were no complications. The patient received written and verbal post procedure care education. Post injection medications were not given.                 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 Exudative age-related macular degeneration of right eye with active choroidal neovascularization (HCC)  H35.3211 OCT, Retina - OU - Both Eyes    Intravitreal Injection, Pharmacologic Agent - OD - Right Eye    bevacizumab (AVASTIN) SOSY 2.5 mg  2. Left epiretinal membrane  H35.372     1.  OD, stabilized ARMD, exudative, on intravitreal Avastin currently at 9-week interval.  Vascularized PED no change in size or clinical appearance or OCT appearance  Repeat intravitreal Avastin today  2.  OS acuity stable with severe thickening from epiretinal membrane dilate OU next  3.  Ophthalmic Meds Ordered this visit:  Meds ordered this encounter  Medications  . bevacizumab (AVASTIN) SOSY 2.5 mg       Return in about 9 weeks (around 09/17/2020) for DILATE OU, AVASTIN OCT, OD.  There are no Patient Instructions on file for this visit.   Explained the diagnoses, plan, and follow up with the patient and they expressed understanding.  Patient expressed understanding of the importance of proper follow up care.   Clent Demark Ophelia Sipe M.D. Diseases & Surgery of the Retina and Vitreous [facility] [place] 07/16/20     Abbreviations: M myopia (nearsighted); A astigmatism; H hyperopia (farsighted); P presbyopia; Mrx spectacle prescription;  CTL contact lenses; OD right eye; OS left eye; OU both eyes  XT exotropia; ET esotropia; PEK punctate epithelial keratitis; PEE punctate epithelial erosions; DES dry eye syndrome; MGD meibomian gland dysfunction; ATs artificial tears; PFAT's preservative free artificial tears; [place] nuclear sclerotic cataract; PSC posterior subcapsular cataract; ERM epi-retinal membrane; PVD posterior vitreous detachment; RD  retinal detachment; DM diabetes mellitus; DR diabetic retinopathy; NPDR non-proliferative diabetic retinopathy; PDR proliferative diabetic retinopathy; CSME clinically significant macular edema; DME diabetic macular edema; dbh dot blot hemorrhages; CWS cotton wool spot;  POAG primary open angle glaucoma; C/D cup-to-disc ratio; HVF humphrey visual field; GVF goldmann visual field; OCT optical coherence tomography; IOP intraocular pressure; BRVO Branch retinal vein occlusion; CRVO central retinal vein occlusion; CRAO central retinal artery occlusion; BRAO branch retinal artery occlusion; RT retinal tear; SB scleral buckle; PPV pars plana vitrectomy; VH Vitreous hemorrhage; PRP panretinal laser photocoagulation; IVK intravitreal kenalog; VMT vitreomacular traction; MH Macular hole;  NVD neovascularization of the disc; NVE neovascularization elsewhere; AREDS age related eye disease study; ARMD age related macular degeneration; POAG primary open angle glaucoma; EBMD epithelial/anterior basement membrane dystrophy; ACIOL anterior chamber intraocular lens; IOL intraocular lens; PCIOL posterior chamber intraocular lens; Phaco/IOL phacoemulsification with intraocular lens placement; Breese photorefractive keratectomy; LASIK laser assisted in situ keratomileusis; HTN hypertension; DM diabetes mellitus; COPD chronic obstructive pulmonary disease

## 2020-07-25 DIAGNOSIS — J069 Acute upper respiratory infection, unspecified: Secondary | ICD-10-CM | POA: Diagnosis not present

## 2020-07-26 ENCOUNTER — Telehealth: Payer: Self-pay | Admitting: *Deleted

## 2020-07-26 DIAGNOSIS — R0602 Shortness of breath: Secondary | ICD-10-CM

## 2020-07-26 NOTE — Telephone Encounter (Signed)
Would see if we can get him scheduled for a PA and lateral chest x-ray and an echocardiogram at [facility] soon as possible.  Schedule APP follow-up visit for him to be seen sooner rather than later rather than waiting on my first available slot.

## 2020-07-26 NOTE — Telephone Encounter (Signed)
Wife ([name] [name]) & patient calling with concerns of SOB.  Been going on x 2-3 weeks and is new since last OV in April.   Stated that he saw his pcp a few weeks ago for this - sob & hoarseness.  PCP prescribed some allergy medication.  Tried x 2 weeks & was no better so called them back & was given antibiotic, steroid, and mucinex.  Wife states that he is very concerned about the SOB & is requesting to see Dr. Domenic Polite.  Informed patient that message will be forwarded to provider for advice first.

## 2020-07-27 ENCOUNTER — Ambulatory Visit (HOSPITAL_COMMUNITY)
Admission: RE | Admit: 2020-07-27 | Discharge: 2020-07-27 | Disposition: A | Discharge status: Home / Self Care | Payer: PPO | Source: Ambulatory Visit | Attending: Cardiology | Admitting: Cardiology

## 2020-07-27 ENCOUNTER — Other Ambulatory Visit: Payer: Self-pay

## 2020-07-27 DIAGNOSIS — R0602 Shortness of breath: Secondary | ICD-10-CM | POA: Diagnosis not present

## 2020-07-27 NOTE — Telephone Encounter (Signed)
Tessie Fass (wife) notified Echo scheduled for Monday, 07/30/2020 at 8:00 am here in [place] office.  She verbalized understanding.

## 2020-07-27 NOTE — Telephone Encounter (Signed)
Patsy (wife) notified.  She will take him to [facility] for chest x-ray today. Orders entered & sent to pcc for scheduling.

## 2020-07-30 ENCOUNTER — Ambulatory Visit (INDEPENDENT_AMBULATORY_CARE_PROVIDER_SITE_OTHER): Payer: PPO

## 2020-07-30 DIAGNOSIS — R0602 Shortness of breath: Secondary | ICD-10-CM

## 2020-07-30 LAB — ECHOCARDIOGRAM COMPLETE
Area-P 1/2: 2.91 cm2
Calc EF: 58 %
MV M vel: 4.29 m/s
MV Peak grad: 73.5 mmHg
S' Lateral: 3.52 cm
Single Plane A2C EF: 59.2 %
Single Plane A4C EF: 57.6 %

## 2020-08-01 ENCOUNTER — Telehealth: Payer: Self-pay | Admitting: *Deleted

## 2020-08-01 NOTE — Telephone Encounter (Signed)
-----   Message from Satira Sark, MD sent at 07/30/2020  1:10 PM EDT ----- Results reviewed.  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  No progressive valvular abnormalities.  As it relates to recent shortness of breath, we will likely need to pursue further ischemic work-up depending on discussion of symptoms.

## 2020-08-01 NOTE — Telephone Encounter (Signed)
-----   Message from Satira Sark, MD sent at 07/27/2020  4:17 PM EDT ----- Results reviewed.  See recent telephone note.  Please let them know that chest x-ray was clear.

## 2020-08-01 NOTE — Telephone Encounter (Signed)
Patient informed. Copy sent to PCP °

## 2020-08-28 ENCOUNTER — Ambulatory Visit: Payer: PPO | Admitting: Family Medicine

## 2020-08-28 ENCOUNTER — Encounter: Payer: Self-pay | Admitting: Family Medicine

## 2020-08-28 ENCOUNTER — Other Ambulatory Visit: Payer: Self-pay

## 2020-08-28 VITALS — BP 148/68 | HR 62 | Ht 69.0 in | Wt 191.0 lb

## 2020-08-28 DIAGNOSIS — E782 Mixed hyperlipidemia: Secondary | ICD-10-CM | POA: Diagnosis not present

## 2020-08-28 DIAGNOSIS — I10 Essential (primary) hypertension: Secondary | ICD-10-CM | POA: Diagnosis not present

## 2020-08-28 DIAGNOSIS — R0602 Shortness of breath: Secondary | ICD-10-CM

## 2020-08-28 DIAGNOSIS — I25119 Atherosclerotic heart disease of native coronary artery with unspecified angina pectoris: Secondary | ICD-10-CM | POA: Diagnosis not present

## 2020-08-28 NOTE — Patient Instructions (Signed)
Medication Instructions:  Your physician recommends that you continue on your current medications as directed. Please refer to the Current Medication list given to you today.  *If you need a refill on your cardiac medications before your next appointment, please call your pharmacy*   Lab Work: None today  If you have labs (blood work) drawn today and your tests are completely normal, you will receive your results only by: MyChart Message (if you have MyChart) OR A paper copy in the mail If you have any lab test that is abnormal or we need to change your treatment, we will call you to review the results.   Testing/Procedures: None today    Follow-Up: At [facility], you and your health needs are our priority.  As part of our continuing mission to provide you with exceptional heart care, we have created designated Provider Care Teams.  These Care Teams include your primary Cardiologist (physician) and Advanced Practice Providers (APPs -  Physician Assistants and Nurse Practitioners) who all work together to provide you with the care you need, when you need it.  We recommend signing up for the patient portal called "MyChart".  Sign up information is provided on this After Visit Summary.  MyChart is used to connect with patients for Virtual Visits (Telemedicine).  Patients are able to view lab/test results, encounter notes, upcoming appointments, etc.  Non-urgent messages can be sent to your provider as well.   To learn more about what you can do with MyChart, go to [email or web address].    Your next appointment:   6 month(s)  The format for your next appointment:   In Person  Provider:   Andy Quinn, NP   Other Instructions None   

## 2020-08-28 NOTE — Progress Notes (Signed)
Cardiology Office Note  Date: 08/28/2020   ID: KA FLAMMER, DOB 1941/02/08, MRN [id number]  PCP:  Celene Squibb, MD  Cardiologist:  Rozann Lesches, MD Electrophysiologist:  None   Chief Complaint: SOB / follow up Echo  History of Present Illness: [name] [name] is a 80 y.o. male with a history of CAD, unstable angina, HTN, CVA, macular degeneration, HLD.   He was last seen by Dr. Domenic Polite on 05/28/2020 for routine visit.  He reported no anginal symptoms or nitroglycerin use.  He had been having some left hip pain with recent evidence of arthritis on x-ray.  He was continuing on stable cardiac regimen.  Following with Dr. Nevada Crane for primary care.  He was continuing aspirin, Plavix, myocarditis, Zocor and as needed nitroglycerin.  He was continuing Zocor with last LDL of 81.  Patient's wife called on July 26, 2020 with concerns of shortness of breath which had been ongoing for 2 to 3 weeks since last office visit in April.  Subsequently echocardiogram and CXR were ordered.  He is here for follow-up today status post recent echocardiogram and chest x-ray for complaints of shortness of breath.  Chest x-ray showed no active cardiopulmonary disease. Echocardiogram on 07/30/2020 demonstrated EF of 55-60.  Positive for regional wall motion abnormalities.  Normal PASP, mild mitral regurgitation.  Patient states that shortness of breath all started when he was at [facility] a few weeks ago and became choked on something.  He states he was close to passing out when he finally coughed up the substance which was causing the choking.  He states since that time his breathing and problems with swallowing have continued.  He states he will be seeing an ear nose and throat physician in the near future for continuing swallowing problems.  Blood pressure is elevated today at 148/68.  Previous blood pressure on 05/28/2020 was 140/70.  Blood pressure prior to that on September 13, 2019 was 130/70.  He states he takes all of  his medications as directed.  Current cardiac regimen include aspirin 81 mg p.o. daily, Plavix 75 mg p.o. daily, simvastatin 40 mg p.o. daily, telmisartan 80 mg p.o. daily, as needed nitroglycerin.   Past Medical History:  Diagnosis Date   Age-related macular degeneration, dry, right eye    Arthritis    Coronary atherosclerosis of native coronary artery    PTCA/stent to LAD and diagonal 2001; DES to LAD, DES to RCA, DES x 2 to circumflex/OM1 07/2016   CVA (cerebral vascular accident) [facility]) 01/2011   Resolved dysarthria and right hemiparesis   CVA (cerebral vascular accident) ([place]) 06/2014   Drug intolerance    ACE inhibitor; related to cough    Essential hypertension    Hyperlipidemia    MI (myocardial infarction) ([place]) 2000   Partial small bowel obstruction ([place]) 12//2012   Resolved spontaneously.   Refusal of blood transfusions as patient is [name]     Past Surgical History:  Procedure Laterality Date   APPENDECTOMY     CATARACT EXTRACTION W/PHACO Right 05/14/2015   Procedure: CATARACT EXTRACTION PHACO AND INTRAOCULAR LENS PLACEMENT RIGHT EYE CDE=6.96;  Surgeon: Williams Che, MD;  Location: [facility];  Service: Ophthalmology;  Laterality: Right;   CATARACT EXTRACTION W/PHACO Left 05/2019   COLONOSCOPY W/ BIOPSIES AND POLYPECTOMY  2005   CORONARY ANGIOPLASTY WITH STENT PLACEMENT  08/21/2016   CORONARY STENT INTERVENTION N/A 08/21/2016   Procedure: Coronary Stent Intervention;  Surgeon: Burnell Blanks, MD;  Location:  [facility];  Service: Cardiovascular;  Laterality: N/A;   LEFT HEART CATH AND CORONARY ANGIOGRAPHY N/A 08/21/2016   Procedure: Left Heart Cath and Coronary Angiography;  Surgeon: Burnell Blanks, MD;  Location: [place] CV [facility];  Service: Cardiovascular;  Laterality: N/A;   TONSILLECTOMY      Current Outpatient Medications  Medication Sig Dispense Refill   aspirin 81 MG tablet Take 1 tablet (81 mg total) by mouth daily. 30 tablet  11   Cholecalciferol (VITAMIN D3) 2000 units TABS Take 2,000 Units by mouth daily.     clopidogrel (PLAVIX) 75 MG tablet Take 75 mg by mouth daily.     nitroGLYCERIN (NITROSTAT) 0.4 MG SL tablet Place 1 tablet (0.4 mg total) under the tongue every 5 (five) minutes x 3 doses as needed (if no relief after 3rd dose, proceed to the ED for an evaluation or call 911). 25 tablet 3   polycarbophil (FIBERCON) 625 MG tablet Take 1,250 mg by mouth daily.     Polyethyl Glycol-Propyl Glycol (SYSTANE OP) Place 1 drop into both eyes 3 (three) times daily as needed (Dry Eyes).      PROLENSA 0.07 % SOLN Place 1 drop into the left eye daily.     simvastatin (ZOCOR) 40 MG tablet Take 40 mg by mouth every evening.      telmisartan (MICARDIS) 80 MG tablet Take 80 mg by mouth daily.      UNABLE TO FIND 1 Syringe by Intravitreal route See admin instructions. Patient receives eye injections every 6 to 8 weeks.     No current facility-administered medications for this visit.   Allergies:  Penicillins   Social History: The patient  reports that he quit smoking about 61 years ago. His smoking use included cigarettes. He has a 4.00 pack-year smoking history. He has never used smokeless tobacco. He reports that he does not drink alcohol and does not use drugs.   Family History: The patient's family history includes Cancer in his brother, father, mother, and sister; Diabetes in his sister; Heart failure in his father; Hyperlipidemia in his father; Hypertension in his sister.   ROS:  Please see the history of present illness. Otherwise, complete review of systems is positive for none.  All other systems are reviewed and negative.   Physical Exam: VS:  BP (!) 148/68   Pulse 62   Ht 5\' 9"  (1.753 m)   Wt 191 lb (86.6 kg)   SpO2 98%   BMI 28.21 kg/m , BMI Body mass index is 28.21 kg/m.  Wt Readings from Last 3 Encounters:  08/28/20 191 lb (86.6 kg)  05/28/20 196 lb 6.4 oz (89.1 kg)  09/13/19 185 lb 9.6 oz (84.2 kg)     General: Patient appears comfortable at rest. Neck: Supple, no elevated JVP or carotid bruits, no thyromegaly. Lungs: Clear to auscultation, nonlabored breathing at rest. Cardiac: Regular rate and rhythm, no S3 or significant systolic murmur, no pericardial rub. Extremities: No pitting edema, distal pulses 2+. Skin: Warm and dry. Musculoskeletal: No kyphosis. Neuropsychiatric: Alert and oriented x3, affect grossly appropriate.  ECG: August 28, 2020 normal sinus rhythm rate of 62, possible anterior infarct, age undetermined.  Recent Labwork: No results found for requested labs within last 8760 hours.     Component Value Date/Time   CHOL 158 07/14/2014 0910   TRIG 136 07/14/2014 0910   HDL 36 (L) 07/14/2014 0910   CHOLHDL 4.4 07/14/2014 0910   VLDL 27 07/14/2014 0910   LDLCALC  95 07/14/2014 0910    Other Studies Reviewed Today:  Echocardiogram 07/30/2020   1. Left ventricular ejection fraction, by estimation, is 55 to 60%. The left ventricle has normal function. The left ventricle demonstrates regional wall motion abnormalities (see scoring diagram/findings for description). Left ventricular diastolic parameters are indeterminate. Normal global longitudinal strain of -18.8%. 2. Right ventricular systolic function is normal. The right ventricular size is normal. There is normal pulmonary artery systolic pressure. The estimated right ventricular systolic pressure is 92.9 mmHg. 3. The mitral valve is grossly normal. Mild mitral valve regurgitation. 4. The aortic valve is tricuspid. Aortic valve regurgitation is not visualized. 5. The inferior vena cava is normal in size with greater than 50% respiratory variability, suggesting right atrial pressure of 3 mmHg. Comparison(s): Echocardiogram done 07/15/14 showed an EF of 60-65%.  Assessment and Plan:  1. SOB (shortness of breath)   2. Coronary artery disease involving native coronary artery of native heart with angina pectoris ([place])    3. Mixed hyperlipidemia   4. Essential hypertension    1. SOB (shortness of breath) States he continues with some shortness of breath and describes an episode a few weeks ago prior to the onset of shortness of breath when he was at [employer] in [place] and became choked on an object and nearly passed out.  He states since that time he has been having issues with shortness of breath and problems swallowing.  We discussed recent echocardiogram results as well as chest x-ray results.  Echocardiogram demonstrated EF 55-60.  Positive for regional wall motion abnormalities.  Normal PASP, mitral valve with mild regurgitation.  Chest x-ray was negative for acute process.  Patient states he has an upcoming appointment with ENT for issues with swallowing since his choking incident.  2. Coronary artery disease involving native coronary artery of native heart with angina pectoris [facility]) He denies any current anginal or exertional symptoms.  No nitroglycerin use.  Continue aspirin 81 mg daily, Plavix 75 mg daily, sublingual nitroglycerin as needed,  3. Mixed hyperlipidemia Continue simvastatin 40 mg daily.  4. Essential hypertension Blood pressure elevated today at 148/68.  Patient states he does not check his blood pressures at home.  Previous blood pressures on 05/28/2020 were 140/70 and September 13, 2019 130/70.  Continue telmisartan 80 mg p.o. daily.  Medication Adjustments/Labs and Tests Ordered: Current medicines are reviewed at length with the patient today.  Concerns regarding medicines are outlined above.   Disposition: Follow-up with Dr. Domenic Polite or APP 6 months  Signed, Levell July, NP 08/28/2020 2:45 PM    [facility] [place], [place], [place] [address] Phone: [phone]; Fax: [phone]

## 2020-09-06 ENCOUNTER — Other Ambulatory Visit (HOSPITAL_COMMUNITY): Payer: Self-pay | Admitting: Otolaryngology

## 2020-09-06 DIAGNOSIS — R1312 Dysphagia, oropharyngeal phase: Secondary | ICD-10-CM | POA: Diagnosis not present

## 2020-09-06 DIAGNOSIS — R490 Dysphonia: Secondary | ICD-10-CM | POA: Diagnosis not present

## 2020-09-17 ENCOUNTER — Other Ambulatory Visit: Payer: Self-pay

## 2020-09-17 ENCOUNTER — Encounter (INDEPENDENT_AMBULATORY_CARE_PROVIDER_SITE_OTHER): Payer: Self-pay | Admitting: Ophthalmology

## 2020-09-17 ENCOUNTER — Ambulatory Visit (INDEPENDENT_AMBULATORY_CARE_PROVIDER_SITE_OTHER): Payer: PPO | Admitting: Ophthalmology

## 2020-09-17 DIAGNOSIS — H35372 Puckering of macula, left eye: Secondary | ICD-10-CM

## 2020-09-17 DIAGNOSIS — H353211 Exudative age-related macular degeneration, right eye, with active choroidal neovascularization: Secondary | ICD-10-CM

## 2020-09-17 MED ORDER — BEVACIZUMAB 2.5 MG/0.1ML IZ SOSY
2.5000 mg | PREFILLED_SYRINGE | INTRAVITREAL | Status: AC | PRN
  Administered 2020-09-17: 2.5 mg via INTRAVITREAL

## 2020-09-17 NOTE — Assessment & Plan Note (Signed)
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  Clinically can still see small reddish hue in this region.  We will repeat injection today and examination next in 10 weeks

## 2020-09-17 NOTE — Progress Notes (Signed)
09/17/2020     CHIEF COMPLAINT Patient presents for Retina Follow Up (9 week fu ou and Avastin OD/Pt states VA OU stable since last visit. Pt denies FOL, floaters, or ocular pain OU. /)   HISTORY OF PRESENT ILLNESS: [name] [name] is a 80 y.o. male who presents to the clinic today for:   HPI     Retina Follow Up           Diagnosis: Wet AMD   Laterality: right eye   Onset: 9 weeks ago   Severity: mild   Duration: 9 weeks   Course: stable   Comments: 9 week fu ou and Avastin OD Pt states VA OU stable since last visit. Pt denies FOL, floaters, or ocular pain OU.         Last edited by Kendra Opitz, COA on 09/17/2020 10:02 AM.      Referring physician: Celene Squibb, MD [address] [place],  [place] [address]  HISTORICAL INFORMATION:   Selected notes from the MEDICAL RECORD NUMBER    Lab Results  Component Value Date   HGBA1C 6.0 (H) 07/14/2014     CURRENT MEDICATIONS: Current Outpatient Medications (Ophthalmic Drugs)  Medication Sig   Polyethyl Glycol-Propyl Glycol (SYSTANE OP) Place 1 drop into both eyes 3 (three) times daily as needed (Dry Eyes).    PROLENSA 0.07 % SOLN Place 1 drop into the left eye daily.   No current facility-administered medications for this visit. (Ophthalmic Drugs)   Current Outpatient Medications (Other)  Medication Sig   aspirin 81 MG tablet Take 1 tablet (81 mg total) by mouth daily.   Cholecalciferol (VITAMIN D3) 2000 units TABS Take 2,000 Units by mouth daily.   clopidogrel (PLAVIX) 75 MG tablet Take 75 mg by mouth daily.   nitroGLYCERIN (NITROSTAT) 0.4 MG SL tablet Place 1 tablet (0.4 mg total) under the tongue every 5 (five) minutes x 3 doses as needed (if no relief after 3rd dose, proceed to the ED for an evaluation or call 911).   polycarbophil (FIBERCON) 625 MG tablet Take 1,250 mg by mouth daily.   simvastatin (ZOCOR) 40 MG tablet Take 40 mg by mouth every evening.    telmisartan (MICARDIS) 80 MG tablet Take 80 mg  by mouth daily.    UNABLE TO FIND 1 Syringe by Intravitreal route See admin instructions. Patient receives eye injections every 6 to 8 weeks.   No current facility-administered medications for this visit. (Other)      REVIEW OF SYSTEMS:    ALLERGIES Allergies  Allergen Reactions   Penicillins Nausea Only    Has patient had a PCN reaction causing immediate rash, facial/tongue/throat swelling, SOB or lightheadedness with hypotension: No Has patient had a PCN reaction causing severe rash involving mucus membranes or skin necrosis: No Has patient had a PCN reaction that required hospitalization No Has patient had a PCN reaction occurring within the last 10 years: No If all of the above answers are "NO", then may proceed with Cephalosporin use.     PAST MEDICAL HISTORY Past Medical History:  Diagnosis Date   Age-related macular degeneration, dry, right eye    Arthritis    Coronary atherosclerosis of native coronary artery    PTCA/stent to LAD and diagonal 2001; DES to LAD, DES to RCA, DES x 2 to circumflex/OM1 07/2016   CVA (cerebral vascular accident) [facility]) 01/2011   Resolved dysarthria and right hemiparesis   CVA (cerebral vascular accident) ([employer]) 06/2014  Drug intolerance    ACE inhibitor; related to cough    Essential hypertension    Hyperlipidemia    MI (myocardial infarction) ([place]) 2000   Partial small bowel obstruction ([place]) 12//2012   Resolved spontaneously.   Refusal of blood transfusions as patient is [name]    Past Surgical History:  Procedure Laterality Date   APPENDECTOMY     CATARACT EXTRACTION W/PHACO Right 05/14/2015   Procedure: CATARACT EXTRACTION PHACO AND INTRAOCULAR LENS PLACEMENT RIGHT EYE CDE=6.96;  Surgeon: Williams Che, MD;  Location: [facility];  Service: Ophthalmology;  Laterality: Right;   CATARACT EXTRACTION W/PHACO Left 05/2019   COLONOSCOPY W/ BIOPSIES AND POLYPECTOMY  2005   CORONARY ANGIOPLASTY WITH STENT PLACEMENT  08/21/2016    CORONARY STENT INTERVENTION N/A 08/21/2016   Procedure: Coronary Stent Intervention;  Surgeon: Burnell Blanks, MD;  Location: [place] CV LAB;  Service: Cardiovascular;  Laterality: N/A;   LEFT HEART CATH AND CORONARY ANGIOGRAPHY N/A 08/21/2016   Procedure: Left Heart Cath and Coronary Angiography;  Surgeon: Burnell Blanks, MD;  Location: [place] CV LAB;  Service: Cardiovascular;  Laterality: N/A;   TONSILLECTOMY      FAMILY HISTORY Family History  Problem Relation Age of Onset   Cancer Father    Heart failure Father    Hyperlipidemia Father    Diabetes Sister    Hypertension Sister    Cancer Mother    Cancer Brother    Cancer Sister     SOCIAL HISTORY Social History   Tobacco Use   Smoking status: Former    Packs/day: 1.00    Years: 4.00    Pack years: 4.00    Types: Cigarettes    Quit date: 10/22/1958    Years since quitting: 61.9   Smokeless tobacco: Never  Vaping Use   Vaping Use: Never used  Substance Use Topics   Alcohol use: No    Alcohol/week: 0.0 standard drinks   Drug use: No         OPHTHALMIC EXAM:  Base Eye Exam     Visual Acuity (ETDRS)       Right Left   Dist [place] 20/60 20/30 -2   Dist ph [place] 20/30 +1 NI         Tonometry (Tonopen, 10:06 AM)       Right Left   Pressure 15 11         Pupils       Pupils Dark Light Shape React APD   Right PERRL 2 2 Round Minimal None   Left PERRL 2 2 Round Minimal None         Visual Fields (Counting fingers)       Left Right    Full Full         Extraocular Movement       Right Left    Full Full         Neuro/Psych     Oriented x3: Yes   Mood/Affect: Normal         Dilation     Right eye: 1.0% Mydriacyl, 2.5% Phenylephrine @ 10:06 AM           Slit Lamp and Fundus Exam     External Exam       Right Left   External Normal Normal         Slit Lamp Exam       Right Left   Lids/Lashes Normal Normal   Conjunctiva/Sclera White and  quiet  White and quiet   Cornea Clear Clear   Anterior Chamber Deep and quiet Deep and quiet   Iris Round and reactive Round and reactive   Lens Centered posterior chamber intraocular lens Centered posterior chamber intraocular lens   Anterior Vitreous Normal Normal         Fundus Exam       Right Left   Posterior Vitreous Normal Normal   Disc Normal Normal   C/D Ratio 0.0 0.05   Macula Macular thickening, nasal to FAZ, subretinal neovascular membrane less active, Hard drusen Epiretinal membrane, severe topographic distortion OS   Vessels Normal Normal   Periphery Normal Normal            IMAGING AND PROCEDURES  Imaging and Procedures for 09/17/20  OCT, Retina - OU - Both Eyes       Right Eye Quality was good. Scan locations included subfoveal. Central Foveal Thickness: 368. Progression has been stable. Findings include abnormal foveal contour, subretinal fluid, pigment epithelial detachment.   Left Eye Quality was good. Scan locations included subfoveal. Central Foveal Thickness: 477. Progression has been stable. Findings include abnormal foveal contour, epiretinal membrane.   Notes Subretinal fluid overlying pigment epithelial detachment vascularized, nasal to the fovea OD, stable on Avastin currently at 9-week follow-up.   OS with severe, thickening, stable epiretinal membrane     Intravitreal Injection, Pharmacologic Agent - OD - Right Eye       Time Out 09/17/2020. 10:46 AM. Confirmed correct patient, procedure, site, and patient consented.   Anesthesia Topical anesthesia was used. Anesthetic medications included Akten 3.5%.   Procedure Preparation included 5% betadine to ocular surface, 10% betadine to eyelids, Tobramycin 0.3%. A 30 gauge needle was used.   Injection: 2.5 mg bevacizumab 2.5 MG/0.1ML   Route: Intravitreal, Site: Right Eye   NDC: [phone]   Post-op Post injection exam found visual acuity of at least counting fingers. The patient  tolerated the procedure well. There were no complications. The patient received written and verbal post procedure care education. Post injection medications were not given.              ASSESSMENT/PLAN:  Left epiretinal membrane OS, severe epiretinal membrane continuing to worsen.  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  Clinically can still see small reddish hue in this region.  We will repeat injection today and examination next in 10 weeks     ICD-10-CM   1. Exudative age-related macular degeneration of right eye with active choroidal neovascularization (HCC)  H35.3211 OCT, Retina - OU - Both Eyes    Intravitreal Injection, Pharmacologic Agent - OD - Right Eye    bevacizumab (AVASTIN) SOSY 2.5 mg    2. Left epiretinal membrane  H35.372       1.  OD, improved macular findings nasal to the fovea left eye.  Vascularized PED no progression with less intraretinal fluid and subretinal fluid.  At 9-week interval.  Repeat injection today Avastin and extend interval examination OD next to 10 weeks  2.  OS with severe epiretinal membrane clinically and by OCT yet patient has no troubles to the vision particularly no near vision troubles  3.  Ophthalmic Meds Ordered this visit:  Meds ordered this encounter  Medications   bevacizumab (AVASTIN) SOSY 2.5 mg  Return in about 10 weeks (around 11/26/2020) for dilate, OD, AVASTIN OCT.  There are no Patient Instructions on file for this visit.   Explained the diagnoses, plan, and follow up with the patient and they expressed understanding.  Patient expressed understanding of the importance of proper follow up care.   Clent Demark Kalandra Masters M.D. Diseases & Surgery of the Retina and Vitreous Retina  & Diabetic [place] 09/17/20     Abbreviations: M myopia (nearsighted); A astigmatism; H hyperopia (farsighted); P presbyopia; Mrx spectacle prescription;  CTL contact lenses; OD right eye; OS left eye; OU both eyes  XT exotropia; ET esotropia; PEK punctate epithelial keratitis; PEE punctate epithelial erosions; DES dry eye syndrome; MGD meibomian gland dysfunction; ATs artificial tears; PFAT's preservative free artificial tears; [place] nuclear sclerotic cataract; PSC posterior subcapsular cataract; ERM epi-retinal membrane; PVD posterior vitreous detachment; RD retinal detachment; DM diabetes mellitus; DR diabetic retinopathy; NPDR non-proliferative diabetic retinopathy; PDR proliferative diabetic retinopathy; CSME clinically significant macular edema; DME diabetic macular edema; dbh dot blot hemorrhages; CWS cotton wool spot; POAG primary open angle glaucoma; C/D cup-to-disc ratio; HVF humphrey visual field; GVF goldmann visual field; OCT optical coherence tomography; IOP intraocular pressure; BRVO Branch retinal vein occlusion; CRVO central retinal vein occlusion; CRAO central retinal artery occlusion; BRAO branch retinal artery occlusion; RT retinal tear; SB scleral buckle; PPV pars plana vitrectomy; VH Vitreous hemorrhage; PRP panretinal laser photocoagulation; IVK intravitreal kenalog; VMT vitreomacular traction; MH Macular hole;  NVD neovascularization of the disc; NVE neovascularization elsewhere; AREDS age related eye disease study; ARMD age related macular degeneration; POAG primary open angle glaucoma; EBMD epithelial/anterior basement membrane dystrophy; ACIOL anterior chamber intraocular lens; IOL intraocular lens; PCIOL posterior chamber intraocular lens; Phaco/IOL phacoemulsification with intraocular lens placement; [place] photorefractive keratectomy; LASIK laser assisted in situ keratomileusis; HTN hypertension; DM diabetes mellitus; COPD chronic obstructive pulmonary disease

## 2020-09-17 NOTE — Assessment & Plan Note (Signed)
OS, severe epiretinal membrane continuing to worsen.  Yet patient has functional vision and reading vision in the left eye remaining

## 2020-09-18 ENCOUNTER — Ambulatory Visit (HOSPITAL_COMMUNITY)
Admission: RE | Admit: 2020-09-18 | Discharge: 2020-09-18 | Disposition: A | Discharge status: Home / Self Care | Payer: PPO | Source: Ambulatory Visit | Attending: Otolaryngology | Admitting: Otolaryngology

## 2020-09-18 DIAGNOSIS — R0989 Other specified symptoms and signs involving the circulatory and respiratory systems: Secondary | ICD-10-CM | POA: Diagnosis not present

## 2020-09-18 DIAGNOSIS — R1312 Dysphagia, oropharyngeal phase: Secondary | ICD-10-CM

## 2020-09-18 DIAGNOSIS — T17320A Food in larynx causing asphyxiation, initial encounter: Secondary | ICD-10-CM | POA: Diagnosis not present

## 2020-10-01 ENCOUNTER — Other Ambulatory Visit (HOSPITAL_COMMUNITY): Payer: Self-pay | Admitting: Specialist

## 2020-10-01 DIAGNOSIS — T17908S Unspecified foreign body in respiratory tract, part unspecified causing other injury, sequela: Secondary | ICD-10-CM

## 2020-10-08 ENCOUNTER — Ambulatory Visit (HOSPITAL_COMMUNITY)
Admission: RE | Admit: 2020-10-08 | Discharge: 2020-10-08 | Disposition: A | Discharge status: Home / Self Care | Payer: PPO | Source: Ambulatory Visit | Attending: Otolaryngology | Admitting: Otolaryngology

## 2020-10-08 ENCOUNTER — Ambulatory Visit (HOSPITAL_COMMUNITY): Payer: PPO | Attending: Otolaryngology | Admitting: Speech Pathology

## 2020-10-08 ENCOUNTER — Other Ambulatory Visit: Payer: Self-pay

## 2020-10-08 ENCOUNTER — Encounter (HOSPITAL_COMMUNITY): Payer: Self-pay | Admitting: Speech Pathology

## 2020-10-08 DIAGNOSIS — R1312 Dysphagia, oropharyngeal phase: Secondary | ICD-10-CM | POA: Diagnosis not present

## 2020-10-08 DIAGNOSIS — T17908S Unspecified foreign body in respiratory tract, part unspecified causing other injury, sequela: Secondary | ICD-10-CM | POA: Insufficient documentation

## 2020-10-08 NOTE — Therapy (Signed)
[name], [place], [address] Phone: [phone]   Fax:  [phone]  Modified Barium Swallow  Patient Details  Name: [name] [name] MRN: [id number] Date of Birth: 1941/01/23 No data recorded  Encounter Date: 10/08/2020   End of Session - 10/08/20 1500     Visit Number 1    Number of Visits 1    Authorization Type [employer]    SLP Start Time 1140    SLP Stop Time  1210    SLP Time Calculation (min) 30 min    Activity Tolerance Patient tolerated treatment well             Past Medical History:  Diagnosis Date   Age-related macular degeneration, dry, right eye    Arthritis    Coronary atherosclerosis of native coronary artery    PTCA/stent to LAD and diagonal 2001; DES to LAD, DES to RCA, DES x 2 to circumflex/OM1 07/2016   CVA (cerebral vascular accident) ([place]) 01/2011   Resolved dysarthria and right hemiparesis   CVA (cerebral vascular accident) ([place]) 06/2014   Drug intolerance    ACE inhibitor; related to cough    Essential hypertension    Hyperlipidemia    MI (myocardial infarction) ([place]) 2000   Partial small bowel obstruction ([place]) 12//2012   Resolved spontaneously.   Refusal of blood transfusions as patient is [name]     Past Surgical History:  Procedure Laterality Date   APPENDECTOMY     CATARACT EXTRACTION W/PHACO Right 05/14/2015   Procedure: CATARACT EXTRACTION PHACO AND INTRAOCULAR LENS PLACEMENT RIGHT EYE CDE=6.96;  Surgeon: Williams Che, MD;  Location: [facility];  Service: Ophthalmology;  Laterality: Right;   CATARACT EXTRACTION W/PHACO Left 05/2019   COLONOSCOPY W/ BIOPSIES AND POLYPECTOMY  2005   CORONARY ANGIOPLASTY WITH STENT PLACEMENT  08/21/2016   CORONARY STENT INTERVENTION N/A 08/21/2016   Procedure: Coronary Stent Intervention;  Surgeon: Burnell Blanks, MD;  Location: [facility];  Service: Cardiovascular;  Laterality: N/A;   LEFT  HEART CATH AND CORONARY ANGIOGRAPHY N/A 08/21/2016   Procedure: Left Heart Cath and Coronary Angiography;  Surgeon: Burnell Blanks, MD;  Location: [facility];  Service: Cardiovascular;  Laterality: N/A;   TONSILLECTOMY      There were no vitals filed for this visit.   Subjective Assessment - 10/08/20 1451     Subjective "I got choked on a burger at [employer]."    Special Tests MBSS    Currently in Pain? No/denies              General - 10/08/20 1452       General Information   Date of Onset 09/18/20    HPI [name] [name] is an 80 yo male who was referred for MBSS by Dr. Benjamine Mola due to aspiration noted on recent barium swallow (09/18/20). Pt has a history of CAD, unstable angina, HTN, CVA, macular degeneration, HLD. Pt reports a choking episode on a burger while at [employer] a few months ago. He also reports excess saliva in his mouth at nights and "getting choked" on his saliva. The barium swallow showed a tiny hiatal hernia with a mucosal ring and aspiration of barium when Pt took sequential, large sips. Most recent chest x-ray was unremarkable. Pt denies recent bouts of PNA.    Type of Study MBS-Modified Barium Swallow Study    Previous Swallow Assessment Barium swallow 09/18/20  Diet Prior to this Study Regular;Thin liquids    Temperature Spikes Noted No    Respiratory Status Room air    History of Recent Intubation No    Behavior/Cognition Alert;Cooperative;Pleasant mood    Oral Cavity Assessment Within Functional Limits    Oral Care Completed by SLP No    Oral Cavity - Dentition Dentures, top    Vision Functional for self feeding    Self-Feeding Abilities Able to feed self    Patient Positioning Upright in chair    Baseline Vocal Quality Normal;Hoarse   mild hoarse quality   Volitional Cough Strong    Volitional Swallow Able to elicit    Anatomy Other (Comment)   evidence of osteophytes and calcification along C-spine   Pharyngeal Secretions Not observed  secondary MBS                Oral Preparation/Oral Phase - 10/08/20 1454       Oral Preparation/Oral Phase   Oral Phase Within functional limits      Electrical stimulation - Oral Phase   Was Electrical Stimulation Used No              Pharyngeal Phase - 10/08/20 1454       Pharyngeal Phase   Pharyngeal Phase Impaired      Pharyngeal - Thin   Pharyngeal- Thin Teaspoon Swallow initiation at vallecula;Reduced epiglottic inversion    Pharyngeal- Thin Cup Swallow initiation at vallecula;Pharyngeal residue - valleculae;Reduced epiglottic inversion;Pharyngeal residue - posterior pharnyx    Pharyngeal- Thin Straw Penetration/Aspiration during swallow;Trace aspiration;Pharyngeal residue - valleculae;Pharyngeal residue - posterior pharnyx;Inter-arytenoid space residue;Reduced epiglottic inversion    Pharyngeal Material enters airway, CONTACTS cords and then ejected out      Pharyngeal - Solids   Pharyngeal- Puree Swallow initiation at vallecula;Reduced epiglottic inversion;Reduced pharyngeal peristalsis;Pharyngeal residue - valleculae;Pharyngeal residue - posterior pharnyx    Pharyngeal- Regular Delayed swallow initiation-vallecula;Reduced pharyngeal peristalsis;Reduced epiglottic inversion;Pharyngeal residue - valleculae;Pharyngeal residue - posterior pharnyx    Pharyngeal- Pill Within functional limits;Reduced epiglottic inversion      Electrical Stimulation - Pharyngeal Phase   Was Electrical Stimulation Used No              Cricopharyngeal Phase - 10/08/20 1458       Cervical Esophageal Phase   Cervical Esophageal Phase Within functional limits            Radiologist noted: Bulky spurs and thick calcification of the anterior longitudinal ligament at the mid to caudal cervical spine, impinging upon posterior hypopharyngeal wall.    Plan - 10/08/20 1500     Clinical Impression Statement Pt presents with mi/mod pharyngeal phase dysphagia in setting of bony  protrusions off the cervical spine impeding pharyngeal space and epiglottic deflection. Oral phase is essentially WNL, Pt with upper dentures. Pt assessed with thin liquids via tsp/cup/straw, puree, regular textures, and a barium tablet presented with cup sips of thin barium. Pt with swallow trigger at the level of the valleculae across all consistencies. Pt with reduced epiglottic deflection with impedence of bone spurs resulting in min vallecular residue and posterior pharyngeal wall residue. Pt with one episode of flash penetration of thins when taking sequential straw sips (residuals from posterior pharyngeal wall spilled to arytenoids), but was immediately expelled. Pt with min vallecular and posterior pharyngeal wall residue with puree and regular textures which clear with repeat/dry swallows. Pt was assessed with head turns to the left and the right when swallowing puree, however results/benefits were inconsistent.  Occasionally, a head turn L/R can be beneficial in Pt's with bony prominence interference. Recommend regular textures (but make sure meats are well masticated) and thin liquids via small, single sips (no sequential cup/straw sips) with Pt to swallow solids "fast and hard" and swallow 2x for each bite/sip to clear pharynx. Pt may benefit from dysphagia therapy for pharyngeal strengthening to see if this helps move the epiglottis past the bony spurs, however, Pt would like to use the above strategies at home first and will contact SLP if he would like to come back in to try therapy. The imaging from the MBSS was reviewed with Pt and questions answered. He was given written information and my contact information should he have further questions.    Treatment/Interventions SLP instruction and feedback;Pharyngeal strengthening exercises;Compensatory techniques;Patient/family education    Potential to Achieve Goals Good    Potential Considerations Ability to learn/carryover information    Consulted  and Agree with Plan of Care Patient             Patient will benefit from skilled therapeutic intervention in order to improve the following deficits and impairments:   Dysphagia, oropharyngeal phase     Recommendations/Treatment - 10/08/20 1458       Swallow Evaluation Recommendations   SLP Diet Recommendations Age appropriate regular;Thin    Liquid Administration via Cup;No straw    Medication Administration Whole meds with liquid    Supervision Patient able to self feed    Compensations Small sips/bites;Multiple dry swallows after each bite/sip;Effortful swallow    Postural Changes Seated upright at 90 degrees;Remain upright for at least 30 minutes after feeds/meals              Prognosis - 10/08/20 1459       Prognosis   Prognosis for Safe Diet Advancement Good      Individuals Consulted   Consulted and Agree with Results and Recommendations Patient    Report Sent to  Referring physician             Problem List Patient Active Problem List   Diagnosis Date Noted   Intermediate stage nonexudative age-related macular degeneration of left eye 01/23/2020   Exudative age-related macular degeneration of right eye with active choroidal neovascularization ([place]) 06/20/2019   Pathologic vitreous membrane, right 06/20/2019   Left epiretinal membrane 06/20/2019   Unstable angina (HCC)    CVA (cerebral infarction) 07/14/2014   Right sided weakness 07/14/2014   Cerebral venous thrombosis of sigmoid sinus    Essential hypertension, benign 01/09/2009   CORONARY ATHEROSCLEROSIS NATIVE CORONARY ARTERY 01/09/2009   DYSLIPIDEMIA 07/17/2008   Thank you,  Genene Churn, Elmer  [place] 10/08/2020, 3:16 PM  [facility] [address] [place], [place], [address] Phone: [phone]   Fax:  [phone]  Name: KOURY ZAMORA MRN: [id number] Date of Birth: 06/20/1940

## 2020-10-17 DIAGNOSIS — I10 Essential (primary) hypertension: Secondary | ICD-10-CM | POA: Diagnosis not present

## 2020-10-17 DIAGNOSIS — R7303 Prediabetes: Secondary | ICD-10-CM | POA: Diagnosis not present

## 2020-10-23 DIAGNOSIS — R7303 Prediabetes: Secondary | ICD-10-CM | POA: Diagnosis not present

## 2020-10-23 DIAGNOSIS — I10 Essential (primary) hypertension: Secondary | ICD-10-CM | POA: Diagnosis not present

## 2020-10-23 DIAGNOSIS — Z0001 Encounter for general adult medical examination with abnormal findings: Secondary | ICD-10-CM | POA: Diagnosis not present

## 2020-10-23 DIAGNOSIS — I2510 Atherosclerotic heart disease of native coronary artery without angina pectoris: Secondary | ICD-10-CM | POA: Diagnosis not present

## 2020-10-23 DIAGNOSIS — H3530 Unspecified macular degeneration: Secondary | ICD-10-CM | POA: Diagnosis not present

## 2020-10-23 DIAGNOSIS — R1310 Dysphagia, unspecified: Secondary | ICD-10-CM | POA: Diagnosis not present

## 2020-10-23 DIAGNOSIS — E782 Mixed hyperlipidemia: Secondary | ICD-10-CM | POA: Diagnosis not present

## 2020-11-21 ENCOUNTER — Other Ambulatory Visit: Payer: Self-pay

## 2020-11-21 ENCOUNTER — Ambulatory Visit (INDEPENDENT_AMBULATORY_CARE_PROVIDER_SITE_OTHER): Payer: PPO | Admitting: Ophthalmology

## 2020-11-21 ENCOUNTER — Encounter (INDEPENDENT_AMBULATORY_CARE_PROVIDER_SITE_OTHER): Payer: Self-pay | Admitting: Ophthalmology

## 2020-11-21 DIAGNOSIS — H353211 Exudative age-related macular degeneration, right eye, with active choroidal neovascularization: Secondary | ICD-10-CM

## 2020-11-21 DIAGNOSIS — H35372 Puckering of macula, left eye: Secondary | ICD-10-CM | POA: Diagnosis not present

## 2020-11-21 MED ORDER — BEVACIZUMAB 2.5 MG/0.1ML IZ SOSY
2.5000 mg | PREFILLED_SYRINGE | INTRAVITREAL | Status: AC | PRN
  Administered 2020-11-21: 2.5 mg via INTRAVITREAL

## 2020-11-21 NOTE — Assessment & Plan Note (Signed)
OS now with decline in acuity and increasing thickness from the epiretinal membrane inducing foveal macular schisis.  We will be recommending in the near future vitrectomy and membrane peel left eye in order to stabilize but more importantly improve acuity

## 2020-11-21 NOTE — Assessment & Plan Note (Signed)
At 9-week examination still with the p.m. bundle CNVM with intraretinal fluid overlying and subretinal fluid suggesting ongoing activity.  We will repeat injection Avastin today and maintain 8 to 9-week follow-up

## 2020-11-21 NOTE — Progress Notes (Signed)
11/21/2020     CHIEF COMPLAINT Patient presents for  Chief Complaint  Patient presents with   Retina Follow Up    9 week fu ou and Avastin OD Pt states VA OU stable since last visit. Pt denies FOL, floaters, or ocular pain OU.        HISTORY OF PRESENT ILLNESS: [name] [name] is a 80 y.o. male who presents to the clinic today for:   HPI     Retina Follow Up   Patient presents with  Wet AMD.  In right eye.  This started 10 weeks ago.  Severity is mild.  Duration of 10 weeks.  Since onset it is stable. Additional comments: 9 week fu ou and Avastin OD Pt states VA OU stable since last visit. Pt denies FOL, floaters, or ocular pain OU.          Comments   10 week fu OD oct avastin OD. Patient states vision is stable and unchanged since last visit. Denies any new floaters or FOL.       Last edited by Laurin Coder on 11/21/2020  9:48 AM.      Referring physician: Celene Squibb, MD [address] [place],  [place] [address]  HISTORICAL INFORMATION:   Selected notes from the MEDICAL RECORD NUMBER    Lab Results  Component Value Date   HGBA1C 6.0 (H) 07/14/2014     CURRENT MEDICATIONS: Current Outpatient Medications (Ophthalmic Drugs)  Medication Sig   Polyethyl Glycol-Propyl Glycol (SYSTANE OP) Place 1 drop into both eyes 3 (three) times daily as needed (Dry Eyes).    PROLENSA 0.07 % SOLN Place 1 drop into the left eye daily.   No current facility-administered medications for this visit. (Ophthalmic Drugs)   Current Outpatient Medications (Other)  Medication Sig   aspirin 81 MG tablet Take 1 tablet (81 mg total) by mouth daily.   Cholecalciferol (VITAMIN D3) 2000 units TABS Take 2,000 Units by mouth daily.   clopidogrel (PLAVIX) 75 MG tablet Take 75 mg by mouth daily.   nitroGLYCERIN (NITROSTAT) 0.4 MG SL tablet Place 1 tablet (0.4 mg total) under the tongue every 5 (five) minutes x 3 doses as needed (if no relief after 3rd dose, proceed to the ED for  an evaluation or call 911).   polycarbophil (FIBERCON) 625 MG tablet Take 1,250 mg by mouth daily.   simvastatin (ZOCOR) 40 MG tablet Take 40 mg by mouth every evening.    telmisartan (MICARDIS) 80 MG tablet Take 80 mg by mouth daily.    UNABLE TO FIND 1 Syringe by Intravitreal route See admin instructions. Patient receives eye injections every 6 to 8 weeks.   No current facility-administered medications for this visit. (Other)      REVIEW OF SYSTEMS:    ALLERGIES Allergies  Allergen Reactions   Penicillins Nausea Only    Has patient had a PCN reaction causing immediate rash, facial/tongue/throat swelling, SOB or lightheadedness with hypotension: No Has patient had a PCN reaction causing severe rash involving mucus membranes or skin necrosis: No Has patient had a PCN reaction that required hospitalization No Has patient had a PCN reaction occurring within the last 10 years: No If all of the above answers are "NO", then may proceed with Cephalosporin use.     PAST MEDICAL HISTORY Past Medical History:  Diagnosis Date   Age-related macular degeneration, dry, right eye    Arthritis    Coronary atherosclerosis of native coronary artery  PTCA/stent to LAD and diagonal 2001; DES to LAD, DES to RCA, DES x 2 to circumflex/OM1 07/2016   CVA (cerebral vascular accident) [facility]) 01/2011   Resolved dysarthria and right hemiparesis   CVA (cerebral vascular accident) ([place]) 06/2014   Drug intolerance    ACE inhibitor; related to cough    Essential hypertension    Hyperlipidemia    MI (myocardial infarction) ([place]) 2000   Partial small bowel obstruction ([place]) 12//2012   Resolved spontaneously.   Refusal of blood transfusions as patient is [name]    Past Surgical History:  Procedure Laterality Date   APPENDECTOMY     CATARACT EXTRACTION W/PHACO Right 05/14/2015   Procedure: CATARACT EXTRACTION PHACO AND INTRAOCULAR LENS PLACEMENT RIGHT EYE CDE=6.96;  Surgeon: Williams Che, MD;  Location: [facility];  Service: Ophthalmology;  Laterality: Right;   CATARACT EXTRACTION W/PHACO Left 05/2019   COLONOSCOPY W/ BIOPSIES AND POLYPECTOMY  2005   CORONARY ANGIOPLASTY WITH STENT PLACEMENT  08/21/2016   CORONARY STENT INTERVENTION N/A 08/21/2016   Procedure: Coronary Stent Intervention;  Surgeon: Burnell Blanks, MD;  Location: [facility];  Service: Cardiovascular;  Laterality: N/A;   LEFT HEART CATH AND CORONARY ANGIOGRAPHY N/A 08/21/2016   Procedure: Left Heart Cath and Coronary Angiography;  Surgeon: Burnell Blanks, MD;  Location: [facility];  Service: Cardiovascular;  Laterality: N/A;   TONSILLECTOMY      FAMILY HISTORY Family History  Problem Relation Age of Onset   Cancer Father    Heart failure Father    Hyperlipidemia Father    Diabetes Sister    Hypertension Sister    Cancer Mother    Cancer Brother    Cancer Sister     SOCIAL HISTORY Social History   Tobacco Use   Smoking status: Former    Packs/day: 1.00    Years: 4.00    Pack years: 4.00    Types: Cigarettes    Quit date: 10/22/1958    Years since quitting: 62.1   Smokeless tobacco: Never  Vaping Use   Vaping Use: Never used  Substance Use Topics   Alcohol use: No    Alcohol/week: 0.0 standard drinks   Drug use: No         OPHTHALMIC EXAM:  Base Eye Exam     Visual Acuity (Snellen - Linear)       Right Left   Dist [place] 20/40 20/40   Dist ph [place] 20/30 -2 NI         Tonometry (Tonopen, 9:51 AM)       Right Left   Pressure 14 12         Pupils       Pupils Dark Light React APD   Right PERRL 2 2 Minimal None   Left PERRL 2 2 Minimal None         Extraocular Movement       Right Left    Full Full         Neuro/Psych     Oriented x3: Yes   Mood/Affect: Normal         Dilation     Right eye: 1.0% Mydriacyl, 2.5% Phenylephrine @ 9:51 AM           Slit Lamp and Fundus Exam     External Exam       Right Left    External Normal Normal         Slit Lamp Exam  Right Left   Lids/Lashes Normal Normal   Conjunctiva/Sclera White and quiet White and quiet   Cornea Clear Clear   Anterior Chamber Deep and quiet Deep and quiet   Iris Round and reactive Round and reactive   Lens Centered posterior chamber intraocular lens Centered posterior chamber intraocular lens   Anterior Vitreous Normal Normal         Fundus Exam       Right Left   Posterior Vitreous Normal    Disc Normal    C/D Ratio 0.0    Macula Macular thickening, nasal to FAZ, subretinal neovascular membrane less active, Hard drusen    Vessels Normal    Periphery Normal             IMAGING AND PROCEDURES  Imaging and Procedures for 11/21/20  OCT, Retina - OU - Both Eyes       Right Eye Quality was good. Scan locations included subfoveal. Central Foveal Thickness: 363. Progression has been stable. Findings include abnormal foveal contour, subretinal fluid, pigment epithelial detachment.   Left Eye Quality was good. Scan locations included subfoveal. Central Foveal Thickness: 479. Progression has been stable. Findings include abnormal foveal contour, epiretinal membrane.   Notes Subretinal fluid overlying pigment epithelial detachment vascularized, nasal to the fovea OD, stable on Avastin currently at 10-week follow-up.   OS with severe, thickening, stable epiretinal membrane, now with foveal macular schisis and a decline in acuity will need surgical attention     Intravitreal Injection, Pharmacologic Agent - OD - Right Eye       Time Out 11/21/2020. 10:30 AM. Confirmed correct patient, procedure, site, and patient consented.   Anesthesia Topical anesthesia was used. Anesthetic medications included Akten 3.5%.   Procedure Preparation included 5% betadine to ocular surface, 10% betadine to eyelids, Tobramycin 0.3%. A 30 gauge needle was used.   Injection: 2.5 mg bevacizumab 2.5 MG/0.1ML   Route:  Intravitreal, Site: Right Eye   NDC: [phone], Lot: [id number]   Post-op Post injection exam found visual acuity of at least counting fingers. The patient tolerated the procedure well. There were no complications. The patient received written and verbal post procedure care education. Post injection medications included ocuflox.              ASSESSMENT/PLAN:  Left epiretinal membrane OS now with decline in acuity and increasing thickness from the epiretinal membrane inducing foveal macular schisis.  We will be recommending in the near future vitrectomy and membrane peel left eye in order to stabilize but more importantly improve acuity  Exudative age-related macular degeneration of right eye with active choroidal neovascularization (HCC) At 9-week examination still with the p.m. bundle CNVM with intraretinal fluid overlying and subretinal fluid suggesting ongoing activity.  We will repeat injection Avastin today and maintain 8 to 9-week follow-up     ICD-10-CM   1. Exudative age-related macular degeneration of right eye with active choroidal neovascularization (HCC)  H35.3211 OCT, Retina - OU - Both Eyes    Intravitreal Injection, Pharmacologic Agent - OD - Right Eye    bevacizumab (AVASTIN) SOSY 2.5 mg    2. Left epiretinal membrane  H35.372       1.  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 bundle OD.  2.  OS now with demonstrable decline in acuity to 20/40 from severe epiretinal membrane which is increasing in thickness and with secondary foveal macular schisis threatening permanent visual  debility.,  Will suggest vitrectomy membrane peel in the left eye in the coming months in order to preserve and potentially improve acuity left eye  3.  Ophthalmic Meds Ordered this visit:  Meds ordered this encounter  Medications   bevacizumab (AVASTIN) SOSY 2.5 mg       Return in about 9 weeks (around 01/23/2021) for  DILATE OU, COLOR FP, AVASTIN OCT, OD.  There are no Patient Instructions on file for this visit.   Explained the diagnoses, plan, and follow up with the patient and they expressed understanding.  Patient expressed understanding of the importance of proper follow up care.   Clent Demark Jaclene Bartelt M.D. Diseases & Surgery of the Retina and Vitreous [facility] [place] 11/21/20     Abbreviations: M myopia (nearsighted); A astigmatism; H hyperopia (farsighted); P presbyopia; Mrx spectacle prescription;  CTL contact lenses; OD right eye; OS left eye; OU both eyes  XT exotropia; ET esotropia; PEK punctate epithelial keratitis; PEE punctate epithelial erosions; DES dry eye syndrome; MGD meibomian gland dysfunction; ATs artificial tears; PFAT's preservative free artificial tears; [place] nuclear sclerotic cataract; PSC posterior subcapsular cataract; ERM epi-retinal membrane; PVD posterior vitreous detachment; RD retinal detachment; DM diabetes mellitus; DR diabetic retinopathy; NPDR non-proliferative diabetic retinopathy; PDR proliferative diabetic retinopathy; CSME clinically significant macular edema; DME diabetic macular edema; dbh dot blot hemorrhages; CWS cotton wool spot; POAG primary open angle glaucoma; C/D cup-to-disc ratio; HVF humphrey visual field; GVF goldmann visual field; OCT optical coherence tomography; IOP intraocular pressure; BRVO Branch retinal vein occlusion; CRVO central retinal vein occlusion; CRAO central retinal artery occlusion; BRAO branch retinal artery occlusion; RT retinal tear; SB scleral buckle; PPV pars plana vitrectomy; VH Vitreous hemorrhage; PRP panretinal laser photocoagulation; IVK intravitreal kenalog; VMT vitreomacular traction; MH Macular hole;  NVD neovascularization of the disc; NVE neovascularization elsewhere; AREDS age related eye disease study; ARMD age related macular degeneration; POAG primary open angle glaucoma; EBMD epithelial/anterior basement membrane  dystrophy; ACIOL anterior chamber intraocular lens; IOL intraocular lens; PCIOL posterior chamber intraocular lens; Phaco/IOL phacoemulsification with intraocular lens placement; [place] photorefractive keratectomy; LASIK laser assisted in situ keratomileusis; HTN hypertension; DM diabetes mellitus; COPD chronic obstructive pulmonary disease

## 2020-11-26 ENCOUNTER — Encounter (INDEPENDENT_AMBULATORY_CARE_PROVIDER_SITE_OTHER): Payer: PPO | Admitting: Ophthalmology

## 2020-12-14 DIAGNOSIS — Z23 Encounter for immunization: Secondary | ICD-10-CM | POA: Diagnosis not present

## 2021-01-21 ENCOUNTER — Encounter (INDEPENDENT_AMBULATORY_CARE_PROVIDER_SITE_OTHER): Payer: Self-pay | Admitting: Ophthalmology

## 2021-01-21 ENCOUNTER — Ambulatory Visit (INDEPENDENT_AMBULATORY_CARE_PROVIDER_SITE_OTHER): Payer: PPO | Admitting: Ophthalmology

## 2021-01-21 ENCOUNTER — Other Ambulatory Visit: Payer: Self-pay

## 2021-01-21 DIAGNOSIS — H353211 Exudative age-related macular degeneration, right eye, with active choroidal neovascularization: Secondary | ICD-10-CM | POA: Diagnosis not present

## 2021-01-21 DIAGNOSIS — H35372 Puckering of macula, left eye: Secondary | ICD-10-CM | POA: Diagnosis not present

## 2021-01-21 MED ORDER — BEVACIZUMAB 2.5 MG/0.1ML IZ SOSY
2.5000 mg | PREFILLED_SYRINGE | INTRAVITREAL | Status: AC | PRN
  Administered 2021-01-21: 12:00:00 2.5 mg via INTRAVITREAL

## 2021-01-21 NOTE — Progress Notes (Signed)
01/21/2021     CHIEF COMPLAINT Patient presents for  Chief Complaint  Patient presents with   Retina Follow Up      HISTORY OF PRESENT ILLNESS: [name] [name] is a 80 y.o. male who presents to the clinic today for:   HPI     Retina Follow Up   Patient presents with  Wet AMD.  In right eye.  This started 9 weeks ago.  Severity is mild.  Duration of 9 weeks.  Since onset it is stable.        Comments   9 week fu ou and oct and fp. Avastin OD. Pt states VA OU stable since last visit. Pt denies FOL, floaters, or ocular pain OU.  Pt states, "I cannot really tell any change in my vision that I know of. If I need that operation on my left eye, I would like to go ahead and get it done for that wrinkle blocking my vision."       Last edited by Kendra Opitz, COA on 01/21/2021 11:08 AM.      Referring physician: Celene Squibb, MD [address] [place],  [place] [address]  HISTORICAL INFORMATION:   Selected notes from the MEDICAL RECORD NUMBER    Lab Results  Component Value Date   HGBA1C 6.0 (H) 07/14/2014     CURRENT MEDICATIONS: Current Outpatient Medications (Ophthalmic Drugs)  Medication Sig   Polyethyl Glycol-Propyl Glycol (SYSTANE OP) Place 1 drop into both eyes 3 (three) times daily as needed (Dry Eyes).    PROLENSA 0.07 % SOLN Place 1 drop into the left eye daily.   No current facility-administered medications for this visit. (Ophthalmic Drugs)   Current Outpatient Medications (Other)  Medication Sig   aspirin 81 MG tablet Take 1 tablet (81 mg total) by mouth daily.   Cholecalciferol (VITAMIN D3) 2000 units TABS Take 2,000 Units by mouth daily.   clopidogrel (PLAVIX) 75 MG tablet Take 75 mg by mouth daily.   nitroGLYCERIN (NITROSTAT) 0.4 MG SL tablet Place 1 tablet (0.4 mg total) under the tongue every 5 (five) minutes x 3 doses as needed (if no relief after 3rd dose, proceed to the ED for an evaluation or call 911).   polycarbophil (FIBERCON) 625 MG  tablet Take 1,250 mg by mouth daily.   simvastatin (ZOCOR) 40 MG tablet Take 40 mg by mouth every evening.    telmisartan (MICARDIS) 80 MG tablet Take 80 mg by mouth daily.    UNABLE TO FIND 1 Syringe by Intravitreal route See admin instructions. Patient receives eye injections every 6 to 8 weeks.   No current facility-administered medications for this visit. (Other)      REVIEW OF SYSTEMS: ROS   Other comments for: Neurological (.) Last edited by Kendra Opitz, COA on 01/21/2021 11:08 AM.       ALLERGIES Allergies  Allergen Reactions   Penicillins Nausea Only    Has patient had a PCN reaction causing immediate rash, facial/tongue/throat swelling, SOB or lightheadedness with hypotension: No Has patient had a PCN reaction causing severe rash involving mucus membranes or skin necrosis: No Has patient had a PCN reaction that required hospitalization No Has patient had a PCN reaction occurring within the last 10 years: No If all of the above answers are "NO", then may proceed with Cephalosporin use.     PAST MEDICAL HISTORY Past Medical History:  Diagnosis Date   Age-related macular degeneration, dry, right eye  Arthritis    Coronary atherosclerosis of native coronary artery    PTCA/stent to LAD and diagonal 2001; DES to LAD, DES to RCA, DES x 2 to circumflex/OM1 07/2016   CVA (cerebral vascular accident) [facility]) 01/2011   Resolved dysarthria and right hemiparesis   CVA (cerebral vascular accident) ([place]) 06/2014   Drug intolerance    ACE inhibitor; related to cough    Essential hypertension    Hyperlipidemia    MI (myocardial infarction) ([place]) 2000   Partial small bowel obstruction ([place]) 12//2012   Resolved spontaneously.   Refusal of blood transfusions as patient is [name]    Past Surgical History:  Procedure Laterality Date   APPENDECTOMY     CATARACT EXTRACTION W/PHACO Right 05/14/2015   Procedure: CATARACT EXTRACTION PHACO AND INTRAOCULAR LENS PLACEMENT  RIGHT EYE CDE=6.96;  Surgeon: Williams Che, MD;  Location: [facility];  Service: Ophthalmology;  Laterality: Right;   CATARACT EXTRACTION W/PHACO Left 05/2019   COLONOSCOPY W/ BIOPSIES AND POLYPECTOMY  2005   CORONARY ANGIOPLASTY WITH STENT PLACEMENT  08/21/2016   CORONARY STENT INTERVENTION N/A 08/21/2016   Procedure: Coronary Stent Intervention;  Surgeon: Burnell Blanks, MD;  Location: [place] [facility];  Service: Cardiovascular;  Laterality: N/A;   LEFT HEART CATH AND CORONARY ANGIOGRAPHY N/A 08/21/2016   Procedure: Left Heart Cath and Coronary Angiography;  Surgeon: Burnell Blanks, MD;  Location: [place] [facility];  Service: Cardiovascular;  Laterality: N/A;   TONSILLECTOMY      FAMILY HISTORY Family History  Problem Relation Age of Onset   Cancer Father    Heart failure Father    Hyperlipidemia Father    Diabetes Sister    Hypertension Sister    Cancer Mother    Cancer Brother    Cancer Sister     SOCIAL HISTORY Social History   Tobacco Use   Smoking status: Former    Packs/day: 1.00    Years: 4.00    Pack years: 4.00    Types: Cigarettes    Quit date: 10/22/1958    Years since quitting: 62.2   Smokeless tobacco: Never  Vaping Use   Vaping Use: Never used  Substance Use Topics   Alcohol use: No    Alcohol/week: 0.0 standard drinks   Drug use: No         OPHTHALMIC EXAM:  Base Eye Exam     Visual Acuity (ETDRS)       Right Left   Dist [place] 20/60 -1 20/40 -1   Dist ph [place] 20/40 NI         Tonometry (Tonopen, 11:13 AM)       Right Left   Pressure 09 10         Pupils       Pupils Dark Light Shape React APD   Right PERRL 2 2 Round Minimal None   Left PERRL 2 2 Round Minimal None         Visual Fields (Counting fingers)       Left Right    Full Full         Extraocular Movement       Right Left    Full Full         Neuro/Psych     Oriented x3: Yes   Mood/Affect: Normal         Dilation     Both eyes:  1.0% Mydriacyl, 2.5% Phenylephrine @ 11:12 AM  Slit Lamp and Fundus Exam     External Exam       Right Left   External Normal Normal         Slit Lamp Exam       Right Left   Lids/Lashes Normal Normal   Conjunctiva/Sclera White and quiet White and quiet   Cornea Clear Clear   Anterior Chamber Deep and quiet Deep and quiet   Iris Round and reactive Round and reactive   Lens Centered posterior chamber intraocular lens Centered posterior chamber intraocular lens   Anterior Vitreous Normal Normal         Fundus Exam       Right Left   Posterior Vitreous Normal Normal   Disc Normal Normal   C/D Ratio 0.0 0.05   Macula Macular thickening, nasal to FAZ, subretinal neovascular membrane less active, Hard drusen Epiretinal membrane, severe topographic distortion OS   Vessels Normal Normal   Periphery Normal Normal            IMAGING AND PROCEDURES  Imaging and Procedures for 01/21/21  OCT, Retina - OU - Both Eyes       Right Eye Quality was good. Scan locations included subfoveal. Central Foveal Thickness: 367. Progression has been stable. Findings include abnormal foveal contour, subretinal fluid, pigment epithelial detachment.   Left Eye Quality was good. Scan locations included subfoveal. Central Foveal Thickness: 482. Progression has been stable. Findings include abnormal foveal contour, epiretinal membrane.   Notes Subretinal fluid overlying pigment epithelial detachment vascularized, nasal to the fovea OD, stable on Avastin currently at 8-week follow-up.   OS with severe, thickening, stable epiretinal membrane, now with foveal macular schisis and a decline in acuity will need surgical attention     Intravitreal Injection, Pharmacologic Agent - OD - Right Eye       Time Out 01/21/2021. 11:47 AM. Confirmed correct patient, procedure, site, and patient consented.   Anesthesia Topical anesthesia was used. Anesthetic medications included  Lidocaine 4%.   Procedure Preparation included 5% betadine to ocular surface, 10% betadine to eyelids, Tobramycin 0.3%. A 30 gauge needle was used.   Injection: 2.5 mg bevacizumab 2.5 MG/0.1ML   Route: Intravitreal, Site: Right Eye   NDC: [phone], Lot: [id number]   Post-op Post injection exam found visual acuity of at least counting fingers. The patient tolerated the procedure well. There were no complications. The patient received written and verbal post procedure care education. Post injection medications included ocuflox.      Color Fundus Photography Optos - OU - Both Eyes       Right Eye Progression has no prior data. Disc findings include normal observations. Macula : drusen. Vessels : normal observations. Periphery : normal observations.   Left Eye Progression has no prior data. Disc findings include normal observations. Macula : epiretinal membrane. Vessels : normal observations. Periphery : normal observations.              ASSESSMENT/PLAN:  Left epiretinal membrane Epiretinal membrane progressively thickening noticeable with decrease in acuity now 20/40.  Will likely need surgical intervention left eye.  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  At 8 weeks and 5 days interval.  We will repeat intravitreal injection today of Avastin     ICD-10-CM   1. 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 Left epiretinal membrane  H35.372       1.  OD, vastly improved and stabilized wet AMD on therapy.  Preserved acuity.  Currently at 8-week  follow-up.  We will repeat injection Avastin today for multiple recurrences of CNVM in the p.m. bundle region.  2.  OS with progressive retinal thickening secondary to epiretinal membrane now with visual acuity decline need to 20/40 will probably need vitrectomy membrane peel discussion upon next visit  3.  Dilate OU next  Ophthalmic Meds Ordered this visit:  Meds ordered this encounter  Medications   bevacizumab (AVASTIN) SOSY 2.5 mg       Return in about 8 weeks (around 03/18/2021) for DILATE OU, AVASTIN OCT, OD, and discuss plan vitrectomy OS.  There are no Patient Instructions on file for this visit.   Explained the diagnoses, plan, and follow up with the patient and they expressed understanding.  Patient expressed understanding of the importance of proper follow up care.   Clent Demark Cerys Winget M.D. Diseases & Surgery of the Retina and Vitreous [facility] [place] 01/21/21     Abbreviations: M myopia (nearsighted); A astigmatism; H hyperopia (farsighted); P presbyopia; Mrx spectacle prescription;  CTL contact lenses; OD right eye; OS left eye; OU both eyes  XT exotropia; ET esotropia; PEK punctate epithelial keratitis; PEE punctate epithelial erosions; DES dry eye syndrome; MGD meibomian gland dysfunction; ATs artificial tears; PFAT's preservative free artificial tears; [place] nuclear sclerotic cataract; PSC posterior subcapsular cataract; ERM epi-retinal membrane; PVD posterior vitreous detachment; RD retinal detachment; DM diabetes mellitus; DR diabetic retinopathy; NPDR non-proliferative diabetic retinopathy; PDR proliferative diabetic retinopathy; CSME clinically significant macular edema; DME diabetic macular edema; dbh dot blot hemorrhages; CWS cotton wool spot; POAG primary open angle glaucoma; C/D cup-to-disc ratio; HVF humphrey visual field; GVF goldmann visual field; OCT optical coherence tomography; IOP intraocular pressure; BRVO Branch retinal vein occlusion; CRVO central  retinal vein occlusion; CRAO central retinal artery occlusion; BRAO branch retinal artery occlusion; RT retinal tear; SB scleral buckle; PPV pars plana vitrectomy; VH Vitreous hemorrhage; PRP panretinal laser photocoagulation; IVK intravitreal kenalog; VMT vitreomacular traction; MH Macular hole;  NVD neovascularization of the disc; NVE neovascularization elsewhere; AREDS age related eye disease study; ARMD age related macular degeneration; POAG primary open angle glaucoma; EBMD epithelial/anterior basement membrane dystrophy; ACIOL anterior chamber intraocular lens; IOL intraocular lens; PCIOL posterior chamber intraocular lens; Phaco/IOL phacoemulsification with intraocular lens placement; [place] photorefractive keratectomy; LASIK laser assisted in situ keratomileusis; HTN hypertension; DM diabetes mellitus; COPD chronic obstructive pulmonary disease

## 2021-01-21 NOTE — Assessment & Plan Note (Signed)
Epiretinal membrane progressively thickening noticeable with decrease in acuity now 20/40.  Will likely need surgical intervention left eye.  We will await for the holiday season And evaluation next visit left eye

## 2021-01-21 NOTE — Assessment & Plan Note (Signed)
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  At 8 weeks and 5 days interval.  We will repeat intravitreal injection today of Avastin

## 2021-03-13 ENCOUNTER — Encounter: Payer: Self-pay | Admitting: Cardiology

## 2021-03-13 ENCOUNTER — Ambulatory Visit: Payer: PPO | Admitting: Cardiology

## 2021-03-13 VITALS — BP 132/70 | HR 63 | Ht 69.0 in | Wt 185.4 lb

## 2021-03-13 DIAGNOSIS — E782 Mixed hyperlipidemia: Secondary | ICD-10-CM

## 2021-03-13 DIAGNOSIS — I25119 Atherosclerotic heart disease of native coronary artery with unspecified angina pectoris: Secondary | ICD-10-CM

## 2021-03-13 NOTE — Progress Notes (Signed)
Cardiology Office Note  Date: 03/13/2021   ID: [name] [name], DOB Aug 05, 1940, MRN [id number]  PCP:  Celene Squibb, MD  Cardiologist:  Rozann Lesches, MD Electrophysiologist:  None   Chief Complaint  Patient presents with   Cardiac follow-up    History of Present Illness: [name] [name] is an 81 y.o. male last seen in July 2022 by Mr. Leonides Sake NP.  He is here for a routine visit.  Describes general lack of energy, but no angina symptoms or nitroglycerin use.  He is functional with basic ADLs.  I did talk with him about considering more regular walking for exercise.  We went over his medications which are noted below.  He has not had any spontaneous bleeding problems on aspirin and Plavix.  He anticipates follow-up physical and lab work with Dr. Nevada Crane in February.  Echocardiogram from June of last year showed LVEF 55 to 60% with mild mitral regurgitation and normal estimated RVSP.  Past Medical History:  Diagnosis Date   Age-related macular degeneration, dry, right eye    Arthritis    Coronary atherosclerosis of native coronary artery    PTCA/stent to LAD and diagonal 2001; DES to LAD, DES to RCA, DES x 2 to circumflex/OM1 07/2016   CVA (cerebral vascular accident) [facility]) 01/2011   Resolved dysarthria and right hemiparesis   CVA (cerebral vascular accident) ([place]) 06/2014   Drug intolerance    ACE inhibitor; related to cough    Essential hypertension    Hyperlipidemia    MI (myocardial infarction) ([place]) 2000   Partial small bowel obstruction ([place]) 12//2012   Resolved spontaneously.   Refusal of blood transfusions as patient is [name]     Past Surgical History:  Procedure Laterality Date   APPENDECTOMY     CATARACT EXTRACTION W/PHACO Right 05/14/2015   Procedure: CATARACT EXTRACTION PHACO AND INTRAOCULAR LENS PLACEMENT RIGHT EYE CDE=6.96;  Surgeon: Williams Che, MD;  Location: [facility];  Service: Ophthalmology;  Laterality: Right;   CATARACT EXTRACTION W/PHACO  Left 05/2019   COLONOSCOPY W/ BIOPSIES AND POLYPECTOMY  2005   CORONARY ANGIOPLASTY WITH STENT PLACEMENT  08/21/2016   CORONARY STENT INTERVENTION N/A 08/21/2016   Procedure: Coronary Stent Intervention;  Surgeon: Burnell Blanks, MD;  Location: [place] [facility];  Service: Cardiovascular;  Laterality: N/A;   LEFT HEART CATH AND CORONARY ANGIOGRAPHY N/A 08/21/2016   Procedure: Left Heart Cath and Coronary Angiography;  Surgeon: Burnell Blanks, MD;  Location: [place] [facility];  Service: Cardiovascular;  Laterality: N/A;   TONSILLECTOMY      Current Outpatient Medications  Medication Sig Dispense Refill   aspirin 81 MG tablet Take 1 tablet (81 mg total) by mouth daily. 30 tablet 11   atorvastatin (LIPITOR) 40 MG tablet Take 40 mg by mouth daily.     Cholecalciferol (VITAMIN D3) 2000 units TABS Take 2,000 Units by mouth daily.     clopidogrel (PLAVIX) 75 MG tablet Take 75 mg by mouth daily.     nitroGLYCERIN (NITROSTAT) 0.4 MG SL tablet Place 1 tablet (0.4 mg total) under the tongue every 5 (five) minutes x 3 doses as needed (if no relief after 3rd dose, proceed to the ED for an evaluation or call 911). 25 tablet 3   polycarbophil (FIBERCON) 625 MG tablet Take 1,250 mg by mouth daily.     Polyethyl Glycol-Propyl Glycol (SYSTANE OP) Place 1 drop into both eyes 3 (three) times daily as needed (Dry Eyes).  PROLENSA 0.07 % SOLN Place 1 drop into the left eye daily.     telmisartan (MICARDIS) 80 MG tablet Take 80 mg by mouth daily.      UNABLE TO FIND 1 Syringe by Intravitreal route See admin instructions. Patient receives eye injections every 6 to 8 weeks.     No current facility-administered medications for this visit.   Allergies:  Penicillins   ROS: No palpitations or syncope.  Physical Exam: VS:  BP 132/70    Pulse 63    Ht 5\' 9"  (1.753 m)    Wt 185 lb 6.4 oz (84.1 kg)    SpO2 98%    BMI 27.38 kg/m , BMI Body mass index is 27.38 kg/m.  Wt Readings from Last 3  Encounters:  03/13/21 185 lb 6.4 oz (84.1 kg)  08/28/20 191 lb (86.6 kg)  05/28/20 196 lb 6.4 oz (89.1 kg)    General: Patient appears comfortable at rest. HEENT: Conjunctiva and lids normal, wearing a mask. Neck: Supple, no elevated JVP or carotid bruits, no thyromegaly. Lungs: Clear to auscultation, nonlabored breathing at rest. Cardiac: Regular rate and rhythm, no S3 or significant systolic murmur, no pericardial rub. Extremities: No pitting edema.  ECG:  An ECG dated 08/28/2020 was personally reviewed today and demonstrated:  Sinus rhythm with decreased R wave progression.  Recent Labwork:  February 2022: Hemoglobin 14.4, platelets 222, BUN 15, creatinine 0.88, potassium 4.4, AST 13, ALT 14, cholesterol 137, triglycerides 102, HDL 37, LDL 81, hemoglobin A1c 6.1%  Other Studies Reviewed Today:  Echocardiogram 07/30/2020:  1. Left ventricular ejection fraction, by estimation, is 55 to 60%. The  left ventricle has normal function. The left ventricle demonstrates  regional wall motion abnormalities (see scoring diagram/findings for  description). Left ventricular diastolic  parameters are indeterminate. Normal global longitudinal strain of -18.8%.   2. Right ventricular systolic function is normal. The right ventricular  size is normal. There is normal pulmonary artery systolic pressure. The  estimated right ventricular systolic pressure is 30.0 mmHg.   3. The mitral valve is grossly normal. Mild mitral valve regurgitation.   4. The aortic valve is tricuspid. Aortic valve regurgitation is not  visualized.   5. The inferior vena cava is normal in size with greater than 50%  respiratory variability, suggesting right atrial pressure of 3 mmHg.   Assessment and Plan:  1.  CAD status post DES to the LAD, RCA, and circumflex OM territory as of 2018.  He has not had any angina symptoms nitroglycerin use, LVEF 55 to 60% by follow-up echocardiogram last year.  We will continue observation for  now.  He remains on long-term dual antiplatelet regimen along with Lipitor and Micardis.  2.  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  Last LDL 81.  He will have follow-up lab work with Dr. Nevada Crane in February.  Medication Adjustments/Labs and Tests Ordered: Current medicines are reviewed at length with the patient today.  Concerns regarding medicines are outlined above.   Tests Ordered: No orders of the defined types were placed in this encounter.   Medication Changes: No orders of the defined types were placed in this encounter.   Disposition:  Follow up  6 months.  Signed, Satira Sark, MD, [facility] 03/13/2021 1:13 PM    [place] [facility] [place], [place], [place] [address] Phone: [phone]; Fax: [phone]

## 2021-03-13 NOTE — Patient Instructions (Addendum)

## 2021-03-18 ENCOUNTER — Other Ambulatory Visit: Payer: Self-pay

## 2021-03-18 ENCOUNTER — Encounter (INDEPENDENT_AMBULATORY_CARE_PROVIDER_SITE_OTHER): Payer: Self-pay | Admitting: Ophthalmology

## 2021-03-18 ENCOUNTER — Ambulatory Visit (INDEPENDENT_AMBULATORY_CARE_PROVIDER_SITE_OTHER): Payer: PPO | Admitting: Ophthalmology

## 2021-03-18 DIAGNOSIS — H353211 Exudative age-related macular degeneration, right eye, with active choroidal neovascularization: Secondary | ICD-10-CM

## 2021-03-18 DIAGNOSIS — H35372 Puckering of macula, left eye: Secondary | ICD-10-CM

## 2021-03-18 MED ORDER — BEVACIZUMAB 2.5 MG/0.1ML IZ SOSY
2.5000 mg | PREFILLED_SYRINGE | INTRAVITREAL | Status: AC | PRN
  Administered 2021-03-18: 2.5 mg via INTRAVITREAL

## 2021-03-18 NOTE — Assessment & Plan Note (Signed)
Preserved acuity, peripapillary CNVM much less activity. Patient currently at 8-week follow-up interval

## 2021-03-18 NOTE — Progress Notes (Signed)
03/18/2021     CHIEF COMPLAINT Patient presents for  Chief Complaint  Patient presents with   Retina Evaluation   Macular Degeneration      HISTORY OF PRESENT ILLNESS: [name] [name] is a 81 y.o. 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    OS with epiretinal membrane, now worsening of vision.  Affecting patient's quality of life.  Would like to discuss vitrectomy membrane peel left eye Last edited by Hurman Horn, MD on 03/18/2021 12:25 PM.      Referring physician: Celene Squibb, MD [place],  [place] [address]  HISTORICAL INFORMATION:   Selected notes from the MEDICAL RECORD NUMBER    Lab Results  Component Value Date   HGBA1C 6.0 (H) 07/14/2014     CURRENT MEDICATIONS: Current Outpatient Medications (Ophthalmic Drugs)  Medication Sig   Polyethyl Glycol-Propyl Glycol (SYSTANE OP) Place 1 drop into both eyes 3 (three) times daily as needed (Dry Eyes).    PROLENSA 0.07 % SOLN Place 1 drop into the left eye daily.   No current facility-administered medications for this visit. (Ophthalmic Drugs)   Current Outpatient Medications (Other)  Medication Sig   aspirin 81 MG tablet Take 1 tablet (81 mg total) by mouth daily.   atorvastatin (LIPITOR) 40 MG tablet Take 40 mg by mouth daily.   Cholecalciferol (VITAMIN D3) 2000 units TABS Take 2,000 Units by mouth daily.   clopidogrel (PLAVIX) 75 MG tablet Take 75 mg by mouth daily.   nitroGLYCERIN (NITROSTAT) 0.4 MG SL tablet Place 1 tablet (0.4 mg total) under the tongue every 5 (five) minutes x 3 doses as needed (if no relief after 3rd dose, proceed to the ED for an evaluation or call 911).   polycarbophil (FIBERCON) 625 MG tablet Take 1,250 mg by mouth daily.   telmisartan (MICARDIS) 80 MG tablet Take 80 mg by mouth daily.    UNABLE TO FIND 1 Syringe by Intravitreal route See admin  instructions. Patient receives eye injections every 6 to 8 weeks.   No current facility-administered medications for this visit. (Other)      REVIEW OF SYSTEMS: ROS   Negative for: Constitutional, Gastrointestinal, Neurological, Skin, Genitourinary, Musculoskeletal, HENT, Endocrine, Cardiovascular, Eyes, Respiratory, Psychiatric, Allergic/Imm, Heme/Lymph Last edited by Hurman Horn, MD on 03/18/2021 12:25 PM.       ALLERGIES Allergies  Allergen Reactions   Penicillins Nausea Only    Has patient had a PCN reaction causing immediate rash, facial/tongue/throat swelling, SOB or lightheadedness with hypotension: No Has patient had a PCN reaction causing severe rash involving mucus membranes or skin necrosis: No Has patient had a PCN reaction that required hospitalization No Has patient had a PCN reaction occurring within the last 10 years: No If all of the above answers are "NO", then may proceed with Cephalosporin use.     PAST MEDICAL HISTORY Past Medical History:  Diagnosis Date   Age-related macular degeneration, dry, right eye    Arthritis    Coronary atherosclerosis of native coronary artery    PTCA/stent to LAD and diagonal 2001; DES to LAD, DES to RCA, DES x 2 to circumflex/OM1 07/2016   [facility]) [facility]   Resolved dysarthria and right hemiparesis   CVA (cerebral vascular accident) ([place]) 06/2014   Drug intolerance    ACE  inhibitor; related to cough    Essential hypertension    Hyperlipidemia    MI (myocardial infarction) ([place]) 2000   Partial small bowel obstruction ([place]) 12//2012   Resolved spontaneously.   Refusal of blood transfusions as patient is [name]    Past Surgical History:  Procedure Laterality Date   APPENDECTOMY     CATARACT EXTRACTION W/PHACO Right 05/14/2015   Procedure: CATARACT EXTRACTION PHACO AND INTRAOCULAR LENS PLACEMENT RIGHT EYE CDE=6.96;  Surgeon: Williams Che, MD;  Location: [facility];  Service:  Ophthalmology;  Laterality: Right;   CATARACT EXTRACTION W/PHACO Left 05/2019   COLONOSCOPY W/ BIOPSIES AND POLYPECTOMY  2005   CORONARY ANGIOPLASTY WITH STENT PLACEMENT  08/21/2016   CORONARY STENT INTERVENTION N/A 08/21/2016   Procedure: Coronary Stent Intervention;  Surgeon: Burnell Blanks, MD;  Location: [facility];  Service: Cardiovascular;  Laterality: N/A;   LEFT HEART CATH AND CORONARY ANGIOGRAPHY N/A 08/21/2016   Procedure: Left Heart Cath and Coronary Angiography;  Surgeon: Burnell Blanks, MD;  Location: [facility] [facility];  Service: Cardiovascular;  Laterality: N/A;   TONSILLECTOMY      FAMILY HISTORY Family History  Problem Relation Age of Onset   Cancer Father    Heart failure Father    Hyperlipidemia Father    Diabetes Sister    Hypertension Sister    Cancer Mother    Cancer Brother    Cancer Sister     SOCIAL HISTORY Social History   Tobacco Use   Smoking status: Former    Packs/day: 1.00    Years: 4.00    Pack years: 4.00    Types: Cigarettes    Quit date: 10/22/1958    Years since quitting: 62.4   Smokeless tobacco: Never  Vaping Use   Vaping Use: Never used  Substance Use Topics   Alcohol use: No    Alcohol/week: 0.0 standard drinks   Drug use: No         OPHTHALMIC EXAM:  Base Eye Exam     Visual Acuity (ETDRS)       Right Left   Dist [place] 20/60 20/60   Dist ph [place] NI 20/40         Pupils       Pupils APD   Right PERRL None   Left PERRL None         Visual Fields       Left Right    Full Full         Neuro/Psych     Oriented x3: Yes   Mood/Affect: Normal         Dilation     Both eyes: 1.0% Mydriacyl, 2.5% Phenylephrine @ 11:47 AM           Slit Lamp and Fundus Exam     External Exam       Right Left   External Normal Normal         Slit Lamp Exam       Right Left   Lids/Lashes Normal Normal   Conjunctiva/Sclera White and quiet White and quiet   Cornea Clear Clear    Anterior Chamber Deep and quiet Deep and quiet   Iris Round and reactive Round and reactive   Lens Centered posterior chamber intraocular lens Centered posterior chamber intraocular lens   Anterior Vitreous Normal Normal         Fundus Exam       Right Left   Posterior Vitreous  Normal Normal   Disc Normal Normal   C/D Ratio 0.0 0.05   Macula Macular thickening, nasal to FAZ, subretinal neovascular membrane less active, Hard drusen Epiretinal membrane, severe topographic distortion OS   Vessels Normal Normal   Periphery Normal Normal            IMAGING AND PROCEDURES  Imaging and Procedures for 03/18/21  OCT, Retina - OU - Both Eyes       Right Eye Quality was good. Scan locations included subfoveal. Central Foveal Thickness: 358. Progression has been stable. Findings include abnormal foveal contour, subretinal fluid, pigment epithelial detachment.   Left Eye Quality was good. Scan locations included subfoveal. Central Foveal Thickness: 482. Progression has been stable. Findings include abnormal foveal contour, epiretinal membrane.   Notes Subretinal fluid overlying pigment epithelial detachment vascularized, nasal to the fovea OD, stable on Avastin currently at 8-week follow-up.   OS with severe, thickening, stable epiretinal membrane, now with foveal macular schisis and a decline in acuity will need surgical attention     Intravitreal Injection, Pharmacologic Agent - OD - Right Eye       Time Out 03/18/2021. 12:25 PM. Confirmed correct patient, procedure, site, and patient consented.   Anesthesia Topical anesthesia was used. Anesthetic medications included Lidocaine 4%.   Procedure Preparation included 5% betadine to ocular surface, 10% betadine to eyelids, Tobramycin 0.3%. A 30 gauge needle was used.   Injection: 2.5 mg bevacizumab 2.5 MG/0.1ML   Route: Intravitreal, Site: Right Eye   NDC: [phone], Lot: [id number]   Post-op Post injection exam found  visual acuity of at least counting fingers. The patient tolerated the procedure well. There were no complications. The patient received written and verbal post procedure care education. Post injection medications included ocuflox.              ASSESSMENT/PLAN:  Exudative age-related macular degeneration of right eye with active choroidal neovascularization (HCC) Preserved acuity, peripapillary CNVM much less activity. Patient currently at 8-week follow-up interval  Left epiretinal membrane OS with intact visual acuity from epiretinal membrane, severe macular thickening.  Will need vitrectomy membrane peel left eye as the only way is to improve chance for visual acuity.  Patient has a 95% chance of visual acuity improvement which will develop over the weeks following after removal of epiretinal membrane via vitrectomy membrane peel       ICD-10-CM   1. Exudative age-related macular degeneration of right eye with active choroidal neovascularization (HCC)  H35.3211 OCT, Retina - OU - Both Eyes    Intravitreal Injection, Pharmacologic Agent - OD - Right Eye    bevacizumab (AVASTIN) SOSY 2.5 mg    2. Left epiretinal membrane  H35.372 OCT, Retina - OU - Both Eyes      1.  OD with active peripapillary CNVM stable at 8 weeks repeat injection of intravitreal Avastin OD today to maintain  2.  OS with impact of visual acuity secondary to epiretinal and progression.  Will need vitrectomy membrane peel and attempt to allow for visual acuity improvement in maximal visual acuity recovery OS  3.  Ophthalmic Meds Ordered this visit:  Meds ordered this encounter  Medications   bevacizumab (AVASTIN) SOSY 2.5 mg       Return in about 8 weeks (around 05/13/2021), or ,, [place], for dilate, OD, AVASTIN OCT,,, schedule vitrectomy membrane [id number], OS.  There are no Patient Instructions on file for this visit.   Explained the diagnoses, plan, and follow up  with the  patient and they expressed understanding.  Patient expressed understanding of the importance of proper follow up care.   Clent Demark Joncarlo Friberg M.D. Diseases & Surgery of the Retina and Vitreous [facility] 03/18/21     Abbreviations: M myopia (nearsighted); A astigmatism; H hyperopia (farsighted); P presbyopia; Mrx spectacle prescription;  CTL contact lenses; OD right eye; OS left eye; OU both eyes  XT exotropia; ET esotropia; PEK punctate epithelial keratitis; PEE punctate epithelial erosions; DES dry eye syndrome; MGD meibomian gland dysfunction; ATs artificial tears; PFAT's preservative free artificial tears; [place] nuclear sclerotic cataract; PSC posterior subcapsular cataract; ERM epi-retinal membrane; PVD posterior vitreous detachment; RD retinal detachment; DM diabetes mellitus; DR diabetic retinopathy; NPDR non-proliferative diabetic retinopathy; PDR proliferative diabetic retinopathy; CSME clinically significant macular edema; DME diabetic macular edema; dbh dot blot hemorrhages; CWS cotton wool spot; POAG primary open angle glaucoma; C/D cup-to-disc ratio; HVF humphrey visual field; GVF goldmann visual field; OCT optical coherence tomography; IOP intraocular pressure; BRVO Branch retinal vein occlusion; CRVO central retinal vein occlusion; CRAO central retinal artery occlusion; BRAO branch retinal artery occlusion; RT retinal tear; SB scleral buckle; PPV pars plana vitrectomy; VH Vitreous hemorrhage; PRP panretinal laser photocoagulation; IVK intravitreal kenalog; VMT vitreomacular traction; MH Macular hole;  NVD neovascularization of the disc; NVE neovascularization elsewhere; AREDS age related eye disease study; ARMD age related macular degeneration; POAG primary open angle glaucoma; EBMD epithelial/anterior basement membrane dystrophy; ACIOL anterior chamber intraocular lens; IOL intraocular lens; PCIOL posterior chamber intraocular lens; Phaco/IOL phacoemulsification with intraocular  lens placement; [place] photorefractive keratectomy; LASIK laser assisted in situ keratomileusis; HTN hypertension; DM diabetes mellitus; COPD chronic obstructive pulmonary disease

## 2021-03-18 NOTE — Assessment & Plan Note (Signed)
OS with intact visual acuity from epiretinal membrane, severe macular thickening.  Will need vitrectomy membrane peel left eye as the only way is to improve chance for visual acuity.  Patient has a 95% chance of visual acuity improvement which will develop over the weeks following after removal of epiretinal membrane via vitrectomy membrane peel

## 2021-04-03 ENCOUNTER — Encounter (INDEPENDENT_AMBULATORY_CARE_PROVIDER_SITE_OTHER): Payer: Self-pay

## 2021-04-03 ENCOUNTER — Other Ambulatory Visit: Payer: Self-pay

## 2021-04-03 ENCOUNTER — Ambulatory Visit (INDEPENDENT_AMBULATORY_CARE_PROVIDER_SITE_OTHER): Payer: PPO

## 2021-04-03 DIAGNOSIS — H35372 Puckering of macula, left eye: Secondary | ICD-10-CM

## 2021-04-03 MED ORDER — OFLOXACIN 0.3 % OP SOLN: 1.0000 [drp] | Freq: Four times a day (QID) | OPHTHALMIC | 0 refills | Status: AC

## 2021-04-03 MED ORDER — PREDNISOLONE ACETATE 1 % OP SUSP: 1.0000 [drp] | Freq: Four times a day (QID) | OPHTHALMIC | 0 refills | Status: AC

## 2021-04-03 NOTE — Progress Notes (Signed)
04/03/2021     CHIEF COMPLAINT Patient presents for Pre-op Exam   HISTORY OF PRESENT ILLNESS: [name] [name] is a 81 y.o. male who presents to the clinic today for:   HPI   Pt will have PPV/ Membrane Peel OS on 04/10/2021  Pt states VA OU stable since last visit. Pt denies FOL, floaters, or ocular pain OU.   Pt reports, "everything is still the same."   Last edited by Kendra Opitz, COA on 04/03/2021  1:11 PM.        HISTORICAL INFORMATION:   Selected notes from the [facility]    Lab Results  Component Value Date   HGBA1C 6.0 (H) 07/14/2014     CURRENT MEDICATIONS: Current Outpatient Medications (Ophthalmic Drugs)  Medication Sig   Polyethyl Glycol-Propyl Glycol (SYSTANE OP) Place 1 drop into both eyes 3 (three) times daily as needed (Dry Eyes).    PROLENSA 0.07 % SOLN Place 1 drop into the left eye daily.   No current facility-administered medications for this visit. (Ophthalmic Drugs)   Current Outpatient Medications (Other)  Medication Sig   aspirin 81 MG tablet Take 1 tablet (81 mg total) by mouth daily.   atorvastatin (LIPITOR) 40 MG tablet Take 40 mg by mouth daily.   Cholecalciferol (VITAMIN D3) 2000 units TABS Take 2,000 Units by mouth daily.   clopidogrel (PLAVIX) 75 MG tablet Take 75 mg by mouth daily.   nitroGLYCERIN (NITROSTAT) 0.4 MG SL tablet Place 1 tablet (0.4 mg total) under the tongue every 5 (five) minutes x 3 doses as needed (if no relief after 3rd dose, proceed to the ED for an evaluation or call 911).   polycarbophil (FIBERCON) 625 MG tablet Take 1,250 mg by mouth daily.   telmisartan (MICARDIS) 80 MG tablet Take 80 mg by mouth daily.    UNABLE TO FIND 1 Syringe by Intravitreal route See admin instructions. Patient receives eye injections every 6 to 8 weeks.   No current facility-administered medications for this visit. (Other)     ALLERGIES Allergies  Allergen Reactions   Penicillins Nausea Only    Has patient had a PCN  reaction causing immediate rash, facial/tongue/throat swelling, SOB or lightheadedness with hypotension: No Has patient had a PCN reaction causing severe rash involving mucus membranes or skin necrosis: No Has patient had a PCN reaction that required hospitalization No Has patient had a PCN reaction occurring within the last 10 years: No If all of the above answers are "NO", then may proceed with Cephalosporin use.     PAST MEDICAL HISTORY Past Medical History:  Diagnosis Date   Age-related macular degeneration, dry, right eye    Arthritis    Coronary atherosclerosis of native coronary artery    PTCA/stent to LAD and diagonal 2001; DES to LAD, DES to RCA, DES x 2 to circumflex/OM1 07/2016   CVA (cerebral vascular accident) [facility]) 01/2011   Resolved dysarthria and right hemiparesis   CVA (cerebral vascular accident) ([place]) 06/2014   Drug intolerance    ACE inhibitor; related to cough    Essential hypertension    Hyperlipidemia    MI (myocardial infarction) ([place]) 2000   Partial small bowel obstruction ([place]) 12//2012   Resolved spontaneously.   Refusal of blood transfusions as patient is [name]    Past Surgical History:  Procedure Laterality Date   APPENDECTOMY     CATARACT EXTRACTION W/PHACO Right 05/14/2015   Procedure: CATARACT EXTRACTION PHACO AND INTRAOCULAR LENS PLACEMENT RIGHT EYE  CDE=6.96;  Surgeon: Williams Che, MD;  Location: [facility];  Service: Ophthalmology;  Laterality: Right;   CATARACT EXTRACTION W/PHACO Left 05/2019   COLONOSCOPY W/ BIOPSIES AND POLYPECTOMY  2005   CORONARY ANGIOPLASTY WITH STENT PLACEMENT  08/21/2016   CORONARY STENT INTERVENTION N/A 08/21/2016   Procedure: Coronary Stent Intervention;  Surgeon: Burnell Blanks, MD;  Location: [place] [facility];  Service: Cardiovascular;  Laterality: N/A;   LEFT HEART CATH AND CORONARY ANGIOGRAPHY N/A 08/21/2016   Procedure: Left Heart Cath and Coronary Angiography;  Surgeon: Burnell Blanks, MD;  Location: [place] [facility];  Service: Cardiovascular;  Laterality: N/A;   TONSILLECTOMY      FAMILY HISTORY Family History  Problem Relation Age of Onset   Cancer Father    Heart failure Father    Hyperlipidemia Father    Diabetes Sister    Hypertension Sister    Cancer Mother    Cancer Brother    Cancer Sister     SOCIAL HISTORY Social History   Tobacco Use   Smoking status: Former    Packs/day: 1.00    Years: 4.00    Pack years: 4.00    Types: Cigarettes    Quit date: 10/22/1958    Years since quitting: 62.4   Smokeless tobacco: Never  Vaping Use   Vaping Use: Never used  Substance Use Topics   Alcohol use: No    Alcohol/week: 0.0 standard drinks   Drug use: No         OPHTHALMIC EXAM:  Base Eye Exam     Visual Acuity (ETDRS)       Right Left   Dist [place] 20/60 +2 20/50   Dist ph [place] NI 20/40         Tonometry (Tonopen, 1:13 PM)       Right Left   Pressure 12 13         Pupils       Pupils Dark Light Shape React APD   Right PERRL 2 2 Round Minimal None   Left PERRL 2 2 Round Minimal None         Visual Fields (Counting fingers)       Left Right    Full Full         Extraocular Movement       Right Left    Full, Ortho Full, Ortho         Neuro/Psych     Oriented x3: Yes         Dilation     Both eyes: No Dilation @ 1:14 PM            IMAGING AND PROCEDURES  Imaging and Procedures for @TODAY @           ASSESSMENT/PLAN:  No diagnosis found.  Ophthalmic Meds Ordered this visit:  No orders of the defined types were placed in this encounter.       Pre-op completed. Operative consent obtained with pre-op eye drops reviewed with [name] and sent via [facility] as needed. Post op instructions reviewed with patient and per patient all questions answered.  [place], COA

## 2021-04-04 ENCOUNTER — Telehealth: Payer: Self-pay | Admitting: Cardiology

## 2021-04-04 NOTE — Telephone Encounter (Signed)
Callback pool, please request more info from the surgeon's office as regard to the type of eye surgery.  Also check with Dr. Dahlia Bailiff office to see if aspirin and the Plavix will need to be held from their perspective?

## 2021-04-04 NOTE — Telephone Encounter (Signed)
° °  Pre-operative Risk Assessment    Patient Name: [name] [name]  DOB: Jan 16, 1941 MRN: [id number]      Request for Surgical Clearance    Procedure:  Eye Surgery (Patient did not specify)  Date of Surgery:  Clearance 04/10/21                                 Surgeon:  Dr. Deloria Lair Surgeon's Group or Practice Name:   [facility] Phone number:  [phone] Fax number:     Type of Clearance Requested:   - Medical  - Pharmacy:  Hold Aspirin and Clopidogrel (Plavix) 3 days    Type of Anesthesia:  Patient did not know  6. Are there any other requests or questions from the surgeon?    :1}  Additional requests/questions:    Signed, Johnna Acosta   04/04/2021, 1:02 PM

## 2021-04-04 NOTE — Telephone Encounter (Signed)
Dr. Domenic Polite to review.  Patient previously underwent multiple DES placement in 2018.  Patient was seen less than a month ago at which time she was doing well without any issue.  Surgeon's office request to hold aspirin and Plavix for 3 days, Dr. Domenic Polite, would you be okay with this?  Please forward your response to P CV DIV PREOP

## 2021-04-04 NOTE — Telephone Encounter (Signed)
I called and s/w Dr. Dahlia Bailiff office for clarification on procedure.  PROCEDURE: VITRECTOMY  ANESTHESIA: LOCAL AND MAC  FAX# [phone]

## 2021-04-05 ENCOUNTER — Encounter (INDEPENDENT_AMBULATORY_CARE_PROVIDER_SITE_OTHER): Payer: Self-pay

## 2021-04-05 NOTE — Telephone Encounter (Signed)
Please forward to the requesting provider's office.  No fax number is available.

## 2021-04-05 NOTE — Telephone Encounter (Signed)
° °  Name: [name] [name]  DOB: [birth year]  MRN: [id number]   Primary Cardiologist: Rozann Lesches, MD  Chart reviewed as part of pre-operative protocol coverage. Patient was contacted 04/05/2021 in reference to pre-operative risk assessment for pending surgery as outlined below.  [name] [name] was last seen on 03/13/2021 by Dr. Domenic Polite.  Since that day, [name] [name] has done well without exertional chest pain or worsening dyspnea.  Therefore, based on ACC/AHA guidelines, the patient would be at acceptable risk for the planned procedure without further cardiovascular testing.   Per Dr. Domenic Polite, okay to hold aspirin and Plavix as requested prior to the procedure and restart as soon as possible afterward at the surgeon's discretion.  The patient was advised that if he develops new symptoms prior to surgery to contact our office to arrange for a follow-up visit, and he verbalized understanding.  I will route this recommendation to the requesting party via Epic fax function and remove from pre-op pool. Please call with questions.  [place], [place] 04/05/2021, 11:41 AM

## 2021-04-05 NOTE — Telephone Encounter (Signed)
I have faxed clearance note and ov note to FAX# [phone]

## 2021-04-10 ENCOUNTER — Encounter (AMBULATORY_SURGERY_CENTER): Payer: PPO | Admitting: Ophthalmology

## 2021-04-10 DIAGNOSIS — H35372 Puckering of macula, left eye: Secondary | ICD-10-CM | POA: Diagnosis not present

## 2021-04-11 ENCOUNTER — Encounter (INDEPENDENT_AMBULATORY_CARE_PROVIDER_SITE_OTHER): Payer: Self-pay | Admitting: Ophthalmology

## 2021-04-11 ENCOUNTER — Ambulatory Visit (INDEPENDENT_AMBULATORY_CARE_PROVIDER_SITE_OTHER): Payer: PPO | Admitting: Ophthalmology

## 2021-04-11 ENCOUNTER — Other Ambulatory Visit: Payer: Self-pay

## 2021-04-11 DIAGNOSIS — H35372 Puckering of macula, left eye: Secondary | ICD-10-CM

## 2021-04-11 DIAGNOSIS — H353211 Exudative age-related macular degeneration, right eye, with active choroidal neovascularization: Secondary | ICD-10-CM

## 2021-04-11 NOTE — Assessment & Plan Note (Addendum)
Postop day #1 status post vitrectomy membrane peel left eye, no gas injection required.  Patient will commence topical medications today  Patient restarted Plavix and aspirin this morning

## 2021-04-11 NOTE — Assessment & Plan Note (Signed)
OD, stable follow-up as scheduled

## 2021-04-11 NOTE — Patient Instructions (Signed)

## 2021-04-11 NOTE — Progress Notes (Signed)
04/11/2021     CHIEF COMPLAINT Patient presents for  Chief Complaint  Patient presents with   Post-op Follow-up      HISTORY OF PRESENT ILLNESS: [name] [name] is a 81 y.o. male who presents to the clinic today for: Postop day #1 vitrectomy membrane peel left eye for severe epiretinal membrane, LMAC at [facility]  HPI     Post-op Follow-up           Laterality: left eye   Discomfort: pain, itching, foreign body sensation and tearing   Vision: is worse         Comments   1DPO OS sx 04/10/2021.  Post vit ILM peel OS  Pt states "I felt like there was a piece of barbed wire in my eye, I was in pain so I took a couple tylenol and it eased off. It has been itchy. My vision is blurry in the left eye." Pt has prednisolone and ofloxacin and is aware to start the drops again in the left eye QID .      Last edited by Hurman Horn, MD on 04/11/2021 10:41 AM.      Referring physician: Celene Squibb, MD [id number] [place],  [place] [id number]  HISTORICAL INFORMATION:   Selected notes from the MEDICAL RECORD NUMBER    Lab Results  Component Value Date   HGBA1C 6.0 (H) 07/14/2014     CURRENT MEDICATIONS: Current Outpatient Medications (Ophthalmic Drugs)  Medication Sig   Polyethyl Glycol-Propyl Glycol (SYSTANE OP) Place 1 drop into both eyes 3 (three) times daily as needed (Dry Eyes).    PROLENSA 0.07 % SOLN Place 1 drop into the left eye daily.   No current facility-administered medications for this visit. (Ophthalmic Drugs)   Current Outpatient Medications (Other)  Medication Sig   aspirin 81 MG tablet Take 1 tablet (81 mg total) by mouth daily.   atorvastatin (LIPITOR) 40 MG tablet Take 40 mg by mouth daily.   Cholecalciferol (VITAMIN D3) 2000 units TABS Take 2,000 Units by mouth daily.   clopidogrel (PLAVIX) 75 MG tablet Take 75 mg by mouth daily.   nitroGLYCERIN (NITROSTAT) 0.4 MG SL tablet Place 1 tablet (0.4 mg total) under the tongue every 5  (five) minutes x 3 doses as needed (if no relief after 3rd dose, proceed to the ED for an evaluation or call 911).   polycarbophil (FIBERCON) 625 MG tablet Take 1,250 mg by mouth daily.   telmisartan (MICARDIS) 80 MG tablet Take 80 mg by mouth daily.    UNABLE TO FIND 1 Syringe by Intravitreal route See admin instructions. Patient receives eye injections every 6 to 8 weeks.   No current facility-administered medications for this visit. (Other)      REVIEW OF SYSTEMS: ROS   Negative for: Constitutional, Gastrointestinal, Neurological, Skin, Genitourinary, Musculoskeletal, HENT, Endocrine, Cardiovascular, Eyes, Respiratory, Psychiatric, Allergic/Imm, Heme/Lymph Last edited by Hurman Horn, MD on 04/11/2021 10:41 AM.       ALLERGIES Allergies  Allergen Reactions   Penicillins Nausea Only    Has patient had a PCN reaction causing immediate rash, facial/tongue/throat swelling, SOB or lightheadedness with hypotension: No Has patient had a PCN reaction causing severe rash involving mucus membranes or skin necrosis: No Has patient had a PCN reaction that required hospitalization No Has patient had a PCN reaction occurring within the last 10 years: No If all of the above answers are "NO", then may proceed with Cephalosporin use.  PAST MEDICAL HISTORY Past Medical History:  Diagnosis Date   Age-related macular degeneration, dry, right eye    Arthritis    Coronary atherosclerosis of native coronary artery    PTCA/stent to LAD and diagonal 2001; DES to LAD, DES to RCA, DES x 2 to circumflex/OM1 07/2016   CVA (cerebral vascular accident) [facility]) 01/2011   Resolved dysarthria and right hemiparesis   CVA (cerebral vascular accident) ([place]) 06/2014   Drug intolerance    ACE inhibitor; related to cough    Essential hypertension    Hyperlipidemia    MI (myocardial infarction) ([place]) 2000   Partial small bowel obstruction ([place]) 12//2012   Resolved spontaneously.   Refusal of blood  transfusions as patient is [name]    Past Surgical History:  Procedure Laterality Date   APPENDECTOMY     CATARACT EXTRACTION W/PHACO Right 05/14/2015   Procedure: CATARACT EXTRACTION PHACO AND INTRAOCULAR LENS PLACEMENT RIGHT EYE CDE=6.96;  Surgeon: Williams Che, MD;  Location: [facility];  Service: Ophthalmology;  Laterality: Right;   CATARACT EXTRACTION W/PHACO Left 05/2019   COLONOSCOPY W/ BIOPSIES AND POLYPECTOMY  2005   CORONARY ANGIOPLASTY WITH STENT PLACEMENT  08/21/2016   CORONARY STENT INTERVENTION N/A 08/21/2016   Procedure: Coronary Stent Intervention;  Surgeon: Burnell Blanks, MD;  Location: [place] [facility];  Service: Cardiovascular;  Laterality: N/A;   LEFT HEART CATH AND CORONARY ANGIOGRAPHY N/A 08/21/2016   Procedure: Left Heart Cath and Coronary Angiography;  Surgeon: Burnell Blanks, MD;  Location: [place] [facility];  Service: Cardiovascular;  Laterality: N/A;   TONSILLECTOMY      FAMILY HISTORY Family History  Problem Relation Age of Onset   Cancer Father    Heart failure Father    Hyperlipidemia Father    Diabetes Sister    Hypertension Sister    Cancer Mother    Cancer Brother    Cancer Sister     SOCIAL HISTORY Social History   Tobacco Use   Smoking status: Former    Packs/day: 1.00    Years: 4.00    Pack years: 4.00    Types: Cigarettes    Quit date: 10/22/1958    Years since quitting: 62.5   Smokeless tobacco: Never  Vaping Use   Vaping Use: Never used  Substance Use Topics   Alcohol use: No    Alcohol/week: 0.0 standard drinks   Drug use: No         OPHTHALMIC EXAM:  Base Eye Exam     Visual Acuity (ETDRS)       Right Left   Dist [place] 20/50 20/400   Dist ph [place] 20/40 -1 NI         Tonometry (Tonopen, 10:20 AM)       Right Left   Pressure 12 13         Pupils       Dark Light Shape React APD   Right 2 2 Round Minimal None   Left [employer]. Dilated    None         Extraocular Movement        Right Left    Full Full         Neuro/Psych     Oriented x3: Yes   Mood/Affect: Normal         Dilation     Left eye: 1.0% Mydriacyl, 2.5% Phenylephrine @ 10:20 AM           Slit Lamp and Fundus  Exam     External Exam       Right Left   External Normal Normal         Slit Lamp Exam       Right Left   Lids/Lashes Normal Normal   Conjunctiva/Sclera White and quiet White and quiet   Cornea Clear Clear   Anterior Chamber Deep and quiet Deep and quiet   Iris Round and reactive Round and reactive   Lens Centered posterior chamber intraocular lens Centered posterior chamber intraocular lens   Anterior Vitreous Normal Normal         Fundus Exam       Right Left   Posterior Vitreous  Avitric, diffuse haze, mild   Disc  Normal   C/D Ratio  0.05   Macula  Topo distortion, post ILM movable changes   Vessels  Normal   Periphery  Normal            IMAGING AND PROCEDURES  Imaging and Procedures for 04/11/21           ASSESSMENT/PLAN:  Left epiretinal membrane Postop day #1 status post vitrectomy membrane peel left eye, no gas injection required.  Patient will commence topical medications today  Patient restarted Plavix and aspirin this morning      Exudative age-related macular degeneration of right eye with active choroidal neovascularization (HCC) OD, stable follow-up as scheduled     ICD-10-CM   1. Left epiretinal membrane  H35.372     2. Exudative age-related macular degeneration of right eye with active choroidal neovascularization ([place])  H35.3211       1.  OS looks great today, will start topical eye medications  2.  Patient confirmed he restarted his Plavix and aspirin this morning  3.  Patient's normal restrictions of 10 days for postop limitations will be extended to 14 days due to his need for use of blood thinners  Ophthalmic Meds Ordered this visit:  No orders of the defined types were placed in this encounter.       Return in about 1 week (around 04/18/2021) for dilate, OS, OCT.  Patient Instructions  Ofloxacin  4 times daily to the operative eye  Prednisolone acetate 1 drop to the operative eye 4 times daily  Patient instructed not to refill the medications and use them for maximum of 3 weeks.  Patient instructed do not rub the eye.  Patient has the option to use the patch at night.   No lifting and bending for 1 week. No water IN the eye for 10 days. Do not rub the eye. Wear shield at night for 1-3 days.  Continue your topical medications for a total of 3 weeks.  Do not refill your postoperative medications unless instructed.  Refrain from exercise or intentional activity which increases our heart rate above resting levels.  Normal walking to complete normal activities of your day are appropriate.  Driving:  Legally, you only need one good eye, of 20/40 or better to drive.  However, the practice does not recommend driving during first weeks after surgery, IF you are uncomfortable with your visual functioning or capabilities.   If you have known sleep apnea, wear your CPAP as you normally should.    Explained the diagnoses, plan, and follow up with the patient and they expressed understanding.  Patient expressed understanding of the importance of proper follow up care.   Clent Demark Latash Nouri M.D. Diseases & Surgery of the Retina and Vitreous Retina & Diabetic  [place] 04/11/21     Abbreviations: M myopia (nearsighted); A astigmatism; H hyperopia (farsighted); P presbyopia; Mrx spectacle prescription;  CTL contact lenses; OD right eye; OS left eye; OU both eyes  XT exotropia; ET esotropia; PEK punctate epithelial keratitis; PEE punctate epithelial erosions; DES dry eye syndrome; MGD meibomian gland dysfunction; ATs artificial tears; PFAT's preservative free artificial tears; [place] nuclear sclerotic cataract; PSC posterior subcapsular cataract; ERM epi-retinal membrane; PVD posterior vitreous  detachment; RD retinal detachment; DM diabetes mellitus; DR diabetic retinopathy; NPDR non-proliferative diabetic retinopathy; PDR proliferative diabetic retinopathy; CSME clinically significant macular edema; DME diabetic macular edema; dbh dot blot hemorrhages; CWS cotton wool spot; POAG primary open angle glaucoma; C/D cup-to-disc ratio; HVF humphrey visual field; GVF goldmann visual field; OCT optical coherence tomography; IOP intraocular pressure; BRVO Branch retinal vein occlusion; CRVO central retinal vein occlusion; CRAO central retinal artery occlusion; BRAO branch retinal artery occlusion; RT retinal tear; SB scleral buckle; PPV pars plana vitrectomy; VH Vitreous hemorrhage; PRP panretinal laser photocoagulation; IVK intravitreal kenalog; VMT vitreomacular traction; MH Macular hole;  NVD neovascularization of the disc; NVE neovascularization elsewhere; AREDS age related eye disease study; ARMD age related macular degeneration; POAG primary open angle glaucoma; EBMD epithelial/anterior basement membrane dystrophy; ACIOL anterior chamber intraocular lens; IOL intraocular lens; PCIOL posterior chamber intraocular lens; Phaco/IOL phacoemulsification with intraocular lens placement; [place] photorefractive keratectomy; LASIK laser assisted in situ keratomileusis; HTN hypertension; DM diabetes mellitus; COPD chronic obstructive pulmonary disease

## 2021-04-17 ENCOUNTER — Other Ambulatory Visit: Payer: Self-pay

## 2021-04-17 ENCOUNTER — Ambulatory Visit (INDEPENDENT_AMBULATORY_CARE_PROVIDER_SITE_OTHER): Payer: PPO | Admitting: Ophthalmology

## 2021-04-17 ENCOUNTER — Encounter (INDEPENDENT_AMBULATORY_CARE_PROVIDER_SITE_OTHER): Payer: Self-pay | Admitting: Ophthalmology

## 2021-04-17 DIAGNOSIS — H35372 Puckering of macula, left eye: Secondary | ICD-10-CM

## 2021-04-17 DIAGNOSIS — H353211 Exudative age-related macular degeneration, right eye, with active choroidal neovascularization: Secondary | ICD-10-CM

## 2021-04-17 NOTE — Patient Instructions (Signed)
Ofloxacin  4 times daily to the operative eye  Prednisolone acetate 1 drop to the operative eye 4 times daily  Patient instructed not to refill the medications and use them for maximum of 3 weeks.  Patient instructed do not rub the eye.  Patient has the option to use the patch at night. 

## 2021-04-17 NOTE — Progress Notes (Signed)
04/17/2021     CHIEF COMPLAINT Patient presents for  Chief Complaint  Patient presents with   Post-op Follow-up      HISTORY OF PRESENT ILLNESS: [name] [name] is a 81 y.o. male who presents to the clinic today for:   HPI     Post-op Follow-up           Laterality: left eye   Discomfort: tearing.  Negative for pain and itching   Vision: is stable         Comments   1 week PO OS sx 04/10/2021, dilate OS, oct. Patient states vision is stable and unchanged since last visit. Denies any new floaters or FOL. Pt states "my eye seem slike it has a fog in front of it." Pt states he has been wearing the eye shield at night. Pt is using prednisolone and ofloxacin QID OS.        Last edited by Hurman Horn, MD on 04/17/2021  9:44 AM.      Referring physician: Celene Squibb, MD [address] [place],  [place] [address]  HISTORICAL INFORMATION:   Selected notes from the MEDICAL RECORD NUMBER    Lab Results  Component Value Date   HGBA1C 6.0 (H) 07/14/2014     CURRENT MEDICATIONS: Current Outpatient Medications (Ophthalmic Drugs)  Medication Sig   Polyethyl Glycol-Propyl Glycol (SYSTANE OP) Place 1 drop into both eyes 3 (three) times daily as needed (Dry Eyes).    PROLENSA 0.07 % SOLN Place 1 drop into the left eye daily.   No current facility-administered medications for this visit. (Ophthalmic Drugs)   Current Outpatient Medications (Other)  Medication Sig   aspirin 81 MG tablet Take 1 tablet (81 mg total) by mouth daily.   atorvastatin (LIPITOR) 40 MG tablet Take 40 mg by mouth daily.   Cholecalciferol (VITAMIN D3) 2000 units TABS Take 2,000 Units by mouth daily.   clopidogrel (PLAVIX) 75 MG tablet Take 75 mg by mouth daily.   nitroGLYCERIN (NITROSTAT) 0.4 MG SL tablet Place 1 tablet (0.4 mg total) under the tongue every 5 (five) minutes x 3 doses as needed (if no relief after 3rd dose, proceed to the ED for an evaluation or call 911).   polycarbophil  (FIBERCON) 625 MG tablet Take 1,250 mg by mouth daily.   telmisartan (MICARDIS) 80 MG tablet Take 80 mg by mouth daily.    UNABLE TO FIND 1 Syringe by Intravitreal route See admin instructions. Patient receives eye injections every 6 to 8 weeks.   No current facility-administered medications for this visit. (Other)      REVIEW OF SYSTEMS: ROS   Negative for: Constitutional, Gastrointestinal, Neurological, Skin, Genitourinary, Musculoskeletal, HENT, Endocrine, Cardiovascular, Eyes, Respiratory, Psychiatric, Allergic/Imm, Heme/Lymph Last edited by Hurman Horn, MD on 04/17/2021  9:43 AM.       ALLERGIES Allergies  Allergen Reactions   Penicillins Nausea Only    Has patient had a PCN reaction causing immediate rash, facial/tongue/throat swelling, SOB or lightheadedness with hypotension: No Has patient had a PCN reaction causing severe rash involving mucus membranes or skin necrosis: No Has patient had a PCN reaction that required hospitalization No Has patient had a PCN reaction occurring within the last 10 years: No If all of the above answers are "NO", then may proceed with Cephalosporin use.     PAST MEDICAL HISTORY Past Medical History:  Diagnosis Date   Age-related macular degeneration, dry, right eye    Arthritis  Coronary atherosclerosis of native coronary artery    PTCA/stent to LAD and diagonal 2001; DES to LAD, DES to RCA, DES x 2 to circumflex/OM1 07/2016   CVA [facility]cere[facility] vascular accident[facility]) 01/2011   Resolved dysarthria and right hemiparesis   CVA (cerebral vascular accident) ([place]) 06/2014   Drug intolerance    ACE inhibitor; related to cough    Essential hypertension    Hyperlipidemia    MI (myocardial infarction) ([place]) 2000   Partial small bowel obstruction ([place]) 12//2012   Resolved spontaneously.   Refusal of blood transfusions as patient is [name]    Past Surgical History:  Procedure Laterality Date   APPENDECTOMY     CATARACT  EXTRACTION W/PHACO Right 05/14/2015   Procedure: CATARACT EXTRACTION PHACO AND INTRAOCULAR LENS PLACEMENT RIGHT EYE CDE=6.96;  Surgeon: Williams Che, MD;  Location: [facility];  Service: Ophthalmology;  Laterality: Right;   CATARACT EXTRACTION W/PHACO Left 05/2019   COLONOSCOPY W/ BIOPSIES AND POLYPECTOMY  2005   CORONARY ANGIOPLASTY WITH STENT PLACEMENT  08/21/2016   CORONARY STENT INTERVENTION N/A 08/21/2016   Procedure: Coronary Stent Intervention;  Surgeon: Burnell Blanks, MD;  Location: [place] [facility];  Service: Cardiovascular;  Laterality: N/A;   LEFT HEART CATH AND CORONARY ANGIOGRAPHY N/A 08/21/2016   Procedure: Left Heart Cath and Coronary Angiography;  Surgeon: Burnell Blanks, MD;  Location: [place] [facility];  Service: Cardiovascular;  Laterality: N/A;   TONSILLECTOMY      FAMILY HISTORY Family History  Problem Relation Age of Onset   Cancer Father    Heart failure Father    Hyperlipidemia Father    Diabetes Sister    Hypertension Sister    Cancer Mother    Cancer Brother    Cancer Sister     SOCIAL HISTORY Social History   Tobacco Use   Smoking status: Former    Packs/day: 1.00    Years: 4.00    Pack years: 4.00    Types: Cigarettes    Quit date: 10/22/1958    Years since quitting: 62.5   Smokeless tobacco: Never  Vaping Use   Vaping Use: Never used  Substance Use Topics   Alcohol use: No    Alcohol/week: 0.0 standard drinks   Drug use: No         OPHTHALMIC EXAM:  Base Eye Exam     Visual Acuity (ETDRS)       Right Left   Dist [place] 20/40 -2 20/100 +1   Dist ph [place] 20/25 -1 NI         Tonometry (Tonopen, 9:27 AM)       Right Left   Pressure 12 19         Pupils       Pupils Dark Light Shape React APD   Right PERRL 2 2 Round Minimal None   Left PERRL 2 2 Round Minimal None         Extraocular Movement       Right Left    Full Full         Neuro/Psych     Oriented x3: Yes   Mood/Affect: Normal          Dilation     Left eye: 1.0% Mydriacyl, 2.5% Phenylephrine @ 9:27 AM           Slit Lamp and Fundus Exam     External Exam       Right Left   External Normal Normal  Slit Lamp Exam       Right Left   Lids/Lashes Normal Normal   Conjunctiva/Sclera White and quiet White and quiet   Cornea Clear Clear   Anterior Chamber Deep and quiet Deep and quiet   Iris Round and reactive Round and reactive   Lens Centered posterior chamber intraocular lens Centered posterior chamber intraocular lens   Anterior Vitreous Normal Normal         Fundus Exam       Right Left   Posterior Vitreous  Avitric, diffuse haze, mild   Disc  Normal   C/D Ratio  0.05   Macula  Topo distortion, post ILM movable changes   Vessels  Normal   Periphery  Normal            IMAGING AND PROCEDURES  Imaging and Procedures for 04/17/21  OCT, Retina - OU - Both Eyes       Right Eye Quality was good. Scan locations included subfoveal. Central Foveal Thickness: 358. Progression has been stable. Findings include abnormal foveal contour, subretinal fluid, pigment epithelial detachment.   Left Eye Quality was good. Scan locations included subfoveal. Progression has been stable. Findings include abnormal foveal contour.   Notes Subretinal fluid overlying pigment epithelial detachment vascularized, nasal to the fovea OD, stable on Avastin currently at 4-week follow-up.   Post epiretinal membrane peel, much less thickening centrally.,  Some medial opacity             ASSESSMENT/PLAN:  Left epiretinal membrane 1 week postop, macula has less thickening, more normal normal appearance.  Some medial opacity limits acuity however  Exudative age-related macular degeneration of right eye with active choroidal neovascularization (HCC) OD follow-up as scheduled 05-16-2021     ICD-10-CM   1. Left epiretinal membrane  H35.372 OCT, Retina - OU - Both Eyes    2. Exudative age-related  macular degeneration of right eye with active choroidal neovascularization ([place])  H35.3211       1.  OS looks great 1 week postop.  Much less topographic distortion post membrane peel.  2.  Patient to continue on topical medications for the next 2 weeks.  3.  Ophthalmic Meds Ordered this visit:  No orders of the defined types were placed in this encounter.      Return in about 2 weeks (around 05/01/2021) for POST OP, OS, OCT.  Patient Instructions  Ofloxacin  4 times daily to the operative eye  Prednisolone acetate 1 drop to the operative eye 4 times daily  Patient instructed not to refill the medications and use them for maximum of 3 weeks.  Patient instructed do not rub the eye.  Patient has the option to use the patch at night.      Explained the diagnoses, plan, and follow up with the patient and they expressed understanding.  Patient expressed understanding of the importance of proper follow up care.   Clent Demark Casondra Gasca M.D. Diseases & Surgery of the Retina and Vitreous Retina & Diabetic [place] 04/17/21     Abbreviations: M myopia (nearsighted); A astigmatism; H hyperopia (farsighted); P presbyopia; Mrx spectacle prescription;  CTL contact lenses; OD right eye; OS left eye; OU both eyes  XT exotropia; ET esotropia; PEK punctate epithelial keratitis; PEE punctate epithelial erosions; DES dry eye syndrome; MGD meibomian gland dysfunction; ATs artificial tears; PFAT's preservative free artificial tears; [place] nuclear sclerotic cataract; PSC posterior subcapsular cataract; ERM epi-retinal membrane; PVD posterior vitreous detachment; RD retinal detachment; DM diabetes mellitus;  DR diabetic retinopathy; NPDR non-proliferative diabetic retinopathy; PDR proliferative diabetic retinopathy; CSME clinically significant macular edema; DME diabetic macular edema; dbh dot blot hemorrhages; CWS cotton wool spot; POAG primary open angle glaucoma; C/D cup-to-disc ratio; HVF humphrey visual  field; GVF goldmann visual field; OCT optical coherence tomography; IOP intraocular pressure; BRVO Branch retinal vein occlusion; CRVO central retinal vein occlusion; CRAO central retinal artery occlusion; BRAO branch retinal artery occlusion; RT retinal tear; SB scleral buckle; PPV pars plana vitrectomy; VH Vitreous hemorrhage; PRP panretinal laser photocoagulation; IVK intravitreal kenalog; VMT vitreomacular traction; MH Macular hole;  NVD neovascularization of the disc; NVE neovascularization elsewhere; AREDS age related eye disease study; ARMD age related macular degeneration; POAG primary open angle glaucoma; EBMD epithelial/anterior basement membrane dystrophy; ACIOL anterior chamber intraocular lens; IOL intraocular lens; PCIOL posterior chamber intraocular lens; Phaco/IOL phacoemulsification with intraocular lens placement; [place] photorefractive keratectomy; LASIK laser assisted in situ keratomileusis; HTN hypertension; DM diabetes mellitus; COPD chronic obstructive pulmonary disease

## 2021-04-17 NOTE — Assessment & Plan Note (Signed)
1 week postop, macula has less thickening, more normal normal appearance.  Some medial opacity limits acuity however

## 2021-04-17 NOTE — Assessment & Plan Note (Signed)
OD follow-up as scheduled 05-16-2021

## 2021-04-18 ENCOUNTER — Encounter (INDEPENDENT_AMBULATORY_CARE_PROVIDER_SITE_OTHER): Payer: PPO | Admitting: Ophthalmology

## 2021-04-22 DIAGNOSIS — E782 Mixed hyperlipidemia: Secondary | ICD-10-CM | POA: Diagnosis not present

## 2021-04-22 DIAGNOSIS — R7303 Prediabetes: Secondary | ICD-10-CM | POA: Diagnosis not present

## 2021-04-25 DIAGNOSIS — I2510 Atherosclerotic heart disease of native coronary artery without angina pectoris: Secondary | ICD-10-CM | POA: Diagnosis not present

## 2021-04-25 DIAGNOSIS — E782 Mixed hyperlipidemia: Secondary | ICD-10-CM | POA: Diagnosis not present

## 2021-04-25 DIAGNOSIS — R1310 Dysphagia, unspecified: Secondary | ICD-10-CM | POA: Diagnosis not present

## 2021-04-25 DIAGNOSIS — R7303 Prediabetes: Secondary | ICD-10-CM | POA: Diagnosis not present

## 2021-04-25 DIAGNOSIS — I10 Essential (primary) hypertension: Secondary | ICD-10-CM | POA: Diagnosis not present

## 2021-04-25 DIAGNOSIS — Z125 Encounter for screening for malignant neoplasm of prostate: Secondary | ICD-10-CM | POA: Diagnosis not present

## 2021-04-25 DIAGNOSIS — H3530 Unspecified macular degeneration: Secondary | ICD-10-CM | POA: Diagnosis not present

## 2021-05-01 ENCOUNTER — Other Ambulatory Visit: Payer: Self-pay

## 2021-05-01 ENCOUNTER — Ambulatory Visit (INDEPENDENT_AMBULATORY_CARE_PROVIDER_SITE_OTHER): Payer: PPO | Admitting: Ophthalmology

## 2021-05-01 ENCOUNTER — Encounter (INDEPENDENT_AMBULATORY_CARE_PROVIDER_SITE_OTHER): Payer: Self-pay | Admitting: Ophthalmology

## 2021-05-01 DIAGNOSIS — H353211 Exudative age-related macular degeneration, right eye, with active choroidal neovascularization: Secondary | ICD-10-CM

## 2021-05-01 DIAGNOSIS — H35372 Puckering of macula, left eye: Secondary | ICD-10-CM

## 2021-05-01 NOTE — Assessment & Plan Note (Signed)
3 weeks postop vitrectomy membrane peel left eye with improved macular thickening and improving acuity ?

## 2021-05-01 NOTE — Assessment & Plan Note (Signed)
OD with peripapillary CNVM now 6 weeks post most recent injection still stable ?

## 2021-05-01 NOTE — Progress Notes (Signed)
05/01/2021     CHIEF COMPLAINT Patient presents for  Chief Complaint  Patient presents with   Post-op Follow-up      HISTORY OF PRESENT ILLNESS: [name] [name] is a 81 y.o. male who presents to the clinic today for:   HPI     Post-op Follow-up           Laterality: left eye   Discomfort: Negative for pain         Comments   2 weeks PO vitrectomy membrane peel OS, oct, sx 04/10/21. Patient states vision is stable and unchanged since last visit. Denies any new FOL.  Pt states " I have a floater around the left side of my eye.  The shadow he describes is more towards the center of his vision and not off to the edges of the vision to my questioning.  I have a shadow on the left side of my left eye."   Pt stopped using eye drops at 3 weeks PO.      Last edited by Hurman Horn, MD on 05/01/2021 10:33 AM.      Referring physician: Celene Squibb, MD [address] [place],  [place] [address]  HISTORICAL INFORMATION:   Selected notes from the MEDICAL RECORD NUMBER    Lab Results  Component Value Date   HGBA1C 6.0 (H) 07/14/2014     CURRENT MEDICATIONS: Current Outpatient Medications (Ophthalmic Drugs)  Medication Sig   Polyethyl Glycol-Propyl Glycol (SYSTANE OP) Place 1 drop into both eyes 3 (three) times daily as needed (Dry Eyes).    PROLENSA 0.07 % SOLN Place 1 drop into the left eye daily.   No current facility-administered medications for this visit. (Ophthalmic Drugs)   Current Outpatient Medications (Other)  Medication Sig   aspirin 81 MG tablet Take 1 tablet (81 mg total) by mouth daily.   atorvastatin (LIPITOR) 40 MG tablet Take 40 mg by mouth daily.   Cholecalciferol (VITAMIN D3) 2000 units TABS Take 2,000 Units by mouth daily.   clopidogrel (PLAVIX) 75 MG tablet Take 75 mg by mouth daily.   nitroGLYCERIN (NITROSTAT) 0.4 MG SL tablet Place 1 tablet (0.4 mg total) under the tongue every 5 (five) minutes x 3 doses as needed (if no relief after 3rd  dose, proceed to the ED for an evaluation or call 911).   polycarbophil (FIBERCON) 625 MG tablet Take 1,250 mg by mouth daily.   telmisartan (MICARDIS) 80 MG tablet Take 80 mg by mouth daily.    UNABLE TO FIND 1 Syringe by Intravitreal route See admin instructions. Patient receives eye injections every 6 to 8 weeks.   No current facility-administered medications for this visit. (Other)      REVIEW OF SYSTEMS: ROS   Negative for: Constitutional, Gastrointestinal, Neurological, Skin, Genitourinary, Musculoskeletal, HENT, Endocrine, Cardiovascular, Eyes, Respiratory, Psychiatric, Allergic/Imm, Heme/Lymph Last edited by Hurman Horn, MD on 05/01/2021 10:28 AM.       ALLERGIES Allergies  Allergen Reactions   Penicillins Nausea Only    Has patient had a PCN reaction causing immediate rash, facial/tongue/throat swelling, SOB or lightheadedness with hypotension: No Has patient had a PCN reaction causing severe rash involving mucus membranes or skin necrosis: No Has patient had a PCN reaction that required hospitalization No Has patient had a PCN reaction occurring within the last 10 years: No If all of the above answers are "NO", then may proceed with Cephalosporin use.     PAST MEDICAL HISTORY Past Medical  History:  Diagnosis Date   Age-related macular degeneration, dry, right eye    Arthritis    Coronary atherosclerosis of native coronary artery    PTCA/stent to LAD and diagonal 2001; DES to LAD, DES to RCA, DES x 2 to circumflex/OM1 07/2016   CVA (cerebral vascular accident) [facility]) 01/2011   Resolved dysarthria and right hemiparesis   CVA (cerebral vascular accident) ([place]) 06/2014   Drug intolerance    ACE inhibitor; related to cough    Essential hypertension    Hyperlipidemia    MI (myocardial infarction) ([place]) 2000   Partial small bowel obstruction ([place]) 12//2012   Resolved spontaneously.   Refusal of blood transfusions as patient is [name]    Past Surgical  History:  Procedure Laterality Date   APPENDECTOMY     CATARACT EXTRACTION W/PHACO Right 05/14/2015   Procedure: CATARACT EXTRACTION PHACO AND INTRAOCULAR LENS PLACEMENT RIGHT EYE CDE=6.96;  Surgeon: Williams Che, MD;  Location: [facility];  Service: Ophthalmology;  Laterality: Right;   CATARACT EXTRACTION W/PHACO Left 05/2019   COLONOSCOPY W/ BIOPSIES AND POLYPECTOMY  2005   CORONARY ANGIOPLASTY WITH STENT PLACEMENT  08/21/2016   CORONARY STENT INTERVENTION N/A 08/21/2016   Procedure: Coronary Stent Intervention;  Surgeon: Burnell Blanks, MD;  Location: [place] [facility];  Service: Cardiovascular;  Laterality: N/A;   LEFT HEART CATH AND CORONARY ANGIOGRAPHY N/A 08/21/2016   Procedure: Left Heart Cath and Coronary Angiography;  Surgeon: Burnell Blanks, MD;  Location: [place] [facility];  Service: Cardiovascular;  Laterality: N/A;   TONSILLECTOMY      FAMILY HISTORY Family History  Problem Relation Age of Onset   Cancer Father    Heart failure Father    Hyperlipidemia Father    Diabetes Sister    Hypertension Sister    Cancer Mother    Cancer Brother    Cancer Sister     SOCIAL HISTORY Social History   Tobacco Use   Smoking status: Former    Packs/day: 1.00    Years: 4.00    Pack years: 4.00    Types: Cigarettes    Quit date: 10/22/1958    Years since quitting: 62.5   Smokeless tobacco: Never  Vaping Use   Vaping Use: Never used  Substance Use Topics   Alcohol use: No    Alcohol/week: 0.0 standard drinks   Drug use: No         OPHTHALMIC EXAM:  Base Eye Exam     Visual Acuity (ETDRS)       Right Left   Dist [place] 20/40 -2 20/60 -1   Dist ph [place] 20/30 -2 NI         Tonometry (Tonopen, 9:46 AM)       Right Left   Pressure 19 19         Pupils       Pupils Dark Light React APD   Right PERRL 2 2 Minimal None   Left PERRL 2 2 Minimal None         Extraocular Movement       Right Left    Full Full         Neuro/Psych      Oriented x3: Yes   Mood/Affect: Normal         Dilation     Left eye: 1.0% Mydriacyl, 2.5% Phenylephrine @ 9:46 AM           Slit Lamp and Fundus Exam  External Exam       Right Left   External Normal Normal         Slit Lamp Exam       Right Left   Lids/Lashes Normal Normal   Conjunctiva/Sclera White and quiet White and quiet   Cornea Clear Clear   Anterior Chamber Deep and quiet Deep and quiet   Iris Round and reactive Round and reactive   Lens Centered posterior chamber intraocular lens Centered posterior chamber intraocular lens   Anterior Vitreous Normal Normal         Fundus Exam       Right Left   Posterior Vitreous  Avitric, clear   Disc  Normal   C/D Ratio  0.05   Macula  Topo distortion, post ILM movable changes   Vessels  Normal   Periphery  Normal, no holes or tears, clear view, 25 and 20 D examinations no holes or tears no RD            IMAGING AND PROCEDURES  Imaging and Procedures for 05/01/21  OCT, Retina - OU - Both Eyes       Right Eye Quality was good. Scan locations included subfoveal. Central Foveal Thickness: 358. Progression has been stable. Findings include abnormal foveal contour, subretinal fluid, pigment epithelial detachment.   Left Eye Quality was good. Scan locations included subfoveal. Central Foveal Thickness: 469. Progression has been stable. Findings include abnormal foveal contour.   Notes Subretinal fluid overlying pigment epithelial detachment vascularized, nasal to the fovea OD, stable on Avastin currently at 6-week follow-up.   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 Left epiretinal membrane  H35.372 OCT, Retina - OU - Both Eyes    2. Exudative age-related macular degeneration of right eye with active choroidal neovascularization ([place])  H35.3211       1.  OS, patient confirms off topical medications postoperatively left eye.  2.  Patient reports 20/60 vision today on examination and the shadow is likely near the center of the vision as he describes upon careful questioning.  Likely this is the healing process underway in the left eye as macular thickening is now less than preoperative.  3.  OD no sign of recurrence of wet AMD, will continue to monitor closely  Ophthalmic Meds Ordered this visit:  No orders of the defined types were placed in this encounter.      Return in about 3 weeks (around 05/22/2021) for DILATE OU, OCT, OD, OS,, possible Avastin OD.  There are no Patient Instructions on file for this visit.   Explained the diagnoses, plan, and follow up with the patient and they expressed understanding.  Patient expressed understanding of the importance of proper follow up care.   Clent Demark Keshauna Degraffenreid M.D. Diseases & Surgery of the Retina and Vitreous [facility]ina [facility] Diabetic [place] 05/01/21     Abbreviations: M myopia (nearsighted); A astigmatism; H hyperopia (farsighted); P presbyopia; Mrx spectacle prescription;  CTL contact lenses; OD right eye; OS left eye; OU both eyes  XT exotropia; ET esotropia; PEK punctate epithelial keratitis; PEE punctate  epithelial erosions; DES dry eye syndrome; MGD meibomian gland dysfunction; ATs artificial tears; PFAT's preservative free artificial tears; [place] nuclear sclerotic cataract; PSC posterior subcapsular cataract; ERM epi-retinal membrane; PVD posterior vitreous detachment; RD retinal detachment; DM diabetes mellitus; DR diabetic retinopathy; NPDR non-proliferative diabetic retinopathy; PDR proliferative diabetic retinopathy; CSME clinically  significant macular edema; DME diabetic macular edema; dbh dot blot hemorrhages; CWS cotton wool spot; POAG primary open angle glaucoma; C/D cup-to-disc ratio; HVF humphrey visual field; GVF goldmann visual field; OCT optical coherence tomography; IOP intraocular pressure; BRVO Branch retinal vein occlusion; CRVO central retinal vein occlusion; CRAO central retinal artery occlusion; BRAO branch retinal artery occlusion; RT retinal tear; SB scleral buckle; PPV pars plana vitrectomy; VH Vitreous hemorrhage; PRP panretinal laser photocoagulation; IVK intravitreal kenalog; VMT vitreomacular traction; MH Macular hole;  NVD neovascularization of the disc; NVE neovascularization elsewhere; AREDS age related eye disease study; ARMD age related macular degeneration; POAG primary open angle glaucoma; EBMD epithelial/anterior basement membrane dystrophy; ACIOL anterior chamber intraocular lens; IOL intraocular lens; PCIOL posterior chamber intraocular lens; Phaco/IOL phacoemulsification with intraocular lens placement; [place] photorefractive keratectomy; LASIK laser assisted in situ keratomileusis; HTN hypertension; DM diabetes mellitus; COPD chronic obstructive pulmonary disease

## 2021-05-16 ENCOUNTER — Encounter (INDEPENDENT_AMBULATORY_CARE_PROVIDER_SITE_OTHER): Payer: PPO | Admitting: Ophthalmology

## 2021-05-20 DIAGNOSIS — L570 Actinic keratosis: Secondary | ICD-10-CM | POA: Diagnosis not present

## 2021-05-20 DIAGNOSIS — X32XXXD Exposure to sunlight, subsequent encounter: Secondary | ICD-10-CM | POA: Diagnosis not present

## 2021-05-22 ENCOUNTER — Encounter (INDEPENDENT_AMBULATORY_CARE_PROVIDER_SITE_OTHER): Payer: Self-pay | Admitting: Ophthalmology

## 2021-05-22 ENCOUNTER — Ambulatory Visit (INDEPENDENT_AMBULATORY_CARE_PROVIDER_SITE_OTHER): Payer: PPO | Admitting: Ophthalmology

## 2021-05-22 ENCOUNTER — Other Ambulatory Visit: Payer: Self-pay

## 2021-05-22 DIAGNOSIS — H35372 Puckering of macula, left eye: Secondary | ICD-10-CM | POA: Diagnosis not present

## 2021-05-22 DIAGNOSIS — H353122 Nonexudative age-related macular degeneration, left eye, intermediate dry stage: Secondary | ICD-10-CM | POA: Diagnosis not present

## 2021-05-22 DIAGNOSIS — H353211 Exudative age-related macular degeneration, right eye, with active choroidal neovascularization: Secondary | ICD-10-CM

## 2021-05-22 MED ORDER — BEVACIZUMAB 2.5 MG/0.1ML IZ SOSY
2.5000 mg | PREFILLED_SYRINGE | INTRAVITREAL | Status: AC | PRN
  Administered 2021-05-22: 2.5 mg via INTRAVITREAL

## 2021-05-22 NOTE — Assessment & Plan Note (Signed)
No sign of CNVM OS 

## 2021-05-22 NOTE — Assessment & Plan Note (Signed)
Peripapillary lesion with today increased intraretinal fluid and subretinal fluid at 9 weeks post most recent Avastin injection, will need to repeat injection today and follow-up again in 7 weeks ?

## 2021-05-22 NOTE — Assessment & Plan Note (Signed)
OS, much less thickening centrally post ERM removal some 2-1/2 months prior continuing to heal with continued acuity improvement ?

## 2021-05-22 NOTE — Progress Notes (Signed)
? ? ?05/22/2021 ? ?  ? ?CHIEF COMPLAINT ?Patient presents for  ?Chief Complaint  ?Patient presents with  ? Retina Follow Up  ? ?History of wet AMD OD and 2-1/2 months post vitrectomy membrane peel left eye for ERM ? ? ?HISTORY OF PRESENT ILLNESS: ?[name] [name] is a 81 y.o. male who presents to the clinic today for:  ? ?HPI   ? ? Retina Follow Up   ? ?      ? Diagnosis: Other (ERM)  ? Laterality: left eye  ? Onset: 3 weeks ago  ? Severity: mild  ? Course: stable  ? ?  ?  ? ? Comments   ?3 weeks Dilate OU , OCT, Possible Avastin OD. ?Pt states vision in the left eye still has a shadow over it, and a floater on the left outside corner of left eye. He asks Dr. Zadie Rhine "how long until that clears up?" Pt denies changes in vision. ? ?  ?  ?Last edited by Laurin Coder on 05/22/2021  9:27 AM.  ?  ? ? ?Referring physician: ?Celene Squibb, MD ?[address] ?[address] ?[place],  [place] [address] ? ?HISTORICAL INFORMATION:  ? ?Selected notes from the [facility] ?  ? ?Lab Results  ?Component Value Date  ? HGBA1C 6.0 (H) 07/14/2014  ?  ? ?CURRENT MEDICATIONS: ?Current Outpatient Medications (Ophthalmic Drugs)  ?Medication Sig  ? Polyethyl Glycol-Propyl Glycol (SYSTANE OP) Place 1 drop into both eyes 3 (three) times daily as needed (Dry Eyes).   ? PROLENSA 0.07 % SOLN Place 1 drop into the left eye daily.  ? ?No current facility-administered medications for this visit. (Ophthalmic Drugs)  ? ?Current Outpatient Medications (Other)  ?Medication Sig  ? aspirin 81 MG tablet Take 1 tablet (81 mg total) by mouth daily.  ? atorvastatin (LIPITOR) 40 MG tablet Take 40 mg by mouth daily.  ? Cholecalciferol (VITAMIN D3) 2000 units TABS Take 2,000 Units by mouth daily.  ? clopidogrel (PLAVIX) 75 MG tablet Take 75 mg by mouth daily.  ? nitroGLYCERIN (NITROSTAT) 0.4 MG SL tablet Place 1 tablet (0.4 mg total) under the tongue every 5 (five) minutes x 3 doses as needed (if no relief after 3rd dose, proceed to the ED for an evaluation or call  911).  ? polycarbophil (FIBERCON) 625 MG tablet Take 1,250 mg by mouth daily.  ? telmisartan (MICARDIS) 80 MG tablet Take 80 mg by mouth daily.   ? UNABLE TO FIND 1 Syringe by Intravitreal route See admin instructions. Patient receives eye injections every 6 to 8 weeks.  ? ?No current facility-administered medications for this visit. (Other)  ? ? ? ? ?REVIEW OF SYSTEMS: ? ? ? ?ALLERGIES ?Allergies  ?Allergen Reactions  ? Penicillins Nausea Only  ?  Has patient had a PCN reaction causing immediate rash, facial/tongue/throat swelling, SOB or lightheadedness with hypotension: No ?Has patient had a PCN reaction causing severe rash involving mucus membranes or skin necrosis: No ?Has patient had a PCN reaction that required hospitalization No ?Has patient had a PCN reaction occurring within the last 10 years: No ?If all of the above answers are "NO", then may proceed with Cephalosporin use. ?  ? ? ?PAST MEDICAL HISTORY ?Past Medical History:  ?Diagnosis Date  ? Age-related macular degeneration, dry, right eye   ? Arthritis   ? Coronary atherosclerosis of native coronary artery   ? PTCA/stent to LAD and diagonal 2001; DES to LAD, DES to RCA, DES x 2  to circumflex/OM1 07/2016  ? CVA (cerebral vascular accident) ([place]) 01/2011  ? Resolved dysarthria and right hemiparesis  ? CVA (cerebral vascular accident) ([place]) 06/2014  ? Drug intolerance   ? ACE inhibitor; related to cough   ? Essential hypertension   ? Hyperlipidemia   ? MI (myocardial infarction) ([place]) 2000  ? Partial small bowel obstruction ([place]) 12//2012  ? Resolved spontaneously.  ? Refusal of blood transfusions as patient is [name]   ? ?Past Surgical History:  ?Procedure Laterality Date  ? APPENDECTOMY    ? CATARACT EXTRACTION W/PHACO Right 05/14/2015  ? Procedure: CATARACT EXTRACTION PHACO AND INTRAOCULAR LENS PLACEMENT RIGHT EYE CDE=6.96;  Surgeon: [place] Che, MD;  Location: [facility];  Service: Ophthalmology;  Laterality: Right;  ? CATARACT EXTRACTION  W/PHACO Left 05/2019  ? COLONOSCOPY W/ BIOPSIES AND POLYPECTOMY  2005  ? CORONARY ANGIOPLASTY WITH STENT PLACEMENT  08/21/2016  ? CORONARY STENT INTERVENTION N/A 08/21/2016  ? Procedure: Coronary Stent Intervention;  Surgeon: Burnell Blanks, MD;  Location: [facility];  Service: Cardiovascular;  Laterality: N/A;  ? LEFT HEART CATH AND CORONARY ANGIOGRAPHY N/A 08/21/2016  ? Procedure: Left Heart Cath and Coronary Angiography;  Surgeon: Burnell Blanks, MD;  Location: [facility];  Service: Cardiovascular;  Laterality: N/A;  ? TONSILLECTOMY    ? ? ?FAMILY HISTORY ?Family History  ?Problem Relation Age of Onset  ? Cancer Father   ? Heart failure Father   ? Hyperlipidemia Father   ? Diabetes Sister   ? Hypertension Sister   ? Cancer Mother   ? Cancer Brother   ? Cancer Sister   ? ? ?SOCIAL HISTORY ?Social History  ? ?Tobacco Use  ? Smoking status: Former  ?  Packs/day: 1.00  ?  Years: 4.00  ?  Pack years: 4.00  ?  Types: Cigarettes  ?  Quit date: 10/22/1958  ?  Years since quitting: 62.6  ? Smokeless tobacco: Never  ?Vaping Use  ? Vaping Use: Never used  ?Substance Use Topics  ? Alcohol use: No  ?  Alcohol/week: 0.0 standard drinks  ? Drug use: No  ? ?  ? ?  ? ?OPHTHALMIC EXAM: ? ?Base Eye Exam   ? ? Visual Acuity (ETDRS)   ? ?   Right Left  ? Dist [place] 20/50 +1 20/60 -2  ? Dist ph [place] 20/40 NI  ? ?  ?  ? ? Tonometry (Tonopen, 9:30 AM)   ? ?   Right Left  ? Pressure 13 16  ? ?  ?  ? ? Pupils   ? ?   Pupils Dark Light React APD  ? Right PERRL 2 2 Minimal None  ? Left PERRL 2 2 Minimal None  ? ?  ?  ? ? Extraocular Movement   ? ?   Right Left  ?  Full Full  ? ?  ?  ? ? Neuro/Psych   ? ? Oriented x3: Yes  ? Mood/Affect: Normal  ? ?  ?  ? ? Dilation   ? ? Both eyes: 1.0% Mydriacyl, 2.5% Phenylephrine @ 9:30 AM  ? ?  ?  ? ?  ? ?Slit Lamp and Fundus Exam   ? ? External Exam   ? ?   Right Left  ? External Normal Normal  ? ?  ?  ? ? Slit Lamp Exam   ? ?   Right Left  ? Lids/Lashes Normal Normal  ?  Conjunctiva/Sclera White  and quiet White and quiet  ? Cornea Clear Clear  ? Anterior Chamber Deep and quiet Deep and quiet  ? Iris Round and reactive Round and reactive  ? Lens Centered posterior chamber intraocular lens Centered posterior chamber intraocular lens  ? Anterior Vitreous Normal Normal  ? ?  ?  ? ? Fundus Exam   ? ?   Right Left  ? Posterior Vitreous Normal Avitric, clear  ? Disc Normal Normal  ? C/D Ratio 0.0 0.05  ? Macula Macular thickening, nasal to FAZ, subretinal neovascular membrane less active, Hard drusen Topo distortion, post ILM movable changes  ? Vessels Normal Normal  ? Periphery Normal Normal, no holes or tears, clear view, 25 and 20 D examinations no holes or tears no RD, and also confirmed with 90 D examination  ? ?  ?  ? ?  ? ? ?IMAGING AND PROCEDURES  ?Imaging and Procedures for 05/22/21 ? ?OCT, Retina - OU - Both Eyes   ? ?   ?Right Eye ?Quality was good. Scan locations included subfoveal. Central Foveal Thickness: 358. Progression has been stable. Findings include abnormal foveal contour, subretinal fluid, pigment epithelial detachment.  ? ?Left Eye ?Quality was good. Scan locations included subfoveal. Central Foveal Thickness: 451. Progression has been stable. Findings include abnormal foveal contour.  ? ?Notes ?Subretinal fluid overlying pigment epithelial detachment vascularized, nasal to the fovea OD, creased activity.  Post most recent use of Avastin currently at 9-week follow-up.  We will resume intravitreal Avastin OD today ? ? ?Post epiretinal membrane peel, much less thickening centrally.,  At 2-1/2 months post removal of ERM ? ?  ? ? ?  ?  ? ?  ?ASSESSMENT/PLAN: ? ?Left epiretinal membrane ?OS, much less thickening centrally post ERM removal some 2-1/2 months prior continuing to heal with continued acuity improvement ? ?Intermediate stage nonexudative age-related macular degeneration of left eye ?No sign of CNVM OS ? ?Exudative age-related macular degeneration of right eye  with active choroidal neovascularization ([place]) ?Peripapillary lesion with today increased intraretinal fluid and subretinal fluid at 9 weeks post most recent Avastin injection, will need to repeat injection

## 2021-07-10 ENCOUNTER — Ambulatory Visit (INDEPENDENT_AMBULATORY_CARE_PROVIDER_SITE_OTHER): Payer: PPO | Admitting: Ophthalmology

## 2021-07-10 ENCOUNTER — Encounter (INDEPENDENT_AMBULATORY_CARE_PROVIDER_SITE_OTHER): Payer: Self-pay | Admitting: Ophthalmology

## 2021-07-10 DIAGNOSIS — H353211 Exudative age-related macular degeneration, right eye, with active choroidal neovascularization: Secondary | ICD-10-CM

## 2021-07-10 DIAGNOSIS — H35372 Puckering of macula, left eye: Secondary | ICD-10-CM | POA: Diagnosis not present

## 2021-07-10 MED ORDER — BEVACIZUMAB 2.5 MG/0.1ML IZ SOSY
2.5000 mg | PREFILLED_SYRINGE | INTRAVITREAL | Status: AC | PRN
  Administered 2021-07-10: 2.5 mg via INTRAVITREAL

## 2021-07-10 NOTE — Assessment & Plan Note (Addendum)
Peripapillary CNVM slightly improved but no extension towards the macular region today at 7-week follow-up interval.  We will repeat a injection today Avastin to stabilize , follow-up in 7 weeks ?

## 2021-07-10 NOTE — Progress Notes (Signed)
? ? ?07/10/2021 ? ?  ? ?CHIEF COMPLAINT ?Patient presents for  ?Chief Complaint  ?Patient presents with  ? Macular Degeneration  ? ? ? ? ?HISTORY OF PRESENT ILLNESS: ?[name] [name] is a 81 y.o. male who presents to the clinic today for:  ? ?HPI   ?7 weeks dilate OD, Avastin OD, OCT. ?Patient states vision is stable and unchanged since last visit. Denies any new floaters or FOL. ? ?Last edited by Laurin Coder on 07/10/2021 10:15 AM.  ?  ? ? ?Referring physician: ?Celene Squibb, MD ?[address] ?[address] ?[place],  [place] [address] ? ?HISTORICAL INFORMATION:  ? ?Selected notes from the [facility] ?  ? ?Lab Results  ?Component Value Date  ? HGBA1C 6.0 (H) 07/14/2014  ?  ? ?CURRENT MEDICATIONS: ?Current Outpatient Medications (Ophthalmic Drugs)  ?Medication Sig  ? Polyethyl Glycol-Propyl Glycol (SYSTANE OP) Place 1 drop into both eyes 3 (three) times daily as needed (Dry Eyes).   ? PROLENSA 0.07 % SOLN Place 1 drop into the left eye daily.  ? ?No current facility-administered medications for this visit. (Ophthalmic Drugs)  ? ?Current Outpatient Medications (Other)  ?Medication Sig  ? aspirin 81 MG tablet Take 1 tablet (81 mg total) by mouth daily.  ? atorvastatin (LIPITOR) 40 MG tablet Take 40 mg by mouth daily.  ? Cholecalciferol (VITAMIN D3) 2000 units TABS Take 2,000 Units by mouth daily.  ? clopidogrel (PLAVIX) 75 MG tablet Take 75 mg by mouth daily.  ? nitroGLYCERIN (NITROSTAT) 0.4 MG SL tablet Place 1 tablet (0.4 mg total) under the tongue every 5 (five) minutes x 3 doses as needed (if no relief after 3rd dose, proceed to the ED for an evaluation or call 911).  ? polycarbophil (FIBERCON) 625 MG tablet Take 1,250 mg by mouth daily.  ? telmisartan (MICARDIS) 80 MG tablet Take 80 mg by mouth daily.   ? UNABLE TO FIND 1 Syringe by Intravitreal route See admin instructions. Patient receives eye injections every 6 to 8 weeks.  ? ?No current facility-administered medications for this visit. (Other)  ? ? ? ? ?REVIEW  OF SYSTEMS: ?ROS   ?Negative for: Constitutional, Gastrointestinal, Neurological, Skin, Genitourinary, Musculoskeletal, HENT, Endocrine, Cardiovascular, Eyes, Respiratory, Psychiatric, Allergic/Imm, Heme/Lymph ?Last edited by Hurman Horn, MD on 07/10/2021 11:17 AM.  ?  ? ? ? ?ALLERGIES ?Allergies  ?Allergen Reactions  ? Penicillins Nausea Only  ?  Has patient had a PCN reaction causing immediate rash, facial/tongue/throat swelling, SOB or lightheadedness with hypotension: No ?Has patient had a PCN reaction causing severe rash involving mucus membranes or skin necrosis: No ?Has patient had a PCN reaction that required hospitalization No ?Has patient had a PCN reaction occurring within the last 10 years: No ?If all of the above answers are "NO", then may proceed with Cephalosporin use. ?  ? ? ?PAST MEDICAL HISTORY ?Past Medical History:  ?Diagnosis Date  ? Age-related macular degeneration, dry, right eye   ? Arthritis   ? Coronary atherosclerosis of native coronary artery   ? PTCA/stent to LAD and diagonal 2001; DES to LAD, DES to RCA, DES x 2 to circumflex/OM1 07/2016  ? CVA (cerebral vascular accident) ([place]) 01/2011  ? Resolved dysarthria and right hemiparesis  ? CVA (cerebral vascular accident) ([place]) 06/2014  ? Drug intolerance   ? ACE inhibitor; related to cough   ? Essential hypertension   ? Hyperlipidemia   ? MI (myocardial infarction) ([place]) 2000  ? Partial small bowel obstruction (  [place]) 12//2012  ? Resolved spontaneously.  ? Refusal of blood transfusions as patient is [name]   ? ?Past Surgical History:  ?Procedure Laterality Date  ? APPENDECTOMY    ? CATARACT EXTRACTION W/PHACO Right 05/14/2015  ? Procedure: CATARACT EXTRACTION PHACO AND INTRAOCULAR LENS PLACEMENT RIGHT EYE CDE=6.96;  Surgeon: Williams Che, MD;  Location: [facility];  Service: Ophthalmology;  Laterality: Right;  ? CATARACT EXTRACTION W/PHACO Left 05/2019  ? COLONOSCOPY W/ BIOPSIES AND POLYPECTOMY  2005  ? CORONARY ANGIOPLASTY WITH  STENT PLACEMENT  08/21/2016  ? CORONARY STENT INTERVENTION N/A 08/21/2016  ? Procedure: Coronary Stent Intervention;  Surgeon: Burnell Blanks, MD;  Location: [facility];  Service: Cardiovascular;  Laterality: N/A;  ? LEFT HEART CATH AND CORONARY ANGIOGRAPHY N/A 08/21/2016  ? Procedure: Left Heart Cath and Coronary Angiography;  Surgeon: Burnell Blanks, MD;  Location: [facility];  Service: Cardiovascular;  Laterality: N/A;  ? TONSILLECTOMY    ? ? ?FAMILY HISTORY ?Family History  ?Problem Relation Age of Onset  ? Cancer Father   ? Heart failure Father   ? Hyperlipidemia Father   ? Diabetes Sister   ? Hypertension Sister   ? Cancer Mother   ? Cancer Brother   ? Cancer Sister   ? ? ?SOCIAL HISTORY ?Social History  ? ?Tobacco Use  ? Smoking status: Former  ?  Packs/day: 1.00  ?  Years: 4.00  ?  Pack years: 4.00  ?  Types: Cigarettes  ?  Quit date: 10/22/1958  ?  Years since quitting: 62.7  ? Smokeless tobacco: Never  ?Vaping Use  ? Vaping Use: Never used  ?Substance Use Topics  ? Alcohol use: No  ?  Alcohol/week: 0.0 standard drinks  ? Drug use: No  ? ?  ? ?  ? ?OPHTHALMIC EXAM: ? ?Base Eye Exam   ? ? Visual Acuity (ETDRS)   ? ?   Right Left  ? Dist [place] 20/50 +1 20/60 -1  ? Dist ph [place] 20/30 20/60 +2  ? ?  ?  ? ? Tonometry (Tonopen, 10:19 AM)   ? ?   Right Left  ? Pressure 9 13  ? ?  ?  ? ? Pupils   ? ?   Pupils Dark Light React APD  ? Right PERRL 2 2 Minimal None  ? Left PERRL 2 2 Minimal None  ? ?  ?  ? ? Extraocular Movement   ? ?   Right Left  ?  Full Full  ? ?  ?  ? ? Neuro/Psych   ? ? Oriented x3: Yes  ? Mood/Affect: Normal  ? ?  ?  ? ? Dilation   ? ? Right eye: 1.0% Mydriacyl, 2.5% Phenylephrine @ 10:19 AM  ? ?  ?  ? ?  ? ?Slit Lamp and Fundus Exam   ? ? External Exam   ? ?   Right Left  ? External Normal Normal  ? ?  ?  ? ? Slit Lamp Exam   ? ?   Right Left  ? Lids/Lashes Normal Normal  ? Conjunctiva/Sclera White and quiet White and quiet  ? Cornea Clear Clear  ? Anterior Chamber Deep  and quiet Deep and quiet  ? Iris Round and reactive Round and reactive  ? Lens Centered posterior chamber intraocular lens Centered posterior chamber intraocular lens  ? Anterior Vitreous Normal Normal  ? ?  ?  ? ? Fundus Exam   ? ?  Right Left  ? Posterior Vitreous Normal   ? Disc Normal   ? C/D Ratio 0.0   ? Macula Macular thickening, nasal to FAZ, subretinal neovascular membrane less active, Hard drusen   ? Vessels Normal   ? Periphery Normal   ? ?  ?  ? ?  ? ? ?IMAGING AND PROCEDURES  ?Imaging and Procedures for 07/10/21 ? ?Intravitreal Injection, Pharmacologic Agent - OD - Right Eye   ? ?   ?Time Out ?07/10/2021. 11:19 AM. Confirmed correct patient, procedure, site, and patient consented.  ? ?Anesthesia ?Topical anesthesia was used. Anesthetic medications included Lidocaine 4%.  ? ?Procedure ?Preparation included 5% betadine to ocular surface, 10% betadine to eyelids, Tobramycin 0.3%. A 30 gauge needle was used.  ? ?Injection: ?2.5 mg bevacizumab 2.5 MG/0.1ML ?  Route: Intravitreal, Site: Right Eye ?  [place]: 54562-563-89, Lot: [id number], Expiration date: 08/29/2021  ? ?Post-op ?Post injection exam found visual acuity of at least counting fingers. The patient tolerated the procedure well. There were no complications. The patient received written and verbal post procedure care education. Post injection medications included ocuflox.  ? ?  ? ?OCT, Retina - OU - Both Eyes   ? ?   ?Right Eye ?Quality was good. Scan locations included subfoveal. Central Foveal Thickness: 361. Progression has been stable. Findings include abnormal foveal contour, subretinal fluid, pigment epithelial detachment.  ? ?Left Eye ?Quality was good. Scan locations included subfoveal. Central Foveal Thickness: 441. Progression has been stable. Findings include abnormal foveal contour.  ? ?Notes ?Subretinal fluid overlying pigment epithelial detachment vascularized, nasal to the fovea OD, decreased activity.  Post most recent use of Avastin  currently at 7-week follow-up.  We will repeat intravitreal Avastin OD today ? ? ?Post epiretinal membrane peel, much less thickening centrally.,  At 2-1/2 months post removal of ERM ? ?  ? ? ?  ?  ? ?  ?ASSESS

## 2021-07-10 NOTE — Assessment & Plan Note (Signed)
OS vastly improved 3 months post vitrectomy membrane peel for severe epiretinal membrane ?

## 2021-08-28 ENCOUNTER — Encounter (INDEPENDENT_AMBULATORY_CARE_PROVIDER_SITE_OTHER): Payer: Self-pay | Admitting: Ophthalmology

## 2021-08-28 ENCOUNTER — Ambulatory Visit (INDEPENDENT_AMBULATORY_CARE_PROVIDER_SITE_OTHER): Payer: PPO | Admitting: Ophthalmology

## 2021-08-28 DIAGNOSIS — H35372 Puckering of macula, left eye: Secondary | ICD-10-CM | POA: Diagnosis not present

## 2021-08-28 DIAGNOSIS — H353211 Exudative age-related macular degeneration, right eye, with active choroidal neovascularization: Secondary | ICD-10-CM

## 2021-08-28 DIAGNOSIS — H353122 Nonexudative age-related macular degeneration, left eye, intermediate dry stage: Secondary | ICD-10-CM | POA: Diagnosis not present

## 2021-08-28 MED ORDER — BEVACIZUMAB 2.5 MG/0.1ML IZ SOSY
2.5000 mg | PREFILLED_SYRINGE | INTRAVITREAL | Status: AC | PRN
  Administered 2021-08-28: 2.5 mg via INTRAVITREAL

## 2021-08-28 NOTE — Assessment & Plan Note (Signed)
No sign of CNVM 

## 2021-08-28 NOTE — Progress Notes (Signed)
08/28/2021     CHIEF COMPLAINT Patient presents for  Chief Complaint  Patient presents with   Macular Degeneration      HISTORY OF PRESENT ILLNESS: [name] [name] is a 81 y.o. male who presents to the clinic today for:     Referring physician: Celene Squibb, MD [address] [place],  [place] [address]  HISTORICAL INFORMATION:   Selected notes from the MEDICAL RECORD NUMBER    Lab Results  Component Value Date   HGBA1C 6.0 (H) 07/14/2014     CURRENT MEDICATIONS: Current Outpatient Medications (Ophthalmic Drugs)  Medication Sig   Polyethyl Glycol-Propyl Glycol (SYSTANE OP) Place 1 drop into both eyes 3 (three) times daily as needed (Dry Eyes).    PROLENSA 0.07 % SOLN Place 1 drop into the left eye daily.   No current facility-administered medications for this visit. (Ophthalmic Drugs)   Current Outpatient Medications (Other)  Medication Sig   aspirin 81 MG tablet Take 1 tablet (81 mg total) by mouth daily.   atorvastatin (LIPITOR) 40 MG tablet Take 40 mg by mouth daily.   Cholecalciferol (VITAMIN D3) 2000 units TABS Take 2,000 Units by mouth daily.   clopidogrel (PLAVIX) 75 MG tablet Take 75 mg by mouth daily.   nitroGLYCERIN (NITROSTAT) 0.4 MG SL tablet Place 1 tablet (0.4 mg total) under the tongue every 5 (five) minutes x 3 doses as needed (if no relief after 3rd dose, proceed to the ED for an evaluation or call 911).   polycarbophil (FIBERCON) 625 MG tablet Take 1,250 mg by mouth daily.   telmisartan (MICARDIS) 80 MG tablet Take 80 mg by mouth daily.    UNABLE TO FIND 1 Syringe by Intravitreal route See admin instructions. Patient receives eye injections every 6 to 8 weeks.   No current facility-administered medications for this visit. (Other)      REVIEW OF SYSTEMS: ROS   Negative for: Constitutional, Gastrointestinal, Neurological, Skin, Genitourinary, Musculoskeletal, HENT, Endocrine, Cardiovascular, Eyes, Respiratory, Psychiatric, Allergic/Imm,  Heme/Lymph Last edited by Hurman Horn, MD on 08/28/2021 10:30 AM.       ALLERGIES Allergies  Allergen Reactions   Penicillins Nausea Only    Has patient had a PCN reaction causing immediate rash, facial/tongue/throat swelling, SOB or lightheadedness with hypotension: No Has patient had a PCN reaction causing severe rash involving mucus membranes or skin necrosis: No Has patient had a PCN reaction that required hospitalization No Has patient had a PCN reaction occurring within the last 10 years: No If all of the above answers are "NO", then may proceed with Cephalosporin use.     PAST MEDICAL HISTORY Past Medical History:  Diagnosis Date   Age-related macular degeneration, dry, right eye    Arthritis    Coronary atherosclerosis of native coronary artery    PTCA/stent to LAD and diagonal 2001; DES to LAD, DES to RCA, DES x 2 to circumflex/OM1 07/2016   CVA (cerebral vascular accident) [facility]) 01/2011   Resolved dysarthria and right hemiparesis   CVA (cerebral vascular accident) ([place]) 06/2014   Drug intolerance    ACE inhibitor; related to cough    Essential hypertension    Hyperlipidemia    MI (myocardial infarction) ([place]) 2000   Partial small bowel obstruction ([place]) 12//2012   Resolved spontaneously.   Refusal of blood transfusions as patient is [name]    Past Surgical History:  Procedure Laterality Date   APPENDECTOMY     CATARACT EXTRACTION W/PHACO Right 05/14/2015  Procedure: CATARACT EXTRACTION PHACO AND INTRAOCULAR LENS PLACEMENT RIGHT EYE CDE=6.96;  Surgeon: Williams Che, MD;  Location: [facility];  Service: Ophthalmology;  Laterality: Right;   CATARACT EXTRACTION W/PHACO Left 05/2019   COLONOSCOPY W/ BIOPSIES AND POLYPECTOMY  2005   CORONARY ANGIOPLASTY WITH STENT PLACEMENT  08/21/2016   CORONARY STENT INTERVENTION N/A 08/21/2016   Procedure: Coronary Stent Intervention;  Surgeon: Burnell Blanks, MD;  Location: [facility];  Service:  Cardiovascular;  Laterality: N/A;   LEFT HEART CATH AND CORONARY ANGIOGRAPHY N/A 08/21/2016   Procedure: Left Heart Cath and Coronary Angiography;  Surgeon: Burnell Blanks, MD;  Location: [facility];  Service: Cardiovascular;  Laterality: N/A;   TONSILLECTOMY      FAMILY HISTORY Family History  Problem Relation Age of Onset   Cancer Father    Heart failure Father    Hyperlipidemia Father    Diabetes Sister    Hypertension Sister    Cancer Mother    Cancer Brother    Cancer Sister     SOCIAL HISTORY Social History   Tobacco Use   Smoking status: Former    Packs/day: 1.00    Years: 4.00    Total pack years: 4.00    Types: Cigarettes    Quit date: 10/22/1958    Years since quitting: 62.8   Smokeless tobacco: Never  Vaping Use   Vaping Use: Never used  Substance Use Topics   Alcohol use: No    Alcohol/week: 0.0 standard drinks of alcohol   Drug use: No         OPHTHALMIC EXAM:  Base Eye Exam     Visual Acuity (ETDRS)       Right Left   Dist [place] 20/50 -1 20/80   Dist ph [place] 20/50 20/70 -1         Tonometry (Tonopen, 10:34 AM)       Right Left   Pressure 16 17         Pupils       Pupils APD   Right PERRL None   Left PERRL None         Visual Fields       Left Right    Full Full         Extraocular Movement       Right Left    Full, Ortho Full, Ortho         Neuro/Psych     Oriented x3: Yes   Mood/Affect: Normal         Dilation     Both eyes: 1.0% Mydriacyl, 2.5% Phenylephrine @ 10:35 AM           Slit Lamp and Fundus Exam     External Exam       Right Left   External Normal Normal         Slit Lamp Exam       Right Left   Lids/Lashes Normal Normal   Conjunctiva/Sclera White and quiet White and quiet   Cornea Clear Clear   Anterior Chamber Deep and quiet Deep and quiet   Iris Round and reactive Round and reactive   Lens Centered posterior chamber intraocular lens Centered posterior  chamber intraocular lens   Anterior Vitreous Normal Normal         Fundus Exam       Right Left   Posterior Vitreous Normal Avitric, clear   Disc Normal Normal   C/D Ratio 0.0  0.05   Macula Macular thickening, nasal to FAZ, subretinal neovascular membrane less active, Hard drusen No topographic distortion   Vessels Normal Normal   Periphery Normal Normal, no holes or tears, clear view, 25 and 20 D examinations no holes or tears no RD, and also confirmed with 90 D examination            IMAGING AND PROCEDURES  Imaging and Procedures for 08/28/21  OCT, Retina - OU - Both Eyes       Right Eye Quality was good. Scan locations included subfoveal. Central Foveal Thickness: 361. Progression has been stable. Findings include abnormal foveal contour, pigment epithelial detachment, subretinal fluid.   Left Eye Quality was good. Scan locations included subfoveal. Central Foveal Thickness: 439. Progression has been stable. Findings include abnormal foveal contour.   Notes Subretinal fluid overlying pigment epithelial detachment vascularized, nasal to the fovea OD, decreased activity.  Post most recent use of Avastin currently at 7-week follow-up.  We will repeat intravitreal Avastin OD today   Post epiretinal membrane peel, much less thickening centrally.,  At 5 months post removal of ERM     Intravitreal Injection, Pharmacologic Agent - OD - Right Eye       Time Out 08/28/2021. 11:20 AM. Confirmed correct patient, procedure, site, and patient consented.   Anesthesia Topical anesthesia was used. Anesthetic medications included Lidocaine 4%.   Procedure Preparation included 5% betadine to ocular surface, 10% betadine to eyelids, Tobramycin 0.3%. A 30 gauge needle was used.   Injection: 2.5 mg bevacizumab 2.5 MG/0.1ML   Route: Intravitreal, Site: Right Eye   [place]: [phone], Lot: [id number], Expiration date: 10/25/2021   Post-op Post injection exam found visual acuity of at  least counting fingers. The patient tolerated the procedure well. There were no complications. The patient received written and verbal post procedure care education. Post injection medications included ocuflox.              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 Exudative age-related macular degeneration of right eye with active choroidal neovascularization (HCC)  H35.3211 OCT, Retina - OU - Both Eyes    Intravitreal Injection, Pharmacologic Agent - OD - Right Eye    bevacizumab (AVASTIN) SOSY 2.5 mg    2. Left epiretinal membrane  H35.372 OCT, Retina - OU - Both Eyes    3. Intermediate stage nonexudative age-related macular degeneration of left eye  H35.3122       1.  Peripapillary CNVM still chronically active at 7-week post Avastin.  Repeat injection today to maintain  2.  OS slowly improving macular condition post vitrectomy membrane peel.  Acuity still has a chance to continue to improve over time.  No sign of wet AMD OS.  3.  Ophthalmic Meds Ordered this visit:  Meds ordered this encounter  Medications   bevacizumab (AVASTIN) SOSY 2.5 mg       Return in about 7 weeks (around 10/16/2021) for dilate, OD, AVASTIN OCT.  There are no Patient Instructions on file for this visit.   Explained the diagnoses, plan, and follow up with the patient and they expressed understanding.  Patient expressed understanding of the importance of proper follow up care.   Clent Demark Cory Rama M.D.  Diseases & Surgery of the Retina [facility] 08/28/21     Abbreviations: M myopia (nearsighted); A astigmatism; H hyperopia (farsighted); P  presbyopia; Mrx spectacle prescription;  CTL contact lenses; OD right eye; OS left eye; OU both eyes  XT exotropia; ET esotropia; PEK punctate epithelial keratitis; PEE punctate epithelial erosions; DES dry eye syndrome; MGD meibomian gland dysfunction; ATs artificial tears; PFAT's preservative free artificial tears; [place] nuclear sclerotic cataract; PSC posterior subcapsular cataract; ERM epi-retinal membrane; PVD posterior vitreous detachment; RD retinal detachment; DM diabetes mellitus; DR diabetic retinopathy; NPDR non-proliferative diabetic retinopathy; PDR proliferative diabetic retinopathy; CSME clinically significant macular edema; DME diabetic macular edema; dbh dot blot hemorrhages; CWS cotton wool spot; POAG primary open angle glaucoma; C/D cup-to-disc ratio; HVF humphrey visual field; GVF goldmann visual field; OCT optical coherence tomography; IOP intraocular pressure; BRVO Branch retinal vein occlusion; CRVO central retinal vein occlusion; CRAO central retinal artery occlusion; BRAO branch retinal artery occlusion; RT retinal tear; SB scleral buckle; PPV pars plana vitrectomy; VH Vitreous hemorrhage; PRP panretinal laser photocoagulation; IVK intravitreal kenalog; VMT vitreomacular traction; MH Macular hole;  NVD neovascularization of the disc; NVE neovascularization elsewhere; AREDS age related eye disease study; ARMD age related macular degeneration; POAG primary open angle glaucoma; EBMD epithelial/anterior basement membrane dystrophy; ACIOL anterior chamber intraocular lens; IOL intraocular lens; PCIOL posterior chamber intraocular lens; Phaco/IOL phacoemulsification with intraocular lens placement; [place] photorefractive keratectomy; LASIK laser assisted in situ keratomileusis; HTN hypertension; DM diabetes mellitus; COPD chronic obstructive pulmonary disease

## 2021-08-28 NOTE — Assessment & Plan Note (Signed)
Peripapillary CNVM smaller much less SRF

## 2021-08-28 NOTE — Assessment & Plan Note (Signed)
Thickening continues to slowly improve post ERM removal

## 2021-09-10 ENCOUNTER — Encounter: Payer: Self-pay | Admitting: Cardiology

## 2021-09-10 ENCOUNTER — Ambulatory Visit: Payer: PPO | Admitting: Cardiology

## 2021-09-10 VITALS — BP 142/70 | HR 67 | Ht 69.0 in | Wt 185.0 lb

## 2021-09-10 DIAGNOSIS — E782 Mixed hyperlipidemia: Secondary | ICD-10-CM | POA: Diagnosis not present

## 2021-09-10 DIAGNOSIS — I25119 Atherosclerotic heart disease of native coronary artery with unspecified angina pectoris: Secondary | ICD-10-CM | POA: Diagnosis not present

## 2021-09-10 NOTE — Patient Instructions (Addendum)

## 2021-09-10 NOTE — Progress Notes (Signed)
Cardiology Office Note  Date: 09/10/2021   ID: [name] [name], DOB [birth year], MRN [id number]  PCP:  Celene Squibb, MD  Cardiologist:  Rozann Lesches, MD Electrophysiologist:  None   Chief Complaint  Patient presents with   Cardiac follow-up    History of Present Illness: [name] [name] is an 81 y.o. male last seen in January.  He is here for a routine visit.  Still doing well without any reported angina symptoms or nitroglycerin use, NYHA class II dyspnea.  Follow-up lab work from February is outlined below.  Medical regimen is stable from a cardiac perspective and outlined below.  He does not report any bleeding problems on aspirin and Plavix.  Last LDL was 85 on current dose of Lipitor which she is tolerating without myalgias.  I personally reviewed his ECG today which shows sinus rhythm with old anterior infarct pattern.  Past Medical History:  Diagnosis Date   Age-related macular degeneration, dry, right eye    Arthritis    Coronary atherosclerosis of native coronary artery    PTCA/stent to LAD and diagonal 2001; DES to LAD, DES to RCA, DES x 2 to circumflex/OM1 07/2016   CVA (cerebral vascular accident) [facility]) 01/2011   Resolved dysarthria and right hemiparesis   CVA (cerebral vascular accident) ([place]) 06/2014   Drug intolerance    ACE inhibitor; related to cough    Essential hypertension    Hyperlipidemia    MI (myocardial infarction) ([place]) 2000   Partial small bowel obstruction ([place]) 12//2012   Resolved spontaneously.   Refusal of blood transfusions as patient is [name]     Past Surgical History:  Procedure Laterality Date   APPENDECTOMY     CATARACT EXTRACTION W/PHACO Right 05/14/2015   Procedure: CATARACT EXTRACTION PHACO AND INTRAOCULAR LENS PLACEMENT RIGHT EYE CDE=6.96;  Surgeon: Williams Che, MD;  Location: [facility];  Service: Ophthalmology;  Laterality: Right;   CATARACT EXTRACTION W/PHACO Left 05/2019   COLONOSCOPY W/ BIOPSIES AND  POLYPECTOMY  2005   CORONARY ANGIOPLASTY WITH STENT PLACEMENT  08/21/2016   CORONARY STENT INTERVENTION N/A 08/21/2016   Procedure: Coronary Stent Intervention;  Surgeon: Burnell Blanks, MD;  Location: [facility];  Service: Cardiovascular;  Laterality: N/A;   LEFT HEART CATH AND CORONARY ANGIOGRAPHY N/A 08/21/2016   Procedure: Left Heart Cath and Coronary Angiography;  Surgeon: Burnell Blanks, MD;  Location: [place] [facility];  Service: Cardiovascular;  Laterality: N/A;   TONSILLECTOMY      Current Outpatient Medications  Medication Sig Dispense Refill   aspirin 81 MG tablet Take 1 tablet (81 mg total) by mouth daily. 30 tablet 11   atorvastatin (LIPITOR) 40 MG tablet Take 40 mg by mouth daily.     Cholecalciferol (VITAMIN D3) 2000 units TABS Take 2,000 Units by mouth daily.     clopidogrel (PLAVIX) 75 MG tablet Take 75 mg by mouth daily.     nitroGLYCERIN (NITROSTAT) 0.4 MG SL tablet Place 1 tablet (0.4 mg total) under the tongue every 5 (five) minutes x 3 doses as needed (if no relief after 3rd dose, proceed to the ED for an evaluation or call 911). 25 tablet 3   polycarbophil (FIBERCON) 625 MG tablet Take 1,250 mg by mouth daily.     Polyethyl Glycol-Propyl Glycol (SYSTANE OP) Place 1 drop into both eyes 3 (three) times daily as needed (Dry Eyes).      telmisartan (MICARDIS) 80 MG tablet Take 80 mg by mouth  daily.      UNABLE TO FIND 1 Syringe by Intravitreal route See admin instructions. Patient receives eye injections every 6 to 8 weeks.     PROLENSA 0.07 % SOLN Place 1 drop into the left eye daily. (Patient not taking: Reported on 09/10/2021)     No current facility-administered medications for this visit.   Allergies:  Penicillins   ROS: No palpitations or syncope.  Physical Exam: VS:  BP (!) 142/70 (BP Location: Right Arm, Patient Position: Sitting, Cuff Size: Normal)   Pulse 67   Ht '5\' 9"'$  (1.753 m)   Wt 185 lb (83.9 kg)   SpO2 97%   BMI 27.32 kg/m ,  BMI Body mass index is 27.32 kg/m.  Wt Readings from Last 3 Encounters:  09/10/21 185 lb (83.9 kg)  03/13/21 185 lb 6.4 oz (84.1 kg)  08/28/20 191 lb (86.6 kg)    General: Patient appears comfortable at rest. HEENT: Conjunctiva and lids normal, oropharynx clear. Neck: Supple, no elevated JVP or carotid bruits, no thyromegaly. Lungs: Clear to auscultation, nonlabored breathing at rest. Cardiac: Regular rate and rhythm, no S3 or significant systolic murmur. Extremities: No pitting edema.  ECG:  An ECG dated 08/28/2020 was personally reviewed today and demonstrated:  Sinus rhythm with decreased R wave progression.  Recent Labwork:  February 2023: Hemoglobin A1c 6.2%, cholesterol 137, triglycerides 84, HDL 36, LDL 85, BUN 17, creatinine 0.81, potassium 4.6, AST 16, ALT 14, hemoglobin 14.7, platelets 249  Other Studies Reviewed Today:  Echocardiogram 07/30/2020:  1. Left ventricular ejection fraction, by estimation, is 55 to 60%. The  left ventricle has normal function. The left ventricle demonstrates  regional wall motion abnormalities (see scoring diagram/findings for  description). Left ventricular diastolic  parameters are indeterminate. Normal global longitudinal strain of -18.8%.   2. Right ventricular systolic function is normal. The right ventricular  size is normal. There is normal pulmonary artery systolic pressure. The  estimated right ventricular systolic pressure is 33.3 mmHg.   3. The mitral valve is grossly normal. Mild mitral valve regurgitation.   4. The aortic valve is tricuspid. Aortic valve regurgitation is not  visualized.   5. The inferior vena cava is normal in size with greater than 50%  respiratory variability, suggesting right atrial pressure of 3 mmHg.   Assessment and Plan:  1.  CAD status post DES to the LAD, RCA, and circumflex/OM territory as of 2018.  We are following him expectantly in the absence of angina symptoms on medical therapy.  ECG reviewed and  stable.  Continue aspirin, Plavix, Micardis, Lipitor, and as needed nitroglycerin.  2.  Mixed hyperlipidemia, tolerating current dose of Lipitor with last LDL 85.  No changes were made today.  Medication Adjustments/Labs and Tests Ordered: Current medicines are reviewed at length with the patient today.  Concerns regarding medicines are outlined above.   Tests Ordered: Orders Placed This Encounter  Procedures   EKG 12-Lead    Medication Changes: No orders of the defined types were placed in this encounter.   Disposition:  Follow up  6 months.  Signed, Satira Sark, MD, [facility] 09/10/2021 2:08 PM    [facility] [place], [place], [place] [address] Phone: [phone]; Fax: [phone]

## 2021-10-16 ENCOUNTER — Encounter (INDEPENDENT_AMBULATORY_CARE_PROVIDER_SITE_OTHER): Payer: PPO | Admitting: Ophthalmology

## 2021-10-21 ENCOUNTER — Ambulatory Visit (INDEPENDENT_AMBULATORY_CARE_PROVIDER_SITE_OTHER): Payer: PPO | Admitting: Ophthalmology

## 2021-10-21 ENCOUNTER — Encounter (INDEPENDENT_AMBULATORY_CARE_PROVIDER_SITE_OTHER): Payer: Self-pay | Admitting: Ophthalmology

## 2021-10-21 DIAGNOSIS — H43311 Vitreous membranes and strands, right eye: Secondary | ICD-10-CM

## 2021-10-21 DIAGNOSIS — H353122 Nonexudative age-related macular degeneration, left eye, intermediate dry stage: Secondary | ICD-10-CM

## 2021-10-21 DIAGNOSIS — H35372 Puckering of macula, left eye: Secondary | ICD-10-CM

## 2021-10-21 DIAGNOSIS — H353211 Exudative age-related macular degeneration, right eye, with active choroidal neovascularization: Secondary | ICD-10-CM

## 2021-10-21 MED ORDER — BEVACIZUMAB CHEMO INJECTION 1.25MG/0.05ML SYRINGE FOR KALEIDOSCOPE
1.2500 mg | INTRAVITREAL | Status: AC | PRN
  Administered 2021-10-21: 1.25 mg via INTRAVITREAL

## 2021-10-21 NOTE — Assessment & Plan Note (Signed)
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  Repeat injection today and extend interval examination next to 10 weeks

## 2021-10-21 NOTE — Assessment & Plan Note (Signed)
No sign of CNVM OS 

## 2021-10-21 NOTE — Progress Notes (Signed)
10/21/2021     CHIEF COMPLAINT Patient presents for  Chief Complaint  Patient presents with   Macular Degeneration      HISTORY OF PRESENT ILLNESS: [name] [name] is a 81 y.o. male who presents to the clinic today for:   HPI   7 weeks dilate od avastin oct, with past history of peripapillary CNVM and subretinal fluid.  Stable acuity per patient. Pt states his vision has not been stable Pt admits to new floaters in his right eye, No FOL   Last edited by Hurman Horn, MD on 10/21/2021 11:12 AM.      Referring physician: Celene Squibb, MD [address],  [place] [address]  HISTORICAL INFORMATION:   Selected notes from the MEDICAL RECORD NUMBER    Lab Results  Component Value Date   HGBA1C 6.0 (H) 07/14/2014     CURRENT MEDICATIONS: Current Outpatient Medications (Ophthalmic Drugs)  Medication Sig   Polyethyl Glycol-Propyl Glycol (SYSTANE OP) Place 1 drop into both eyes 3 (three) times daily as needed (Dry Eyes).    PROLENSA 0.07 % SOLN Place 1 drop into the left eye daily. (Patient not taking: Reported on 09/10/2021)   No current facility-administered medications for this visit. (Ophthalmic Drugs)   Current Outpatient Medications (Other)  Medication Sig   aspirin 81 MG tablet Take 1 tablet (81 mg total) by mouth daily.   atorvastatin (LIPITOR) 40 MG tablet Take 40 mg by mouth daily.   Cholecalciferol (VITAMIN D3) 2000 units TABS Take 2,000 Units by mouth daily.   clopidogrel (PLAVIX) 75 MG tablet Take 75 mg by mouth daily.   nitroGLYCERIN (NITROSTAT) 0.4 MG SL tablet Place 1 tablet (0.4 mg total) under the tongue every 5 (five) minutes x 3 doses as needed (if no relief after 3rd dose, proceed to the ED for an evaluation or call 911).   polycarbophil (FIBERCON) 625 MG tablet Take 1,250 mg by mouth daily.   telmisartan (MICARDIS) 80 MG tablet Take 80 mg by mouth daily.    UNABLE TO FIND 1 Syringe by Intravitreal route See admin instructions. Patient  receives eye injections every 6 to 8 weeks.   No current facility-administered medications for this visit. (Other)      REVIEW OF SYSTEMS: ROS   Negative for: Constitutional, Gastrointestinal, Neurological, Skin, Genitourinary, Musculoskeletal, HENT, Endocrine, Cardiovascular, Eyes, Respiratory, Psychiatric, Allergic/Imm, Heme/Lymph Last edited by Morene Rankins, CMA on 10/21/2021 10:26 AM.       ALLERGIES Allergies  Allergen Reactions   Penicillins Nausea Only    Has patient had a PCN reaction causing immediate rash, facial/tongue/throat swelling, SOB or lightheadedness with hypotension: No Has patient had a PCN reaction causing severe rash involving mucus membranes or skin necrosis: No Has patient had a PCN reaction that required hospitalization No Has patient had a PCN reaction occurring within the last 10 years: No If all of the above answers are "NO", then may proceed with Cephalosporin use.     PAST MEDICAL HISTORY Past Medical History:  Diagnosis Date   Age-related macular degeneration, dry, right eye    Arthritis    Coronary atherosclerosis of native coronary artery    PTCA/stent to LAD and diagonal 2001; DES to LAD, DES to RCA, DES x 2 to circumflex/OM1 07/2016   CVA (cerebral vascular accident) [facility]) 01/2011   Resolved dysarthria and right hemiparesis   CVA (cerebral vascular accident) ([place]) 06/2014   Drug intolerance    ACE inhibitor; related to  cough    Essential hypertension    Hyperlipidemia    MI (myocardial infarction) ([place]) 2000   Partial small bowel obstruction ([place]) 12//2012   Resolved spontaneously.   Refusal of blood transfusions as patient is [name]    Past Surgical History:  Procedure Laterality Date   APPENDECTOMY     CATARACT EXTRACTION W/PHACO Right 05/14/2015   Procedure: CATARACT EXTRACTION PHACO AND INTRAOCULAR LENS PLACEMENT RIGHT EYE CDE=6.96;  Surgeon: Williams Che, MD;  Location: [facility];  Service: Ophthalmology;   Laterality: Right;   CATARACT EXTRACTION W/PHACO Left 05/2019   COLONOSCOPY W/ BIOPSIES AND POLYPECTOMY  2005   CORONARY ANGIOPLASTY WITH STENT PLACEMENT  08/21/2016   CORONARY STENT INTERVENTION N/A 08/21/2016   Procedure: Coronary Stent Intervention;  Surgeon: Burnell Blanks, MD;  Location: [facility];  Service: Cardiovascular;  Laterality: N/A;   LEFT HEART CATH AND CORONARY ANGIOGRAPHY N/A 08/21/2016   Procedure: Left Heart Cath and Coronary Angiography;  Surgeon: Burnell Blanks, MD;  Location: [place] [facility];  Service: Cardiovascular;  Laterality: N/A;   TONSILLECTOMY      FAMILY HISTORY Family History  Problem Relation Age of Onset   Cancer Father    Heart failure Father    Hyperlipidemia Father    Diabetes Sister    Hypertension Sister    Cancer Mother    Cancer Brother    Cancer Sister     SOCIAL HISTORY Social History   Tobacco Use   Smoking status: Former    Packs/day: 1.00    Years: 4.00    Total pack years: 4.00    Types: Cigarettes    Quit date: 10/22/1958    Years since quitting: 63.0   Smokeless tobacco: Never  Vaping Use   Vaping Use: Never used  Substance Use Topics   Alcohol use: No    Alcohol/week: 0.0 standard drinks of alcohol   Drug use: No         OPHTHALMIC EXAM:  Base Eye Exam     Visual Acuity (ETDRS)       Right Left   Dist [place] 20/80 20/60         Tonometry (Tonopen, 10:31 AM)       Right Left   Pressure 10 8         Extraocular Movement       Right Left    Ortho Ortho    -- -- --  --  --  -- -- --   -- -- --  --  --  -- -- --           Neuro/Psych     Oriented x3: Yes   Mood/Affect: Normal         Dilation     Right eye: 2.5% Phenylephrine, 1.0% Mydriacyl @ 10:27 AM           Slit Lamp and Fundus Exam     External Exam       Right Left   External Normal Normal         Slit Lamp Exam       Right Left   Lids/Lashes Normal Normal   Conjunctiva/Sclera  White and quiet White and quiet   Cornea Clear Clear   Anterior Chamber Deep and quiet Deep and quiet   Iris Round and reactive Round and reactive   Lens Centered posterior chamber intraocular lens Centered posterior chamber intraocular lens   Anterior Vitreous Normal Normal  Fundus Exam       Right Left   Posterior Vitreous Normal    Disc Normal    C/D Ratio 0.0    Macula Macular thickening, nasal to FAZ, subretinal neovascular membrane less active, Hard drusen    Vessels Normal    Periphery Normal             IMAGING AND PROCEDURES  Imaging and Procedures for 10/21/21  OCT, Retina - OU - Both Eyes       Right Eye Quality was good. Scan locations included subfoveal. Central Foveal Thickness: 361. Progression has been stable. Findings include abnormal foveal contour, pigment epithelial detachment, subretinal fluid.   Left Eye Quality was good. Scan locations included subfoveal. Central Foveal Thickness: 431. Progression has been stable. Findings include abnormal foveal contour.   Notes Subretinal fluid overlying pigment epithelial detachment vascularized, nasal to the fovea OD, decreased activity.  Post most recent use of Avastin currently at 8-week follow-up.  We will repeat intravitreal Avastin OD today   Post epiretinal membrane peel, much less thickening centrally.,  At 7 months post removal of ERM     Intravitreal Injection, Pharmacologic Agent - OD - Right Eye       Time Out 10/21/2021. 11:11 AM. Confirmed correct patient, procedure, site, and patient consented.   Anesthesia Topical anesthesia was used. Anesthetic medications included Lidocaine 4%.   Procedure Preparation included 5% betadine to ocular surface, 10% betadine to eyelids, Tobramycin 0.3%. A 30 gauge needle was used.   Injection: 1.25 mg Bevacizumab 1.'25mg'$ /0.63m   Route: Intravitreal, Site: Right Eye   NDC: [id number] Lot:: [id number] Expiration date: 01/08/2022   Post-op Post  injection exam found visual acuity of at least counting fingers. The patient tolerated the procedure well. There were no complications. The patient received written and verbal post procedure care education. Post injection medications included ocuflox.              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  Repeat injection today and extend interval examination next to 10 weeks  Pathologic vitreous membrane, right Stable overall  Intermediate stage nonexudative age-related macular degeneration of left eye No sign of CNVM OS     ICD-10-CM   1. Exudative age-related macular degeneration of right eye with active choroidal neovascularization (HCC)  H35.3211 OCT, Retina - OU - Both Eyes    Intravitreal Injection, Pharmacologic Agent - OD - Right Eye    Bevacizumab (AVASTIN) SOLN 1.25 mg    2. Left epiretinal membrane  H35.372     3. Pathologic vitreous membrane, right  H43.311     4. Intermediate stage nonexudative age-related macular degeneration of left eye  H35.3122       1.  Repeat injection intravitreal Avastin OD today at 8 weeks.  Follow-up next in 10 weeks  2.  OS improving macular thickening post vitrectomy membrane peel for ERM  3.  Ophthalmic Meds Ordered this visit:  Meds ordered this encounter  Medications   Bevacizumab (AVASTIN) SOLN 1.25 mg       Return in about 10 weeks (around 12/30/2021) for dilate, OD, AVASTIN OCT.  There are no Patient Instructions on file for this visit.   Explained the diagnoses, plan, and follow up with the patient and they expressed understanding.  Patient expressed understanding of the importance of proper follow  up care.   Clent Demark Shina Wass M.D. Diseases & Surgery of the Retina and Vitreous [facility] [place] 10/21/21     Abbreviations: M myopia (nearsighted); A astigmatism; H hyperopia (farsighted); P presbyopia; Mrx spectacle prescription;  CTL contact lenses; OD right eye; OS left eye; OU both eyes  XT exotropia; ET esotropia; PEK punctate epithelial keratitis; PEE punctate epithelial erosions; DES dry eye syndrome; MGD meibomian gland dysfunction; ATs artificial tears; PFAT's preservative free artificial tears; [place] nuclear sclerotic cataract; PSC posterior subcapsular cataract; ERM epi-retinal membrane; PVD posterior vitreous detachment; RD retinal detachment; DM diabetes mellitus; DR diabetic retinopathy; NPDR non-proliferative diabetic retinopathy; PDR proliferative diabetic retinopathy; CSME clinically significant macular edema; DME diabetic macular edema; dbh dot blot hemorrhages; CWS cotton wool spot; POAG primary open angle glaucoma; C/D cup-to-disc ratio; HVF humphrey visual field; GVF goldmann visual field; OCT optical coherence tomography; IOP intraocular pressure; BRVO Branch retinal vein occlusion; CRVO central retinal vein occlusion; CRAO central retinal artery occlusion; BRAO branch retinal artery occlusion; RT retinal tear; SB scleral buckle; PPV pars plana vitrectomy; VH Vitreous hemorrhage; PRP panretinal laser photocoagulation; IVK intravitreal kenalog; VMT vitreomacular traction; MH Macular hole;  NVD neovascularization of the disc; NVE neovascularization elsewhere; AREDS age related eye disease study; ARMD age related macular degeneration; POAG primary open angle glaucoma; EBMD epithelial/anterior basement membrane dystrophy; ACIOL anterior chamber intraocular lens; IOL intraocular lens; PCIOL posterior chamber intraocular lens; Phaco/IOL phacoemulsification with intraocular lens placement; [place] photorefractive keratectomy; LASIK laser assisted in situ keratomileusis; HTN hypertension; DM diabetes mellitus; COPD chronic obstructive pulmonary disease

## 2021-10-21 NOTE — Assessment & Plan Note (Signed)
Now at 6 months post vitrectomy , improved

## 2021-10-21 NOTE — Assessment & Plan Note (Signed)
Stable overall.  

## 2021-10-22 DIAGNOSIS — R7303 Prediabetes: Secondary | ICD-10-CM | POA: Diagnosis not present

## 2021-10-22 DIAGNOSIS — E782 Mixed hyperlipidemia: Secondary | ICD-10-CM | POA: Diagnosis not present

## 2021-10-22 DIAGNOSIS — Z125 Encounter for screening for malignant neoplasm of prostate: Secondary | ICD-10-CM | POA: Diagnosis not present

## 2021-10-29 DIAGNOSIS — H3530 Unspecified macular degeneration: Secondary | ICD-10-CM | POA: Diagnosis not present

## 2021-10-29 DIAGNOSIS — Z0001 Encounter for general adult medical examination with abnormal findings: Secondary | ICD-10-CM | POA: Diagnosis not present

## 2021-10-29 DIAGNOSIS — R7303 Prediabetes: Secondary | ICD-10-CM | POA: Diagnosis not present

## 2021-10-29 DIAGNOSIS — E782 Mixed hyperlipidemia: Secondary | ICD-10-CM | POA: Diagnosis not present

## 2021-10-29 DIAGNOSIS — I2510 Atherosclerotic heart disease of native coronary artery without angina pectoris: Secondary | ICD-10-CM | POA: Diagnosis not present

## 2021-10-29 DIAGNOSIS — R1310 Dysphagia, unspecified: Secondary | ICD-10-CM | POA: Diagnosis not present

## 2021-10-29 DIAGNOSIS — I10 Essential (primary) hypertension: Secondary | ICD-10-CM | POA: Diagnosis not present

## 2021-10-29 DIAGNOSIS — Z6827 Body mass index (BMI) 27.0-27.9, adult: Secondary | ICD-10-CM | POA: Diagnosis not present

## 2021-10-30 ENCOUNTER — Encounter: Payer: Self-pay | Admitting: Internal Medicine

## 2021-11-26 DIAGNOSIS — Z23 Encounter for immunization: Secondary | ICD-10-CM | POA: Diagnosis not present

## 2021-12-17 DIAGNOSIS — Z961 Presence of intraocular lens: Secondary | ICD-10-CM | POA: Diagnosis not present

## 2021-12-17 DIAGNOSIS — H353122 Nonexudative age-related macular degeneration, left eye, intermediate dry stage: Secondary | ICD-10-CM | POA: Diagnosis not present

## 2021-12-17 DIAGNOSIS — H3581 Retinal edema: Secondary | ICD-10-CM | POA: Diagnosis not present

## 2021-12-17 DIAGNOSIS — H353211 Exudative age-related macular degeneration, right eye, with active choroidal neovascularization: Secondary | ICD-10-CM | POA: Diagnosis not present

## 2021-12-17 DIAGNOSIS — H35373 Puckering of macula, bilateral: Secondary | ICD-10-CM | POA: Diagnosis not present

## 2021-12-30 ENCOUNTER — Encounter (INDEPENDENT_AMBULATORY_CARE_PROVIDER_SITE_OTHER): Payer: PPO | Admitting: Ophthalmology

## 2021-12-30 DIAGNOSIS — H353122 Nonexudative age-related macular degeneration, left eye, intermediate dry stage: Secondary | ICD-10-CM | POA: Diagnosis not present

## 2021-12-30 DIAGNOSIS — H35373 Puckering of macula, bilateral: Secondary | ICD-10-CM | POA: Diagnosis not present

## 2021-12-30 DIAGNOSIS — H353211 Exudative age-related macular degeneration, right eye, with active choroidal neovascularization: Secondary | ICD-10-CM | POA: Diagnosis not present

## 2021-12-30 DIAGNOSIS — H43311 Vitreous membranes and strands, right eye: Secondary | ICD-10-CM | POA: Diagnosis not present

## 2022-03-13 ENCOUNTER — Ambulatory Visit: Payer: PPO | Attending: Cardiology | Admitting: Cardiology

## 2022-03-13 ENCOUNTER — Encounter: Payer: Self-pay | Admitting: Cardiology

## 2022-03-13 VITALS — BP 130/76 | HR 54 | Ht 69.0 in | Wt 185.0 lb

## 2022-03-13 DIAGNOSIS — E782 Mixed hyperlipidemia: Secondary | ICD-10-CM

## 2022-03-13 DIAGNOSIS — I25119 Atherosclerotic heart disease of native coronary artery with unspecified angina pectoris: Secondary | ICD-10-CM

## 2022-03-13 NOTE — Patient Instructions (Addendum)

## 2022-03-13 NOTE — Progress Notes (Signed)
Cardiology Office Note  Date: 03/13/2022   ID: [name] [name], DOB [birth year], MRN [id number]  PCP:  Celene Squibb, MD  Cardiologist:  Rozann Lesches, MD Electrophysiologist:  None   Chief Complaint  Patient presents with   Cardiac follow-up    History of Present Illness: [name] [name] is an 82 y.o. male last seen in July 2023.  He is here for a routine visit.  He does not describe any angina or nitroglycerin use in the interim.  Reports compliance with his medications.  He had interval lab work, LDL 72 on Lipitor.  I rechecked his blood pressure today in the left arm at 130/76.  He continues to follow with Dr. Nevada Crane for primary care.  His last echocardiogram in June 2022 demonstrated normal LVEF at 55 to 60%.  Past Medical History:  Diagnosis Date   Age-related macular degeneration, dry, right eye    Arthritis    Coronary atherosclerosis of native coronary artery    PTCA/stent to LAD and diagonal 2001; DES to LAD, DES to RCA, DES x 2 to circumflex/OM1 07/2016   CVA (cerebral vascular accident) [facility]) 01/2011   Resolved dysarthria and right hemiparesis   CVA (cerebral vascular accident) ([place]) 06/2014   Drug intolerance    ACE inhibitor; related to cough    Essential hypertension    Hyperlipidemia    MI (myocardial infarction) ([place]) 2000   Partial small bowel obstruction ([place]) 12//2012   Resolved spontaneously.   Refusal of blood transfusions as patient is [name]     Current Outpatient Medications  Medication Sig Dispense Refill   aspirin 81 MG tablet Take 1 tablet (81 mg total) by mouth daily. 30 tablet 11   atorvastatin (LIPITOR) 40 MG tablet Take 40 mg by mouth daily.     Cholecalciferol (VITAMIN D3) 2000 units TABS Take 2,000 Units by mouth daily.     clopidogrel (PLAVIX) 75 MG tablet Take 75 mg by mouth daily.     nitroGLYCERIN (NITROSTAT) 0.4 MG SL tablet Place 1 tablet (0.4 mg total) under the tongue every 5 (five) minutes x 3 doses as needed (if no  relief after 3rd dose, proceed to the ED for an evaluation or call 911). 25 tablet 3   polycarbophil (FIBERCON) 625 MG tablet Take 1,250 mg by mouth daily.     Polyethyl Glycol-Propyl Glycol (SYSTANE OP) Place 1 drop into both eyes 3 (three) times daily as needed (Dry Eyes).      PROLENSA 0.07 % SOLN Place 1 drop into the left eye daily.     telmisartan (MICARDIS) 80 MG tablet Take 80 mg by mouth daily.      UNABLE TO FIND 1 Syringe by Intravitreal route See admin instructions. Patient receives eye injections every 6 to 8 weeks.     No current facility-administered medications for this visit.   Allergies:  Penicillins   ROS: No orthopnea or PND.  No syncope.  Physical Exam: VS:  BP 130/76   Pulse (!) 54   Ht '5\' 9"'$  (1.753 m)   Wt 185 lb (83.9 kg)   SpO2 97%   BMI 27.32 kg/m , BMI Body mass index is 27.32 kg/m.  Wt Readings from Last 3 Encounters:  03/13/22 185 lb (83.9 kg)  09/10/21 185 lb (83.9 kg)  03/13/21 185 lb 6.4 oz (84.1 kg)    General: Patient appears comfortable at rest. HEENT: Conjunctiva and lids normal. Neck: Supple, no elevated JVP or carotid bruits. Lungs: Clear  to auscultation, nonlabored breathing at rest. Cardiac: Regular rate and rhythm, no S3 or significant systolic murmur. Extremities: No pitting edema.  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  Recent Labwork:  August 2023: Hemoglobin 14.1, platelets 241, BUN 14, creatinine 0.91, potassium 4.5, AST 16, ALT 13, cholesterol 128, triglycerides 94, HDL 38, LDL 72, hemoglobin A1c 6.4%  Other Studies Reviewed Today:  Echocardiogram 07/30/2020:  1. Left ventricular ejection fraction, by estimation, is 55 to 60%. The  left ventricle has normal function. The left ventricle demonstrates  regional wall motion abnormalities (see scoring diagram/findings for  description). Left ventricular diastolic  parameters are indeterminate.  Normal global longitudinal strain of -18.8%.   2. Right ventricular systolic function is normal. The right ventricular  size is normal. There is normal pulmonary artery systolic pressure. The  estimated right ventricular systolic pressure is 58.5 mmHg.   3. The mitral valve is grossly normal. Mild mitral valve regurgitation.   4. The aortic valve is tricuspid. Aortic valve regurgitation is not  visualized.   5. The inferior vena cava is normal in size with greater than 50%  respiratory variability, suggesting right atrial pressure of 3 mmHg.   Assessment and Plan:  1.  CAD status post DES to the LAD, RCA, and circumflex/OM territory in 2018.  He remains stable without angina or nitroglycerin use and we continue observation on medical therapy.  Continue aspirin, Plavix, Lipitor, Micardis, and as needed nitroglycerin.  2.  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  Last LDL 72 which has decreased.  No changes were made today.  Medication Adjustments/Labs and Tests Ordered: Current medicines are reviewed at length with the patient today.  Concerns regarding medicines are outlined above.   Tests Ordered: No orders of the defined types were placed in this encounter.   Medication Changes: No orders of the defined types were placed in this encounter.   Disposition:  Follow up  6 months.  Signed, Satira Sark, MD, [facility] 03/13/2022 4:06 PM    [facility] [place], [place], [place] [address] Phone: [phone]; Fax: [phone]

## 2022-03-17 DIAGNOSIS — H35371 Puckering of macula, right eye: Secondary | ICD-10-CM | POA: Diagnosis not present

## 2022-03-17 DIAGNOSIS — H43311 Vitreous membranes and strands, right eye: Secondary | ICD-10-CM | POA: Diagnosis not present

## 2022-03-17 DIAGNOSIS — H353122 Nonexudative age-related macular degeneration, left eye, intermediate dry stage: Secondary | ICD-10-CM | POA: Diagnosis not present

## 2022-03-17 DIAGNOSIS — H35372 Puckering of macula, left eye: Secondary | ICD-10-CM | POA: Diagnosis not present

## 2022-03-17 DIAGNOSIS — H353211 Exudative age-related macular degeneration, right eye, with active choroidal neovascularization: Secondary | ICD-10-CM | POA: Diagnosis not present

## 2022-03-25 IMAGING — RF DG SWALLOWING FUNCTION
12 of 19 series · 12 of 24 positions shown · non-contrast
Comparison: Esophagram 09/18/2020

CLINICAL DATA: Dysphagia. Aspiration on esophagram

EXAM:
MODIFIED BARIUM SWALLOW
TECHNIQUE: Different consistencies of barium were administered orally to the
patient by the Speech Pathologist. Imaging of the pharynx was
performed in the lateral projection. The radiologist was present in
the fluoroscopy room for this study, providing personal supervision.
FLUOROSCOPY TIME:  Fluoroscopy Time:  4 minutes 36 seconds
Radiation Exposure Index (if provided by the fluoroscopic device):
45.3 mGy
Number of Acquired Spot Images: multiple fluoroscopic screen
captures

[Series 1: before po · 1 of 158 frames shown]
[frame 135/158]
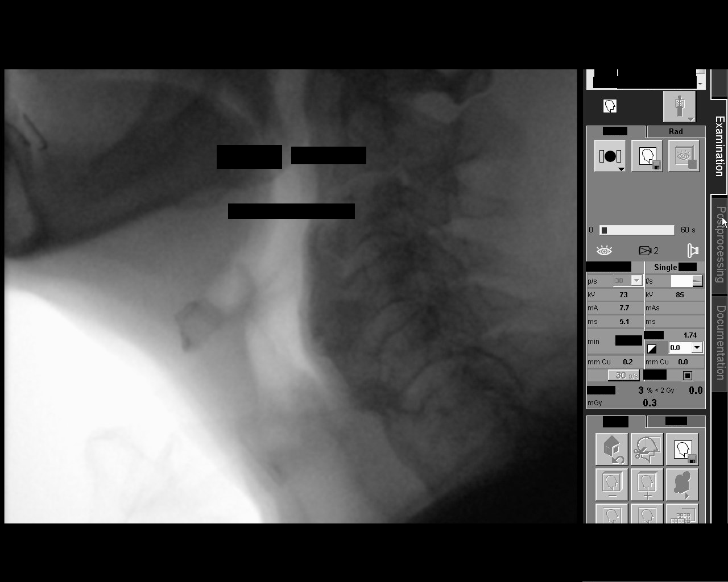

[Series 3: tsp thin 2 · 1 of 161 frames shown]
[frame 137/161]
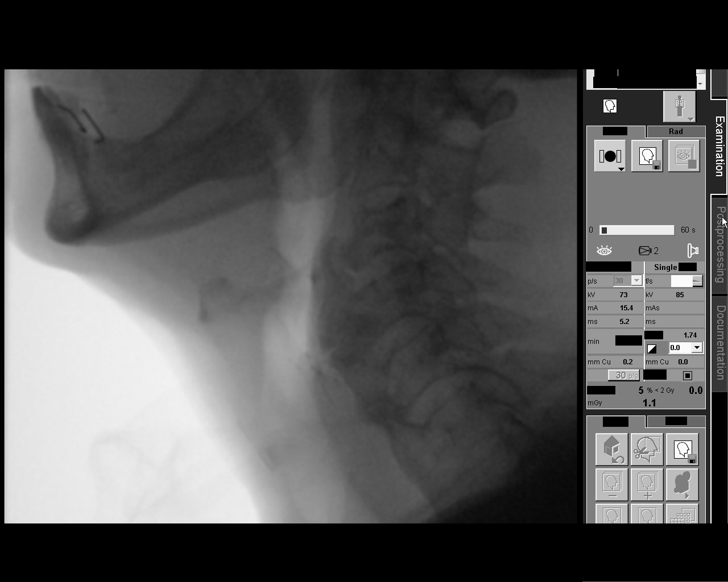

[Series 5: small cup sip thin · 1 of 371 frames shown]
[frame 186/371]
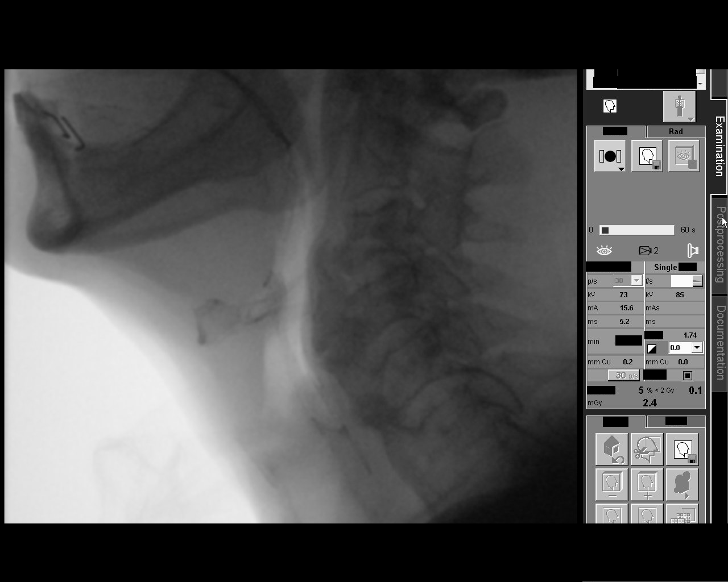

[Series 7: puree 1, then head r and l · 1 of 840 frames shown]
[frame 127/840]
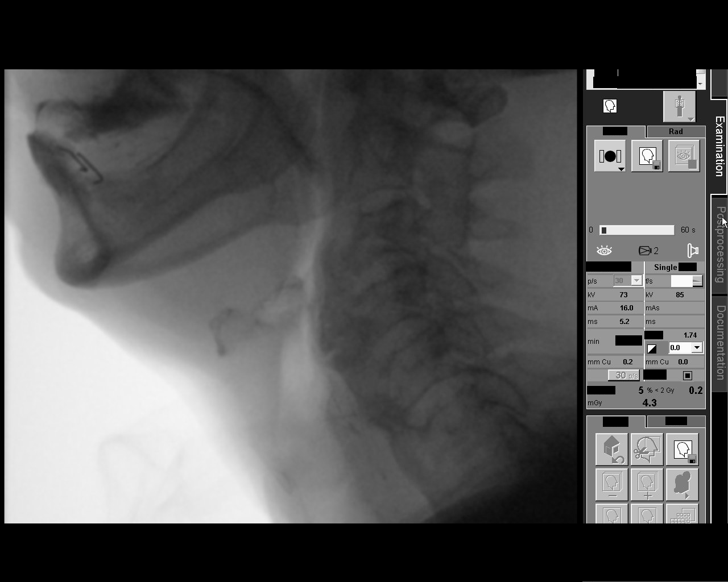

[Series 9: head l puree · 1 of 436 frames shown (1 of 2)]
[frame 66/436]
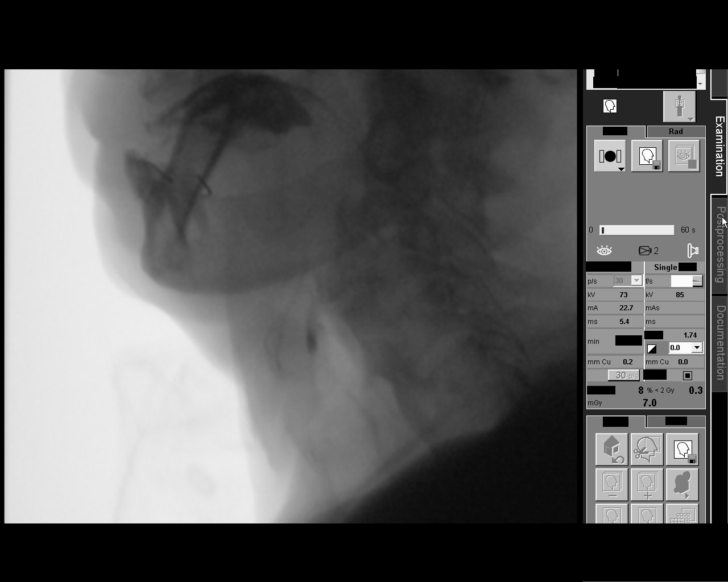

[Series 10: head r puree · 1 of 369 frames shown]
[frame 314/369]
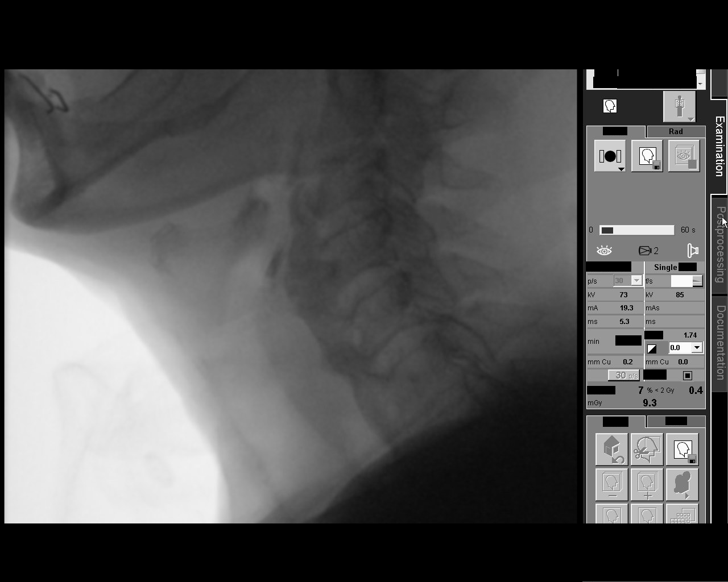

[Series 12: head l puree · 1 of 667 frames shown (2 of 2)]
[frame 334/667]
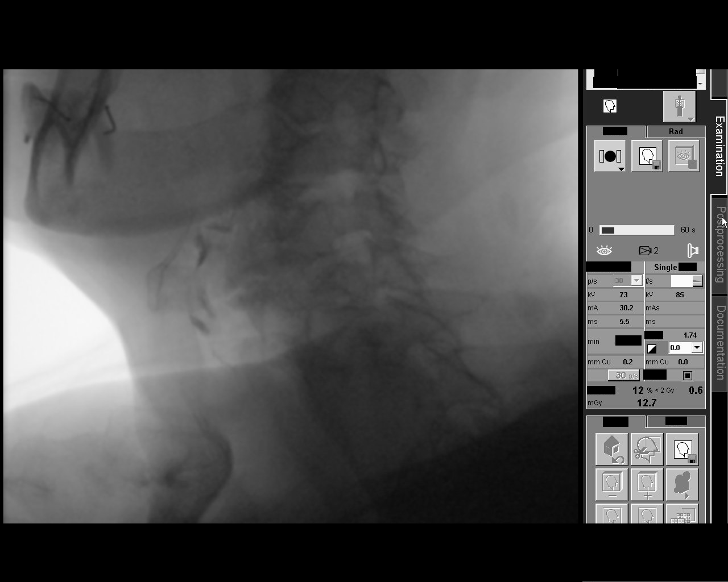

[Series 14: regular textures · 1 of 706 frames shown]
[frame 106/706]
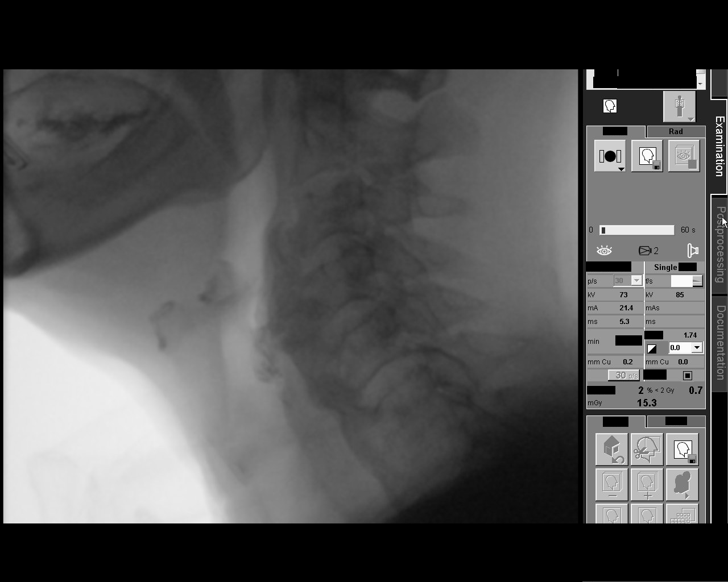

[Series 15: pill with cup thin · 1 of 467 frames shown]
[frame 427/467]
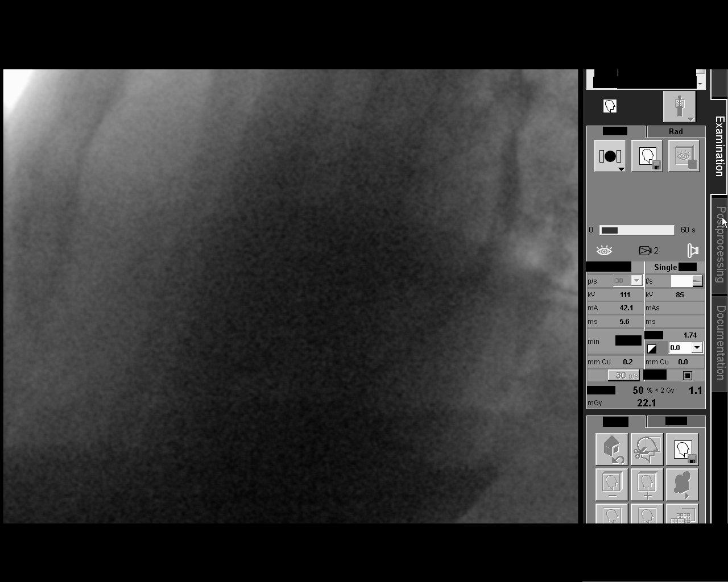

[Series 17: puree ap · 1 of 628 frames shown]
[frame 315/628]
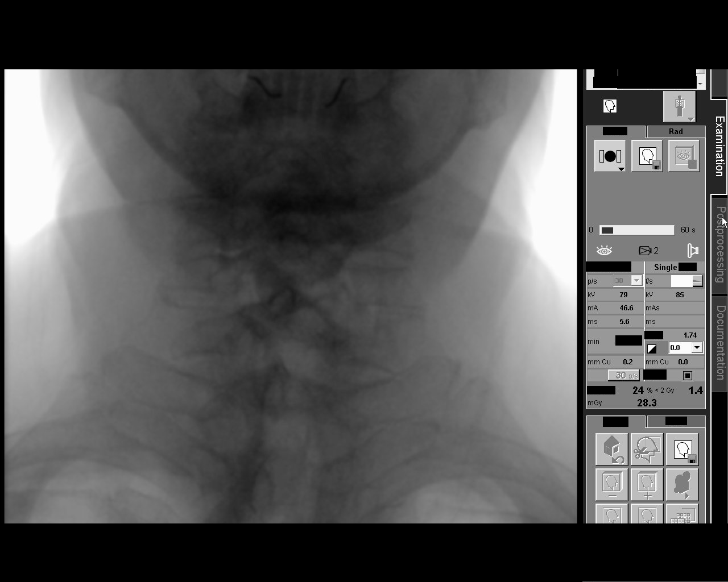

[Series 19: head l puree ap · 1 of 426 frames shown]
[frame 64/426]
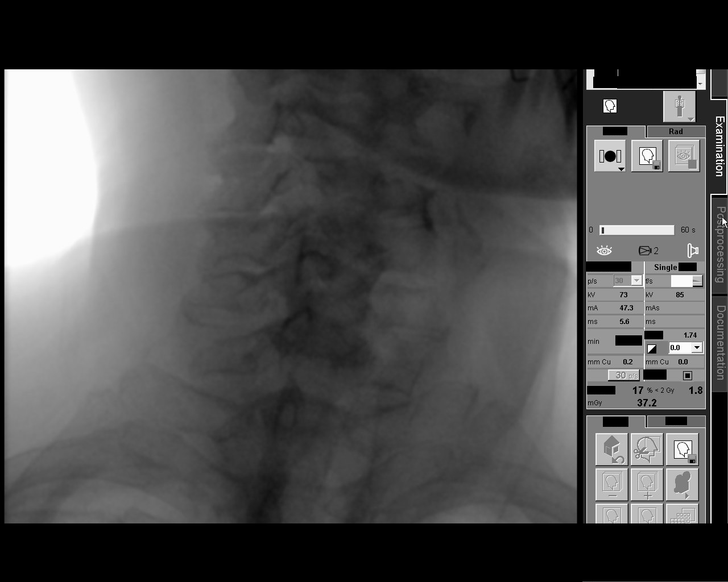

[Series 20: puree ap, head neutral · 1 of 382 frames shown]
[frame 325/382]
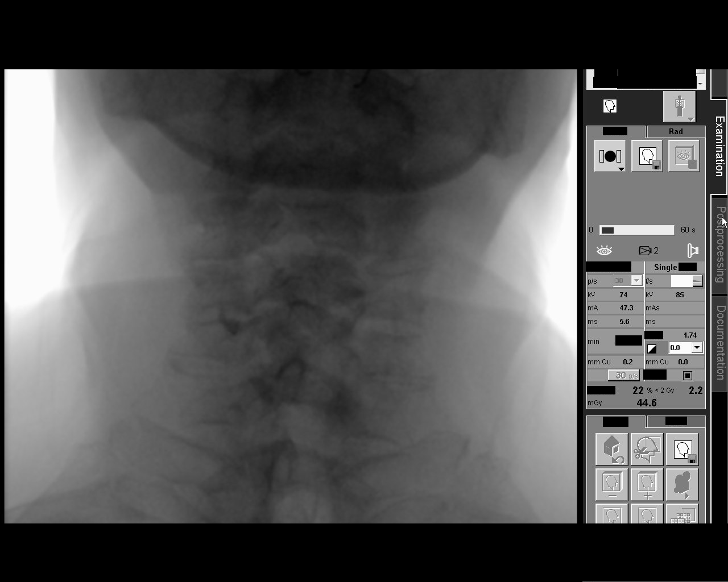

[12 of 24 positions shown; findings below may reference images not displayed]

FINDINGS: Bulky spurs and thick calcification of the anterior longitudinal
ligament at the mid to caudal cervical spine, impinging upon
posterior hypopharyngeal wall.

With swallows of thin barium, small amount of piriform residual is
identified. No laryngeal penetration or aspiration. Without pulse ox
consistency, residual hours are seen within the piriform sinuses and
posterior hypopharynx. More residuals are identified with head
turned to the LEFT and RIGHT, especially increased RIGHT-sided
residuals. Patient swallowed a 12.5 mm diameter barium tablet with
thin barium without difficulty. Tablet passed to the stomach without
obstruction. With cracker consistency mild vallecular and piriform
sinus residuals were seen. No laryngeal penetration or aspiration of
any assess consistency.
IMPRESSION: No laryngeal penetration or aspiration.

Scattered residuals as above.

Please refer to the Speech Pathologists report for complete details
and recommendations.

## 2022-04-22 DIAGNOSIS — E782 Mixed hyperlipidemia: Secondary | ICD-10-CM | POA: Diagnosis not present

## 2022-04-22 DIAGNOSIS — R7303 Prediabetes: Secondary | ICD-10-CM | POA: Diagnosis not present

## 2022-04-29 ENCOUNTER — Encounter: Payer: Self-pay | Admitting: Internal Medicine

## 2022-04-29 DIAGNOSIS — R1310 Dysphagia, unspecified: Secondary | ICD-10-CM | POA: Diagnosis not present

## 2022-04-29 DIAGNOSIS — I10 Essential (primary) hypertension: Secondary | ICD-10-CM | POA: Diagnosis not present

## 2022-04-29 DIAGNOSIS — Z6827 Body mass index (BMI) 27.0-27.9, adult: Secondary | ICD-10-CM | POA: Diagnosis not present

## 2022-04-29 DIAGNOSIS — R7303 Prediabetes: Secondary | ICD-10-CM | POA: Diagnosis not present

## 2022-04-29 DIAGNOSIS — Z79899 Other long term (current) drug therapy: Secondary | ICD-10-CM | POA: Diagnosis not present

## 2022-04-29 DIAGNOSIS — I2510 Atherosclerotic heart disease of native coronary artery without angina pectoris: Secondary | ICD-10-CM | POA: Diagnosis not present

## 2022-04-29 DIAGNOSIS — H3530 Unspecified macular degeneration: Secondary | ICD-10-CM | POA: Diagnosis not present

## 2022-04-29 DIAGNOSIS — Z713 Dietary counseling and surveillance: Secondary | ICD-10-CM | POA: Diagnosis not present

## 2022-04-29 DIAGNOSIS — E782 Mixed hyperlipidemia: Secondary | ICD-10-CM | POA: Diagnosis not present

## 2022-05-12 DIAGNOSIS — H3562 Retinal hemorrhage, left eye: Secondary | ICD-10-CM | POA: Diagnosis not present

## 2022-05-12 DIAGNOSIS — H35371 Puckering of macula, right eye: Secondary | ICD-10-CM | POA: Diagnosis not present

## 2022-05-12 DIAGNOSIS — H43311 Vitreous membranes and strands, right eye: Secondary | ICD-10-CM | POA: Diagnosis not present

## 2022-05-12 DIAGNOSIS — H35372 Puckering of macula, left eye: Secondary | ICD-10-CM | POA: Diagnosis not present

## 2022-05-12 DIAGNOSIS — H353122 Nonexudative age-related macular degeneration, left eye, intermediate dry stage: Secondary | ICD-10-CM | POA: Diagnosis not present

## 2022-05-12 DIAGNOSIS — H353211 Exudative age-related macular degeneration, right eye, with active choroidal neovascularization: Secondary | ICD-10-CM | POA: Diagnosis not present

## 2022-06-23 DIAGNOSIS — H43311 Vitreous membranes and strands, right eye: Secondary | ICD-10-CM | POA: Diagnosis not present

## 2022-06-23 DIAGNOSIS — H353122 Nonexudative age-related macular degeneration, left eye, intermediate dry stage: Secondary | ICD-10-CM | POA: Diagnosis not present

## 2022-06-23 DIAGNOSIS — H353211 Exudative age-related macular degeneration, right eye, with active choroidal neovascularization: Secondary | ICD-10-CM | POA: Diagnosis not present

## 2022-06-23 DIAGNOSIS — H3561 Retinal hemorrhage, right eye: Secondary | ICD-10-CM | POA: Diagnosis not present

## 2022-06-23 DIAGNOSIS — H35373 Puckering of macula, bilateral: Secondary | ICD-10-CM | POA: Diagnosis not present

## 2022-06-30 DIAGNOSIS — X32XXXD Exposure to sunlight, subsequent encounter: Secondary | ICD-10-CM | POA: Diagnosis not present

## 2022-06-30 DIAGNOSIS — H61002 Unspecified perichondritis of left external ear: Secondary | ICD-10-CM | POA: Diagnosis not present

## 2022-06-30 DIAGNOSIS — L570 Actinic keratosis: Secondary | ICD-10-CM | POA: Diagnosis not present

## 2022-07-29 DIAGNOSIS — H43311 Vitreous membranes and strands, right eye: Secondary | ICD-10-CM | POA: Diagnosis not present

## 2022-07-29 DIAGNOSIS — H35373 Puckering of macula, bilateral: Secondary | ICD-10-CM | POA: Diagnosis not present

## 2022-07-29 DIAGNOSIS — H3561 Retinal hemorrhage, right eye: Secondary | ICD-10-CM | POA: Diagnosis not present

## 2022-07-29 DIAGNOSIS — H353122 Nonexudative age-related macular degeneration, left eye, intermediate dry stage: Secondary | ICD-10-CM | POA: Diagnosis not present

## 2022-07-29 DIAGNOSIS — H353211 Exudative age-related macular degeneration, right eye, with active choroidal neovascularization: Secondary | ICD-10-CM | POA: Diagnosis not present

## 2022-08-26 NOTE — Progress Notes (Unsigned)
    Cardiology Office Note  Date: 08/27/2022   ID: [name] [name], DOB 06/08/1940, MRN [id number]  History of Present Illness: [name] [name] is an 82 y.o. male last seen in January.  He is here for a routine visit.  He does not report any angina or nitroglycerin use, we discussed getting a refill for fresh bottle.  Does report dyspnea on exertion and some trouble with his balance, relatively stable and no falls.  I reviewed his medications, no changes in the interim from a cardiac perspective.  He did have lab work with Dr. Margo Aye in February which I also reviewed.  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  Physical Exam: VS:  BP 136/64   Pulse (!) 58   Ht 5\' 9"  (1.753 m)   Wt 178 lb 12.8 oz (81.1 kg)   SpO2 97%   BMI 26.40 kg/m , BMI Body mass index is 26.4 kg/m.  Wt Readings from Last 3 Encounters:  08/27/22 178 lb 12.8 oz (81.1 kg)  03/13/22 185 lb (83.9 kg)  09/10/21 185 lb (83.9 kg)    General: Patient appears comfortable at rest. HEENT: Conjunctiva and lids normal Neck: Supple, no elevated JVP or carotid bruits. Lungs: Clear to auscultation, nonlabored breathing at rest. Cardiac: Regular rate and rhythm, no S3 or significant systolic murmur. Extremities: No pitting edema.  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  Labwork:  February 2024: Hemoglobin 14.3, platelets 243, BUN 14, creatinine 0.92, potassium 5, AST 19, ALT 13, cholesterol 136, triglycerides 87, HDL 38, LDL 81, hemoglobin A1c 6.2%  Other Studies Reviewed Today:  No interval cardiac testing for review today.  Assessment and Plan:  1.  CAD status post stent to the LAD and diagonal in 2001, DES to the LAD and DES to the RCA as well as DES x 2 to the circumflex/OM in 2018.  LVEF 55 to 60% by echocardiogram in June 2022.  Refill provided for fresh bottle of nitroglycerin.  Plan to  continue aspirin, Plavix, Lipitor, and Micardis.  2.  Essential hypertension.  Blood pressure is reasonable today, no changes made to present regimen.  Keep follow-up with Dr. Margo Aye.  3.  Mixed hyperlipidemia.  LDL 81 in February.  Continue Lipitor.  Disposition:  Follow up  6 months.  Signed, Jonelle Sidle, M.D., F.A.C.C. [facility] at [facility]

## 2022-08-27 ENCOUNTER — Ambulatory Visit: Payer: PPO | Attending: Cardiology | Admitting: Cardiology

## 2022-08-27 ENCOUNTER — Encounter: Payer: Self-pay | Admitting: Cardiology

## 2022-08-27 VITALS — BP 136/64 | HR 58 | Ht 69.0 in | Wt 178.8 lb

## 2022-08-27 DIAGNOSIS — I10 Essential (primary) hypertension: Secondary | ICD-10-CM

## 2022-08-27 DIAGNOSIS — E782 Mixed hyperlipidemia: Secondary | ICD-10-CM

## 2022-08-27 DIAGNOSIS — I25119 Atherosclerotic heart disease of native coronary artery with unspecified angina pectoris: Secondary | ICD-10-CM

## 2022-08-27 MED ORDER — NITROGLYCERIN 0.4 MG SL SUBL: 0.4000 mg | SUBLINGUAL_TABLET | SUBLINGUAL | 3 refills | Status: AC | PRN

## 2022-08-27 NOTE — Patient Instructions (Addendum)
Medication Instructions:  Your physician recommends that you continue on your current medications as directed. Please refer to the Current Medication list given to you today.  Labwork: none  Testing/Procedures: none  Follow-Up: Your physician recommends that you schedule a follow-up appointment in: 6 months  Any Other Special Instructions Will Be Listed Below (If Applicable). Handicap application completed and provided today  If you need a refill on your cardiac medications before your next appointment, please call your pharmacy.

## 2022-09-03 DIAGNOSIS — H353211 Exudative age-related macular degeneration, right eye, with active choroidal neovascularization: Secondary | ICD-10-CM | POA: Diagnosis not present

## 2022-09-03 DIAGNOSIS — H353122 Nonexudative age-related macular degeneration, left eye, intermediate dry stage: Secondary | ICD-10-CM | POA: Diagnosis not present

## 2022-09-03 DIAGNOSIS — H35373 Puckering of macula, bilateral: Secondary | ICD-10-CM | POA: Diagnosis not present

## 2022-09-03 DIAGNOSIS — H3561 Retinal hemorrhage, right eye: Secondary | ICD-10-CM | POA: Diagnosis not present

## 2022-09-03 DIAGNOSIS — H43311 Vitreous membranes and strands, right eye: Secondary | ICD-10-CM | POA: Diagnosis not present

## 2022-10-07 DIAGNOSIS — H353211 Exudative age-related macular degeneration, right eye, with active choroidal neovascularization: Secondary | ICD-10-CM | POA: Diagnosis not present

## 2022-10-07 DIAGNOSIS — H35373 Puckering of macula, bilateral: Secondary | ICD-10-CM | POA: Diagnosis not present

## 2022-10-07 DIAGNOSIS — H3561 Retinal hemorrhage, right eye: Secondary | ICD-10-CM | POA: Diagnosis not present

## 2022-10-07 DIAGNOSIS — H353122 Nonexudative age-related macular degeneration, left eye, intermediate dry stage: Secondary | ICD-10-CM | POA: Diagnosis not present

## 2022-10-07 DIAGNOSIS — H43311 Vitreous membranes and strands, right eye: Secondary | ICD-10-CM | POA: Diagnosis not present

## 2022-10-24 DIAGNOSIS — E782 Mixed hyperlipidemia: Secondary | ICD-10-CM | POA: Diagnosis not present

## 2022-10-24 DIAGNOSIS — R7303 Prediabetes: Secondary | ICD-10-CM | POA: Diagnosis not present

## 2022-10-24 DIAGNOSIS — Z125 Encounter for screening for malignant neoplasm of prostate: Secondary | ICD-10-CM | POA: Diagnosis not present

## 2022-10-30 DIAGNOSIS — I10 Essential (primary) hypertension: Secondary | ICD-10-CM | POA: Diagnosis not present

## 2022-10-30 DIAGNOSIS — Z713 Dietary counseling and surveillance: Secondary | ICD-10-CM | POA: Diagnosis not present

## 2022-10-30 DIAGNOSIS — Z7982 Long term (current) use of aspirin: Secondary | ICD-10-CM | POA: Diagnosis not present

## 2022-10-30 DIAGNOSIS — Z955 Presence of coronary angioplasty implant and graft: Secondary | ICD-10-CM | POA: Diagnosis not present

## 2022-10-30 DIAGNOSIS — Z79899 Other long term (current) drug therapy: Secondary | ICD-10-CM | POA: Diagnosis not present

## 2022-10-30 DIAGNOSIS — H3530 Unspecified macular degeneration: Secondary | ICD-10-CM | POA: Diagnosis not present

## 2022-10-30 DIAGNOSIS — R1310 Dysphagia, unspecified: Secondary | ICD-10-CM | POA: Diagnosis not present

## 2022-10-30 DIAGNOSIS — Z6825 Body mass index (BMI) 25.0-25.9, adult: Secondary | ICD-10-CM | POA: Diagnosis not present

## 2022-10-30 DIAGNOSIS — I2510 Atherosclerotic heart disease of native coronary artery without angina pectoris: Secondary | ICD-10-CM | POA: Diagnosis not present

## 2022-10-30 DIAGNOSIS — R7303 Prediabetes: Secondary | ICD-10-CM | POA: Diagnosis not present

## 2022-10-30 DIAGNOSIS — E782 Mixed hyperlipidemia: Secondary | ICD-10-CM | POA: Diagnosis not present

## 2022-11-19 DIAGNOSIS — H353211 Exudative age-related macular degeneration, right eye, with active choroidal neovascularization: Secondary | ICD-10-CM | POA: Diagnosis not present

## 2022-11-19 DIAGNOSIS — H35373 Puckering of macula, bilateral: Secondary | ICD-10-CM | POA: Diagnosis not present

## 2022-11-19 DIAGNOSIS — H3561 Retinal hemorrhage, right eye: Secondary | ICD-10-CM | POA: Diagnosis not present

## 2022-11-19 DIAGNOSIS — H43311 Vitreous membranes and strands, right eye: Secondary | ICD-10-CM | POA: Diagnosis not present

## 2022-11-19 DIAGNOSIS — H353122 Nonexudative age-related macular degeneration, left eye, intermediate dry stage: Secondary | ICD-10-CM | POA: Diagnosis not present

## 2022-11-25 DIAGNOSIS — Z23 Encounter for immunization: Secondary | ICD-10-CM | POA: Diagnosis not present

## 2022-12-30 DIAGNOSIS — H353122 Nonexudative age-related macular degeneration, left eye, intermediate dry stage: Secondary | ICD-10-CM | POA: Diagnosis not present

## 2022-12-30 DIAGNOSIS — H3561 Retinal hemorrhage, right eye: Secondary | ICD-10-CM | POA: Diagnosis not present

## 2022-12-30 DIAGNOSIS — H353211 Exudative age-related macular degeneration, right eye, with active choroidal neovascularization: Secondary | ICD-10-CM | POA: Diagnosis not present

## 2022-12-30 DIAGNOSIS — H43311 Vitreous membranes and strands, right eye: Secondary | ICD-10-CM | POA: Diagnosis not present

## 2022-12-30 DIAGNOSIS — H35373 Puckering of macula, bilateral: Secondary | ICD-10-CM | POA: Diagnosis not present

## 2023-02-05 DIAGNOSIS — H35721 Serous detachment of retinal pigment epithelium, right eye: Secondary | ICD-10-CM | POA: Diagnosis not present

## 2023-02-05 DIAGNOSIS — H353211 Exudative age-related macular degeneration, right eye, with active choroidal neovascularization: Secondary | ICD-10-CM | POA: Diagnosis not present

## 2023-02-05 DIAGNOSIS — H3561 Retinal hemorrhage, right eye: Secondary | ICD-10-CM | POA: Diagnosis not present

## 2023-02-05 DIAGNOSIS — H353122 Nonexudative age-related macular degeneration, left eye, intermediate dry stage: Secondary | ICD-10-CM | POA: Diagnosis not present

## 2023-02-05 DIAGNOSIS — H35373 Puckering of macula, bilateral: Secondary | ICD-10-CM | POA: Diagnosis not present

## 2023-02-05 DIAGNOSIS — H43311 Vitreous membranes and strands, right eye: Secondary | ICD-10-CM | POA: Diagnosis not present

## 2023-03-02 ENCOUNTER — Ambulatory Visit: Payer: PPO | Admitting: Cardiology

## 2023-03-04 ENCOUNTER — Ambulatory Visit: Payer: PPO | Attending: Cardiology | Admitting: Cardiology

## 2023-03-04 ENCOUNTER — Encounter: Payer: Self-pay | Admitting: Cardiology

## 2023-03-04 VITALS — BP 128/60 | HR 54 | Ht 69.0 in | Wt 166.0 lb

## 2023-03-04 DIAGNOSIS — E782 Mixed hyperlipidemia: Secondary | ICD-10-CM | POA: Diagnosis not present

## 2023-03-04 DIAGNOSIS — I10 Essential (primary) hypertension: Secondary | ICD-10-CM

## 2023-03-04 DIAGNOSIS — I25119 Atherosclerotic heart disease of native coronary artery with unspecified angina pectoris: Secondary | ICD-10-CM

## 2023-03-04 NOTE — Patient Instructions (Addendum)

## 2023-03-04 NOTE — Progress Notes (Signed)
    Cardiology Office Note  Date: 03/04/2023   ID: [name] [name], DOB 11-Feb-1941, MRN [id number]  History of Present Illness: [name] [name] is an 83 y.o. male last seen in July 2024.  He is here for a follow-up visit.  He does not describe any angina, no interval nitroglycerin  use.  Remains functional with ADLs, no specific change in stamina.  I reviewed his medications.  Current cardiovascular regimen includes aspirin , Plavix , Lipitor, and Micardis.  He had lab work in August 2024 at which point LDL was down to 61.  Also states that he has cut out soft drinks and eating ice cream in the evenings, has lost nearly 20 pounds over the last year.  His blood pressure was up when he first came in, rechecked by me at 128/60.  Physical Exam: VS:  BP 128/60 (BP Location: Left Arm)   Pulse (!) 54   Ht 5' 9 (1.753 m)   Wt 166 lb (75.3 kg)   SpO2 98%   BMI 24.51 kg/m , BMI Body mass index is 24.51 kg/m.  Wt Readings from Last 3 Encounters:  03/04/23 166 lb (75.3 kg)  08/27/22 178 lb 12.8 oz (81.1 kg)  03/13/22 185 lb (83.9 kg)    General: Patient appears comfortable at rest. HEENT: Conjunctiva and lids normal. Neck: Supple, no elevated JVP or carotid bruits. Lungs: Clear to auscultation, nonlabored breathing at rest. Cardiac: Regular rate and rhythm, no S3 or significant systolic murmur. Extremities: No pitting edema.  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  Labwork:  August 2024: Hemoglobin 13.6, platelets 213, BUN 14, creatinine 0.86, potassium 4.4, AST 18, ALT 15, cholesterol 116, triglycerides 78, HDL 39, LDL 61, hemoglobin A1c 6%  Other Studies Reviewed Today:  No interval cardiac testing for review today.  Assessment and Plan:  1.  CAD status post stent to the LAD and diagonal in 2001, DES to the LAD and DES to the RCA as well as DES x 2 to the circumflex/OM in 2018.  LVEF 55 to 60%  by echocardiogram in June 2022.  He reports no angina or interval nitroglycerin  use.  Continue aspirin , Plavix , and Lipitor.  2.  Primary hypertension.  For now continue with current regimen including Micardis.  I asked him to check blood pressure periodically with cuff at home.   3.  Mixed hyperlipidemia.  LDL 61 in August 2024.  Continue Lipitor.  Disposition:  Follow up  6 months.  Signed, Jayson JUDITHANN Sierras, M.D., F.A.C.C. [facility] at [facility]

## 2023-03-11 DIAGNOSIS — H6993 Unspecified Eustachian tube disorder, bilateral: Secondary | ICD-10-CM | POA: Diagnosis not present

## 2023-03-16 DIAGNOSIS — H43311 Vitreous membranes and strands, right eye: Secondary | ICD-10-CM | POA: Diagnosis not present

## 2023-03-16 DIAGNOSIS — H35373 Puckering of macula, bilateral: Secondary | ICD-10-CM | POA: Diagnosis not present

## 2023-03-16 DIAGNOSIS — H353211 Exudative age-related macular degeneration, right eye, with active choroidal neovascularization: Secondary | ICD-10-CM | POA: Diagnosis not present

## 2023-03-16 DIAGNOSIS — H353122 Nonexudative age-related macular degeneration, left eye, intermediate dry stage: Secondary | ICD-10-CM | POA: Diagnosis not present

## 2023-03-16 DIAGNOSIS — H3561 Retinal hemorrhage, right eye: Secondary | ICD-10-CM | POA: Diagnosis not present

## 2023-03-16 DIAGNOSIS — H35721 Serous detachment of retinal pigment epithelium, right eye: Secondary | ICD-10-CM | POA: Diagnosis not present

## 2023-04-23 DIAGNOSIS — H3561 Retinal hemorrhage, right eye: Secondary | ICD-10-CM | POA: Diagnosis not present

## 2023-04-23 DIAGNOSIS — H43311 Vitreous membranes and strands, right eye: Secondary | ICD-10-CM | POA: Diagnosis not present

## 2023-04-23 DIAGNOSIS — H35373 Puckering of macula, bilateral: Secondary | ICD-10-CM | POA: Diagnosis not present

## 2023-04-23 DIAGNOSIS — H353211 Exudative age-related macular degeneration, right eye, with active choroidal neovascularization: Secondary | ICD-10-CM | POA: Diagnosis not present

## 2023-04-23 DIAGNOSIS — H353122 Nonexudative age-related macular degeneration, left eye, intermediate dry stage: Secondary | ICD-10-CM | POA: Diagnosis not present

## 2023-04-23 DIAGNOSIS — H35721 Serous detachment of retinal pigment epithelium, right eye: Secondary | ICD-10-CM | POA: Diagnosis not present

## 2023-04-27 DIAGNOSIS — I10 Essential (primary) hypertension: Secondary | ICD-10-CM | POA: Diagnosis not present

## 2023-04-27 DIAGNOSIS — R7303 Prediabetes: Secondary | ICD-10-CM | POA: Diagnosis not present

## 2023-04-30 ENCOUNTER — Encounter: Payer: Self-pay | Admitting: Internal Medicine

## 2023-04-30 DIAGNOSIS — R7303 Prediabetes: Secondary | ICD-10-CM | POA: Diagnosis not present

## 2023-04-30 DIAGNOSIS — R1310 Dysphagia, unspecified: Secondary | ICD-10-CM | POA: Diagnosis not present

## 2023-04-30 DIAGNOSIS — I10 Essential (primary) hypertension: Secondary | ICD-10-CM | POA: Diagnosis not present

## 2023-04-30 DIAGNOSIS — Z7182 Exercise counseling: Secondary | ICD-10-CM | POA: Diagnosis not present

## 2023-04-30 DIAGNOSIS — Z79899 Other long term (current) drug therapy: Secondary | ICD-10-CM | POA: Diagnosis not present

## 2023-04-30 DIAGNOSIS — I2510 Atherosclerotic heart disease of native coronary artery without angina pectoris: Secondary | ICD-10-CM | POA: Diagnosis not present

## 2023-04-30 DIAGNOSIS — Z6825 Body mass index (BMI) 25.0-25.9, adult: Secondary | ICD-10-CM | POA: Diagnosis not present

## 2023-04-30 DIAGNOSIS — Z713 Dietary counseling and surveillance: Secondary | ICD-10-CM | POA: Diagnosis not present

## 2023-04-30 DIAGNOSIS — H3530 Unspecified macular degeneration: Secondary | ICD-10-CM | POA: Diagnosis not present

## 2023-04-30 DIAGNOSIS — E782 Mixed hyperlipidemia: Secondary | ICD-10-CM | POA: Diagnosis not present

## 2023-04-30 DIAGNOSIS — Z0001 Encounter for general adult medical examination with abnormal findings: Secondary | ICD-10-CM | POA: Diagnosis not present

## 2023-05-18 DIAGNOSIS — D0439 Carcinoma in situ of skin of other parts of face: Secondary | ICD-10-CM | POA: Diagnosis not present

## 2023-05-18 DIAGNOSIS — L820 Inflamed seborrheic keratosis: Secondary | ICD-10-CM | POA: Diagnosis not present

## 2023-05-21 DIAGNOSIS — H35373 Puckering of macula, bilateral: Secondary | ICD-10-CM | POA: Diagnosis not present

## 2023-05-21 DIAGNOSIS — H3561 Retinal hemorrhage, right eye: Secondary | ICD-10-CM | POA: Diagnosis not present

## 2023-05-21 DIAGNOSIS — H35721 Serous detachment of retinal pigment epithelium, right eye: Secondary | ICD-10-CM | POA: Diagnosis not present

## 2023-05-21 DIAGNOSIS — H353211 Exudative age-related macular degeneration, right eye, with active choroidal neovascularization: Secondary | ICD-10-CM | POA: Diagnosis not present

## 2023-05-21 DIAGNOSIS — H353122 Nonexudative age-related macular degeneration, left eye, intermediate dry stage: Secondary | ICD-10-CM | POA: Diagnosis not present

## 2023-05-21 DIAGNOSIS — H43311 Vitreous membranes and strands, right eye: Secondary | ICD-10-CM | POA: Diagnosis not present

## 2023-05-27 DIAGNOSIS — H353122 Nonexudative age-related macular degeneration, left eye, intermediate dry stage: Secondary | ICD-10-CM | POA: Diagnosis not present

## 2023-05-27 DIAGNOSIS — H35721 Serous detachment of retinal pigment epithelium, right eye: Secondary | ICD-10-CM | POA: Diagnosis not present

## 2023-05-27 DIAGNOSIS — H353211 Exudative age-related macular degeneration, right eye, with active choroidal neovascularization: Secondary | ICD-10-CM | POA: Diagnosis not present

## 2023-05-27 DIAGNOSIS — H35373 Puckering of macula, bilateral: Secondary | ICD-10-CM | POA: Diagnosis not present

## 2023-05-27 DIAGNOSIS — H3561 Retinal hemorrhage, right eye: Secondary | ICD-10-CM | POA: Diagnosis not present

## 2023-05-27 DIAGNOSIS — H43311 Vitreous membranes and strands, right eye: Secondary | ICD-10-CM | POA: Diagnosis not present

## 2023-06-16 DIAGNOSIS — Q828 Other specified congenital malformations of skin: Secondary | ICD-10-CM | POA: Diagnosis not present

## 2023-06-16 DIAGNOSIS — L909 Atrophic disorder of skin, unspecified: Secondary | ICD-10-CM | POA: Diagnosis not present

## 2023-06-16 DIAGNOSIS — M7741 Metatarsalgia, right foot: Secondary | ICD-10-CM | POA: Diagnosis not present

## 2023-06-16 DIAGNOSIS — M79671 Pain in right foot: Secondary | ICD-10-CM | POA: Diagnosis not present

## 2023-06-17 ENCOUNTER — Ambulatory Visit: Payer: PPO | Admitting: Cardiology

## 2023-06-23 DIAGNOSIS — H35372 Puckering of macula, left eye: Secondary | ICD-10-CM | POA: Diagnosis not present

## 2023-06-23 DIAGNOSIS — H353211 Exudative age-related macular degeneration, right eye, with active choroidal neovascularization: Secondary | ICD-10-CM | POA: Diagnosis not present

## 2023-06-23 DIAGNOSIS — H35721 Serous detachment of retinal pigment epithelium, right eye: Secondary | ICD-10-CM | POA: Diagnosis not present

## 2023-07-15 DIAGNOSIS — L258 Unspecified contact dermatitis due to other agents: Secondary | ICD-10-CM | POA: Diagnosis not present

## 2023-07-15 DIAGNOSIS — Z08 Encounter for follow-up examination after completed treatment for malignant neoplasm: Secondary | ICD-10-CM | POA: Diagnosis not present

## 2023-07-15 DIAGNOSIS — Z85828 Personal history of other malignant neoplasm of skin: Secondary | ICD-10-CM | POA: Diagnosis not present

## 2023-07-15 DIAGNOSIS — B078 Other viral warts: Secondary | ICD-10-CM | POA: Diagnosis not present

## 2023-08-11 DIAGNOSIS — H353122 Nonexudative age-related macular degeneration, left eye, intermediate dry stage: Secondary | ICD-10-CM | POA: Diagnosis not present

## 2023-08-11 DIAGNOSIS — H35372 Puckering of macula, left eye: Secondary | ICD-10-CM | POA: Diagnosis not present

## 2023-08-11 DIAGNOSIS — H353211 Exudative age-related macular degeneration, right eye, with active choroidal neovascularization: Secondary | ICD-10-CM | POA: Diagnosis not present

## 2023-08-11 DIAGNOSIS — H35371 Puckering of macula, right eye: Secondary | ICD-10-CM | POA: Diagnosis not present

## 2023-08-11 DIAGNOSIS — H35721 Serous detachment of retinal pigment epithelium, right eye: Secondary | ICD-10-CM | POA: Diagnosis not present

## 2023-08-11 DIAGNOSIS — H3561 Retinal hemorrhage, right eye: Secondary | ICD-10-CM | POA: Diagnosis not present

## 2023-08-11 DIAGNOSIS — H43311 Vitreous membranes and strands, right eye: Secondary | ICD-10-CM | POA: Diagnosis not present

## 2023-09-14 ENCOUNTER — Encounter: Payer: Self-pay | Admitting: Cardiology

## 2023-09-14 ENCOUNTER — Ambulatory Visit: Attending: Cardiology | Admitting: Cardiology

## 2023-09-14 VITALS — BP 138/70 | HR 56 | Ht 69.0 in | Wt 165.4 lb

## 2023-09-14 DIAGNOSIS — I10 Essential (primary) hypertension: Secondary | ICD-10-CM

## 2023-09-14 DIAGNOSIS — I25119 Atherosclerotic heart disease of native coronary artery with unspecified angina pectoris: Secondary | ICD-10-CM

## 2023-09-14 DIAGNOSIS — E782 Mixed hyperlipidemia: Secondary | ICD-10-CM

## 2023-09-14 NOTE — Progress Notes (Signed)
    Cardiology Office Note  Date: 09/14/2023   ID: [name] [name], DOB 1940-03-20, MRN [id number]  History of Present Illness: [name] [name] is an 83 y.o. male last seen in January.  He is here today for a routine visit.  Reports no angina or interval nitroglycerin  use, no change in stamina.  No increasing dyspnea with typical activities, no palpitations or syncope.  We went over his medications.  He reports compliance with current regimen.  I did review home blood pressure checks over the last week, average control looks good.  I went over his interval lab work as well, LDL 70 in March.  I reviewed his ECG today which shows sinus bradycardia, rule out old anterior infarct pattern.  Physical Exam: VS:  BP 138/70   Pulse (!) 56   Ht 5' 9 (1.753 m)   Wt 165 lb 6.4 oz (75 kg)   SpO2 98%   BMI 24.43 kg/m , BMI Body mass index is 24.43 kg/m.  Wt Readings from Last 3 Encounters:  09/14/23 165 lb 6.4 oz (75 kg)  03/04/23 166 lb (75.3 kg)  08/27/22 178 lb 12.8 oz (81.1 kg)    General: Patient appears comfortable at rest. HEENT: Conjunctiva and lids normal. Neck: Supple, no elevated JVP or carotid bruits. Lungs: Clear to auscultation, nonlabored breathing at rest. Cardiac: Regular rate and rhythm, no S3, 1/6 systolic murmur. Extremities: No pitting edema.  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  Labwork:  March 2025: Hemoglobin 13.4, platelets 212, BUN 13, creatinine 0.83, potassium 4.6, AST 13, ALT 10, GFR 86, cholesterol 125, triglycerides 78, HDL 39, LDL 70, hemoglobin A1c 6%  Other Studies Reviewed Today:  No interval cardiac testing for review today.  Assessment and Plan:  1.  CAD status post stent to the LAD and diagonal in 2001, DES to the LAD and DES to the RCA as well as DES x 2 to the circumflex/OM in 2018.  LVEF 55 to 60% by echocardiogram in June 2022.  ECG reviewed and  stable.  He reports no angina or interval nitroglycerin  use.  Continue medical therapy including aspirin  81 mg daily, Plavix  75 mg daily, Lipitor 40 mg daily, and as needed nitroglycerin .  2.  Primary hypertension.  Continue Micardis 80 mg daily.  He will track blood pressure at home periodically.   3.  Mixed hyperlipidemia.  LDL 70 in March.  Continue Lipitor 40 mg daily.  Disposition:  Follow up 6 months.  Signed, Jayson JUDITHANN Sierras, M.D., F.A.C.C. [facility]

## 2023-09-14 NOTE — Patient Instructions (Signed)

## 2023-09-30 DIAGNOSIS — H35721 Serous detachment of retinal pigment epithelium, right eye: Secondary | ICD-10-CM | POA: Diagnosis not present

## 2023-09-30 DIAGNOSIS — H35373 Puckering of macula, bilateral: Secondary | ICD-10-CM | POA: Diagnosis not present

## 2023-09-30 DIAGNOSIS — H43311 Vitreous membranes and strands, right eye: Secondary | ICD-10-CM | POA: Diagnosis not present

## 2023-09-30 DIAGNOSIS — H3561 Retinal hemorrhage, right eye: Secondary | ICD-10-CM | POA: Diagnosis not present

## 2023-09-30 DIAGNOSIS — H353122 Nonexudative age-related macular degeneration, left eye, intermediate dry stage: Secondary | ICD-10-CM | POA: Diagnosis not present

## 2023-09-30 DIAGNOSIS — H353211 Exudative age-related macular degeneration, right eye, with active choroidal neovascularization: Secondary | ICD-10-CM | POA: Diagnosis not present

## 2023-10-27 DIAGNOSIS — E782 Mixed hyperlipidemia: Secondary | ICD-10-CM | POA: Diagnosis not present

## 2023-10-27 DIAGNOSIS — R7303 Prediabetes: Secondary | ICD-10-CM | POA: Diagnosis not present

## 2023-11-02 ENCOUNTER — Encounter: Payer: Self-pay | Admitting: Internal Medicine

## 2023-11-02 DIAGNOSIS — E782 Mixed hyperlipidemia: Secondary | ICD-10-CM | POA: Diagnosis not present

## 2023-11-02 DIAGNOSIS — R1310 Dysphagia, unspecified: Secondary | ICD-10-CM | POA: Diagnosis not present

## 2023-11-02 DIAGNOSIS — R7303 Prediabetes: Secondary | ICD-10-CM | POA: Diagnosis not present

## 2023-11-02 DIAGNOSIS — Z713 Dietary counseling and surveillance: Secondary | ICD-10-CM | POA: Diagnosis not present

## 2023-11-02 DIAGNOSIS — Z2821 Immunization not carried out because of patient refusal: Secondary | ICD-10-CM | POA: Diagnosis not present

## 2023-11-02 DIAGNOSIS — Z7902 Long term (current) use of antithrombotics/antiplatelets: Secondary | ICD-10-CM | POA: Diagnosis not present

## 2023-11-02 DIAGNOSIS — H3530 Unspecified macular degeneration: Secondary | ICD-10-CM | POA: Diagnosis not present

## 2023-11-02 DIAGNOSIS — Z7982 Long term (current) use of aspirin: Secondary | ICD-10-CM | POA: Diagnosis not present

## 2023-11-02 DIAGNOSIS — Z79899 Other long term (current) drug therapy: Secondary | ICD-10-CM | POA: Diagnosis not present

## 2023-11-02 DIAGNOSIS — I2510 Atherosclerotic heart disease of native coronary artery without angina pectoris: Secondary | ICD-10-CM | POA: Diagnosis not present

## 2023-11-02 DIAGNOSIS — I10 Essential (primary) hypertension: Secondary | ICD-10-CM | POA: Diagnosis not present

## 2023-11-17 DIAGNOSIS — H353122 Nonexudative age-related macular degeneration, left eye, intermediate dry stage: Secondary | ICD-10-CM | POA: Diagnosis not present

## 2023-11-17 DIAGNOSIS — H3561 Retinal hemorrhage, right eye: Secondary | ICD-10-CM | POA: Diagnosis not present

## 2023-11-17 DIAGNOSIS — H353211 Exudative age-related macular degeneration, right eye, with active choroidal neovascularization: Secondary | ICD-10-CM | POA: Diagnosis not present

## 2023-11-17 DIAGNOSIS — H35372 Puckering of macula, left eye: Secondary | ICD-10-CM | POA: Diagnosis not present

## 2023-11-17 DIAGNOSIS — H35721 Serous detachment of retinal pigment epithelium, right eye: Secondary | ICD-10-CM | POA: Diagnosis not present

## 2023-11-17 DIAGNOSIS — H43311 Vitreous membranes and strands, right eye: Secondary | ICD-10-CM | POA: Diagnosis not present

## 2023-11-17 DIAGNOSIS — H35371 Puckering of macula, right eye: Secondary | ICD-10-CM | POA: Diagnosis not present

## 2023-12-01 DIAGNOSIS — Z23 Encounter for immunization: Secondary | ICD-10-CM | POA: Diagnosis not present

## 2024-01-12 DIAGNOSIS — H35372 Puckering of macula, left eye: Secondary | ICD-10-CM | POA: Diagnosis not present

## 2024-01-12 DIAGNOSIS — H353211 Exudative age-related macular degeneration, right eye, with active choroidal neovascularization: Secondary | ICD-10-CM | POA: Diagnosis not present

## 2024-01-12 DIAGNOSIS — H35371 Puckering of macula, right eye: Secondary | ICD-10-CM | POA: Diagnosis not present

## 2024-01-12 DIAGNOSIS — H35721 Serous detachment of retinal pigment epithelium, right eye: Secondary | ICD-10-CM | POA: Diagnosis not present

## 2024-01-12 DIAGNOSIS — H43311 Vitreous membranes and strands, right eye: Secondary | ICD-10-CM | POA: Diagnosis not present

## 2024-01-12 DIAGNOSIS — H353122 Nonexudative age-related macular degeneration, left eye, intermediate dry stage: Secondary | ICD-10-CM | POA: Diagnosis not present

## 2024-01-12 DIAGNOSIS — H3561 Retinal hemorrhage, right eye: Secondary | ICD-10-CM | POA: Diagnosis not present

## 2024-04-05 ENCOUNTER — Ambulatory Visit: Admitting: Cardiology
